# Patient Record
Sex: Female | Born: 1950 | Race: White | Hispanic: No | Smoking: Former smoker
Health system: Southern US, Community
[De-identification: ages and names within clinical notes are randomized; demographics above are authoritative.]

## PROBLEM LIST (undated history)

## (undated) DIAGNOSIS — I639 Cerebral infarction, unspecified: Secondary | ICD-10-CM

## (undated) DIAGNOSIS — E559 Vitamin D deficiency, unspecified: Secondary | ICD-10-CM

## (undated) DIAGNOSIS — I5189 Other ill-defined heart diseases: Secondary | ICD-10-CM

## (undated) DIAGNOSIS — K219 Gastro-esophageal reflux disease without esophagitis: Secondary | ICD-10-CM

## (undated) DIAGNOSIS — J449 Chronic obstructive pulmonary disease, unspecified: Secondary | ICD-10-CM

## (undated) DIAGNOSIS — J45909 Unspecified asthma, uncomplicated: Secondary | ICD-10-CM

## (undated) DIAGNOSIS — I10 Essential (primary) hypertension: Secondary | ICD-10-CM

## (undated) DIAGNOSIS — H269 Unspecified cataract: Secondary | ICD-10-CM

## (undated) DIAGNOSIS — F17200 Nicotine dependence, unspecified, uncomplicated: Secondary | ICD-10-CM

## (undated) DIAGNOSIS — F0390 Unspecified dementia without behavioral disturbance: Secondary | ICD-10-CM

## (undated) HISTORY — DX: Vitamin D deficiency, unspecified: E55.9

## (undated) HISTORY — PX: ABDOMINAL HYSTERECTOMY: SHX81

## (undated) HISTORY — DX: Gastro-esophageal reflux disease without esophagitis: K21.9

## (undated) HISTORY — PX: PARTIAL HYMENECTOMY: SHX327

## (undated) HISTORY — DX: Cerebral infarction, unspecified: I63.9

## (undated) HISTORY — DX: Unspecified cataract: H26.9

---

## 2002-03-25 ENCOUNTER — Encounter: Payer: Self-pay | Admitting: Emergency Medicine

## 2002-03-25 ENCOUNTER — Inpatient Hospital Stay (HOSPITAL_COMMUNITY): Admission: EM | Admit: 2002-03-25 | Discharge: 2002-03-27 | Payer: Self-pay | Admitting: Emergency Medicine

## 2004-01-12 ENCOUNTER — Emergency Department: Payer: Self-pay | Admitting: General Practice

## 2011-01-06 ENCOUNTER — Inpatient Hospital Stay: Payer: Self-pay | Admitting: Surgery

## 2011-01-16 ENCOUNTER — Inpatient Hospital Stay: Payer: Self-pay | Admitting: Surgery

## 2011-01-19 LAB — PATHOLOGY REPORT

## 2011-02-02 HISTORY — PX: HERNIA REPAIR: SHX51

## 2011-08-21 ENCOUNTER — Emergency Department: Payer: Self-pay | Admitting: Emergency Medicine

## 2012-09-06 ENCOUNTER — Emergency Department: Payer: Self-pay | Admitting: Emergency Medicine

## 2014-05-26 NOTE — Consult Note (Signed)
PATIENT NAME:  Lauren Koch, Lauren Koch MR#:  [id number] DATE OF BIRTH:  Sep 17, 1950  DATE OF CONSULTATION:  01/06/2011  REFERRING PHYSICIAN:  Dr. Cecelia ByarsHashmi  CONSULTING PHYSICIAN:  Fredia SorrowAbhinav Teigen Parslow, MD  PRIMARY CARE PHYSICIAN: None.   REASON FOR CONSULTATION: Preop assessment and management of hypertension and wheezing.   CHIEF COMPLAINT: "I had a funny feeling in my stomach."   HISTORY OF PRESENT ILLNESS: 64 year old female who is a smoker, smoking a pack per day for more than 30 years, history of asthma, chronic obstructive pulmonary disease, not using any inhalers at home, also history of hypertension, not using any medication, has not seen a physician for many years now; previously she was seeing Dr. Alphonsus SiasLetvak. Today she presented to the Emergency Room because of abdominal pain. She says it is mainly in the lower abdomen on the left side. It feels like a knot. She had a hernia for a long time but she had a funny feeling in the abdomen. She was also nauseous, dry heaving, but she did not vomit. She had a bowel movement today. She was having significant pain in her abdomen. She said she coughs all the time because of smoking. She also has wheezing and she says she is wheezing pretty much all the time. She has shortness of breath on exertion but she denies any chest pain, any headache, any dizziness. When she came into the Emergency Room she had a blood pressure of 245/107 and she was saturating 90% on room air. She had audible wheezing. She received a DuoNeb in the Emergency Room. She got one dose of IV labetalol also and she got 1 liter of normal saline bolus. She feels extremely dry. Dr. Cecelia ByarsHashmi saw her in the Emergency Room and she is being admitted under surgery for incarcerated hernia. He tried to reduce the hernia in the Emergency Room. Patient is to be kept n.p.o. because patient might need to go to the Operating Room tonight.  REVIEW OF SYSTEMS: She denies any fever, chills, or weakness. No acute change in  vision. No headache. No dizziness. Complaining of cough, dyspnea, chronic obstructive pulmonary disease. No pleuritic chest pain. No chest pain. She has dyspnea on exertion. No palpitations or syncope. She is complaining of nausea, abdominal pain. No GI bleed. No dysuria. No frequency. No anemia. No easy bruisability. No bleeding from any site. No thyroid problems. No rash. No joint pains. No swelling. No focal numbness or weakness. No anxiety.   PAST MEDICAL HISTORY:  1. Hypertension. 2. Asthma. 3. Chronic obstructive pulmonary disease. 4. No cardiac history in the past. She used to see Dr. Alphonsus SiasLetvak in the past but she has not seen him for many years.  HOME MEDICATIONS: None. Previously she was using albuterol and Advair in the past.   PAST SURGICAL HISTORY:  1. Cesarean section.  2. She had one of the ovaries removed, she thinks it is a right oophorectomy.   ALLERGIES TO MEDICATIONS: Prednisone.   SOCIAL HISTORY: She [place] in [place]. She smokes about a pack per day for more than 30 years. No alcohol or drug use.   FAMILY HISTORY: Her dad had myocardial infarction. Mother had heart disease also.   PHYSICAL EXAMINATION:  VITAL SIGNS: Her vitals when she presented to the Emergency Room: Temperature 97.8, heart rate 79, respiratory rate 18, blood pressure 245/107, saturating 90% on room air. Currently, heart rate is 70, blood pressure 177/88, saturating 97% on 2 liters nasal cannula.   GENERAL: This is a  middle-aged morbidly obese Caucasian female. She is lying in bed having audible wheezing at this time.   HEENT: Bilateral pupils are equal. Extraocular muscles are intact. No scleral icterus. No conjunctivitis. Oral mucosa completely dry. She has halitosis.   NECK: No thyroid tenderness, enlargement or nodules. Neck is supple. No masses, nontender. No adenopathy. No JVD. No carotid bruit.   CHEST: She has bilateral wheezing, audible wheezing, increased respiratory  effort. Not using accessory muscles of respiration.   HEART: Heart sounds are regular. No murmur. Peripheral pulses 1+. She has nonpitting edema.   ABDOMEN: Slightly distended. She has a ventral hernia in the left side. I could not reduce it. This is on the left of the scar. She has a scar from previous cesarean section in lower abdomen infraumbilical. She has tinkling bowel sounds, hypoactive. No guarding. No rigidity.   NEUROLOGIC: She is awake, alert, oriented to time, place, and person. Cranial nerves are intact. Moving all extremities against gravity.   EXTREMITIES: No cyanosis. No clubbing.   SKIN: No rash. No lesions.   LABORATORY, DIAGNOSTIC AND RADIOLOGICAL DATA: White count 8.9, hemoglobin 15.4, platelet count 256,000. BMP: Sodium 142, potassium 3.9, BUN 11, creatinine 0.83. LFTs are normal. Troponin negative. Lipase 62. Urinalysis shows negative nitrite, negative leukocyte esterase. Chest x-ray is negative; it shows hyperinflated lung fields. Her EKG shows that she has sinus rhythm, she has normal axis, nonspecific T wave changes.   IMPRESSION:  1. Preoperative assessment. 2. Chronic obstructive pulmonary disease exacerbation. 3. Malignant hypertension. 4. Tobacco abuse.  5. Noncompliance. 6. Incarcerated ventral hernia.   PLAN: A 64 year old female, morbidly obese, with history of hypertension, asthma, chronic obstructive pulmonary disease, she smokes about a pack per day. She presented with abdominal pain, found to have an incarcerated hernia. She is admitted by surgery. She may need surgical intervention tonight. She smokes about a pack per day. She is extensively wheezing. I am going to start her on DuoNeb every four hours. I am going to start her on IV Solu-Medrol though she said she is allergic to prednisone but considering her chronic obstructive pulmonary disease exacerbation she needs some Solu-Medrol at this time. She got one dose of 10 mg of IV labetalol in the Emergency  Room. I am going to put her on hydralazine IV p.r.n. Will avoid beta blockers at this time because of her severe wheezing. Her EKG does not show any acute ischemia, nonspecific T wave changes. No prior EKG to compare with. I advised her smoking cessation. She is deconditioned. She is not taking any medications at home. She has not seen a physician for many years. She may be needing urgent surgery but she is high cardiac risk for surgery. Will continue to follow the patient.  TIME SPENT WITH CONSULTATION: 50 minutes.   ____________________________ Fredia Sorrow, MD ag:cms Koch: 01/06/2011 20:26:31 ET T: 01/07/2011 10:35:03 ET JOB#: [id number]  cc: Fredia Sorrow, MD, <Dictator> Mark A. Egbert Garibaldi, MD  Fredia Sorrow MD ELECTRONICALLY SIGNED 02/06/2011 17:15

## 2014-05-26 NOTE — Discharge Summary (Signed)
PATIENT NAME:  Lauren Koch, Lauren Koch MR#:  [id number] DATE OF BIRTH:  [birth year]  DATE OF ADMISSION:  01/16/2011 DATE OF DISCHARGE:  01/19/2011  FINAL DIAGNOSES:  1. Incarcerated ventral hernia.  2. Obesity.  3. Severe asthma and chronic obstructive pulmonary disease.  4. Tobacco abuse and dependence.   PRINCIPLE PROCEDURES:  1. CT scan of the abdomen and pelvis.  2. Medicine consultation featuring [facility].  3. Repair of incarcerated ventral hernia with subfascial mesh and partial omentectomy on 01/17/2011.   HOSPITAL COURSE SUMMARY: The patient was admitted with recurrent incarceration of the ventral hernia. Dr. Anda KraftMarterre was able to successfully reduce the bowel component of this. Repeat CT scan demonstrated a large amount of omentum trapped within a large hernia sac with a small fascial defect. After appropriate medical consultation and optimization of her asthma and chronic obstructive pulmonary disease and preoperative evaluation informed consent, the patient was brought to the Operating Room on the 16th of December and the hernia was repaired with mesh. Postoperatively she had adequate pain control with intermittent wheezing, tolerating a regular diet. Her abdomen remained soft. Pain was well controlled. She was discharged home on postoperative day number two in stable condition with office follow-up in one week's time for staple removal. She is to continue her abdominal binder.   DISCHARGE MEDICATIONS:  1. Advair Diskus.  2. Norvasc 10 mg by mouth once a day.  3. Spiriva. 4. Dexamethasone 4 mg by mouth taper. 5. Norco 5/325 1 to 2 tabs p.o. every 4 to 6 hours p.r.n. pain.     DISCHARGE INSTRUCTIONS: Call the office with any questions or concerns.    ____________________________ Redge GainerMark A. Egbert GaribaldiBird, MD mab:rbg Koch: 02/01/2011 22:09:25 ET T: 02/03/2011 15:40:11 ET JOB#: [id number]  cc: Loraine LericheMark A. Egbert GaribaldiBird, MD, <Dictator> Jamell Opfer Kela MillinA Donnajean Chesnut MD ELECTRONICALLY SIGNED 02/04/2011 8:35

## 2014-06-08 ENCOUNTER — Emergency Department (HOSPITAL_COMMUNITY): Payer: Self-pay

## 2014-06-08 ENCOUNTER — Encounter (HOSPITAL_COMMUNITY): Payer: Self-pay | Admitting: Emergency Medicine

## 2014-06-08 ENCOUNTER — Emergency Department (HOSPITAL_COMMUNITY)
Admission: EM | Admit: 2014-06-08 | Discharge: 2014-06-08 | Disposition: A | Discharge status: Home / Self Care | Payer: Self-pay | Attending: Emergency Medicine | Admitting: Emergency Medicine

## 2014-06-08 DIAGNOSIS — J441 Chronic obstructive pulmonary disease with (acute) exacerbation: Secondary | ICD-10-CM | POA: Insufficient documentation

## 2014-06-08 DIAGNOSIS — J209 Acute bronchitis, unspecified: Secondary | ICD-10-CM | POA: Insufficient documentation

## 2014-06-08 DIAGNOSIS — I10 Essential (primary) hypertension: Secondary | ICD-10-CM | POA: Insufficient documentation

## 2014-06-08 DIAGNOSIS — Z720 Tobacco use: Secondary | ICD-10-CM | POA: Insufficient documentation

## 2014-06-08 DIAGNOSIS — Z79899 Other long term (current) drug therapy: Secondary | ICD-10-CM | POA: Insufficient documentation

## 2014-06-08 DIAGNOSIS — J40 Bronchitis, not specified as acute or chronic: Secondary | ICD-10-CM

## 2014-06-08 HISTORY — DX: Chronic obstructive pulmonary disease, unspecified: J44.9

## 2014-06-08 HISTORY — DX: Unspecified asthma, uncomplicated: J45.909

## 2014-06-08 HISTORY — DX: Essential (primary) hypertension: I10

## 2014-06-08 LAB — COMPREHENSIVE METABOLIC PANEL
ALBUMIN: 3.7 g/dL (ref 3.5–5.0)
ALK PHOS: 106 U/L (ref 38–126)
ALT: 13 U/L — ABNORMAL LOW (ref 14–54)
AST: 20 U/L (ref 15–41)
Anion gap: 11 (ref 5–15)
BILIRUBIN TOTAL: 0.5 mg/dL (ref 0.3–1.2)
BUN: 8 mg/dL (ref 6–20)
CO2: 25 mmol/L (ref 22–32)
Calcium: 9 mg/dL (ref 8.9–10.3)
Chloride: 103 mmol/L (ref 101–111)
Creatinine, Ser: 0.79 mg/dL (ref 0.44–1.00)
GFR calc Af Amer: >60 mL/min (ref >60)
Glucose, Bld: 111 mg/dL — ABNORMAL HIGH (ref 70–99)
POTASSIUM: 3.7 mmol/L (ref 3.5–5.1)
Sodium: 139 mmol/L (ref 135–145)
Total Protein: 7.7 g/dL (ref 6.5–8.1)

## 2014-06-08 LAB — CBC WITH DIFFERENTIAL/PLATELET
Basophils Absolute: 0 10*3/uL (ref 0.0–0.1)
Basophils Relative: 0 % (ref 0–1)
EOS PCT: 2 % (ref 0–5)
Eosinophils Absolute: 0.2 10*3/uL (ref 0.0–0.7)
HCT: 38.1 % (ref 36.0–46.0)
Hemoglobin: 12 g/dL (ref 12.0–15.0)
Lymphocytes Relative: 13 % (ref 12–46)
Lymphs Abs: 1.5 10*3/uL (ref 0.7–4.0)
MCH: 24 pg — ABNORMAL LOW (ref 26.0–34.0)
MCHC: 31.5 g/dL (ref 30.0–36.0)
MCV: 76.4 fL — ABNORMAL LOW (ref 78.0–100.0)
MONO ABS: 1.1 10*3/uL — AB (ref 0.1–1.0)
Monocytes Relative: 10 % (ref 3–12)
NEUTROS ABS: 8.1 10*3/uL — AB (ref 1.7–7.7)
Neutrophils Relative %: 75 % (ref 43–77)
Platelets: 377 10*3/uL (ref 150–400)
RBC: 4.99 MIL/uL (ref 3.87–5.11)
RDW: 16.6 % — ABNORMAL HIGH (ref 11.5–15.5)
WBC: 10.9 10*3/uL — ABNORMAL HIGH (ref 4.0–10.5)

## 2014-06-08 LAB — BRAIN NATRIURETIC PEPTIDE: B Natriuretic Peptide: 109 pg/mL — ABNORMAL HIGH (ref 0.0–100.0)

## 2014-06-08 LAB — TROPONIN I

## 2014-06-08 MED ORDER — ALBUTEROL SULFATE (2.5 MG/3ML) 0.083% IN NEBU
2.5000 mg | INHALATION_SOLUTION | Freq: Once | RESPIRATORY_TRACT | Status: AC
  Administered 2014-06-08: 2.5 mg via RESPIRATORY_TRACT
  Filled 2014-06-08: qty 3

## 2014-06-08 MED ORDER — IPRATROPIUM-ALBUTEROL 0.5-2.5 (3) MG/3ML IN SOLN
3.0000 mL | Freq: Once | RESPIRATORY_TRACT | Status: AC
  Administered 2014-06-08: 3 mL via RESPIRATORY_TRACT
  Filled 2014-06-08: qty 3

## 2014-06-08 MED ORDER — ALBUTEROL SULFATE HFA 108 (90 BASE) MCG/ACT IN AERS
2.0000 | INHALATION_SPRAY | RESPIRATORY_TRACT | Status: DC | PRN
  Administered 2014-06-08: 2 via RESPIRATORY_TRACT
  Filled 2014-06-08: qty 6.7

## 2014-06-08 MED ORDER — AZITHROMYCIN 250 MG PO TABS
500.0000 mg | ORAL_TABLET | Freq: Once | ORAL | Status: AC
  Administered 2014-06-08: 500 mg via ORAL
  Filled 2014-06-08: qty 2

## 2014-06-08 MED ORDER — AZITHROMYCIN 250 MG PO TABS
ORAL_TABLET | ORAL | Status: DC
Start: 2014-06-08 — End: 2015-03-03

## 2014-06-08 NOTE — ED Notes (Signed)
Resp in room giving breathing treatment.

## 2014-06-08 NOTE — Discharge Instructions (Signed)
Use your inhaler every 4-6 hours.  Follow up with your md this week.

## 2014-06-08 NOTE — ED Provider Notes (Signed)
CSN: [id number]     Arrival date & time 06/08/14  1728 History   First MD Initiated Contact with Patient 06/08/14 1738     Chief Complaint  Patient presents with  . Cough     (Consider location/radiation/quality/duration/timing/severity/associated sxs/prior Treatment) Patient is a 64 y.o. female presenting with cough. The history is provided by the patient (pt complains of cough and sob).  Cough Cough characteristics:  Non-productive Severity:  Moderate Onset quality:  Sudden Timing:  Constant Progression:  Waxing and waning Chronicity:  New Associated symptoms: wheezing   Associated symptoms: no chest pain, no eye discharge, no headaches and no rash     Past Medical History  Diagnosis Date  . Hypertension   . COPD (chronic obstructive pulmonary disease)   . Asthma    Past Surgical History  Procedure Laterality Date  . Hernia repair    . Cesarean section    . Partial hymenectomy     Family History  Problem Relation Age of Onset  . Diabetes Other    History  Substance Use Topics  . Smoking status: Current Every Day Smoker -- 0.50 packs/day for 30 years    Types: Cigarettes  . Smokeless tobacco: Never Used  . Alcohol Use: No   OB History    Gravida Para Term Preterm AB TAB SAB Ectopic Multiple Living   2 2 2       2      Review of Systems  Constitutional: Negative for appetite change and fatigue.  HENT: Negative for congestion, ear discharge and sinus pressure.   Eyes: Negative for discharge.  Respiratory: Positive for cough and wheezing.   Cardiovascular: Negative for chest pain.  Gastrointestinal: Negative for abdominal pain and diarrhea.  Genitourinary: Negative for frequency and hematuria.  Musculoskeletal: Negative for back pain.  Skin: Negative for rash.  Neurological: Negative for seizures and headaches.  Psychiatric/Behavioral: Negative for hallucinations.      Allergies  Prednisone  Home Medications   Prior to Admission medications    Medication Sig Start Date End Date Taking? Authorizing Provider  diphenhydrAMINE (BENADRYL) 25 MG tablet Take 25 mg by mouth every 6 (six) hours as needed for sleep.   Yes Historical Provider, MD  lisinopril (PRINIVIL,[name] Take 20 mg by mouth at bedtime.   Yes Historical Provider, MD  azithromycin (ZITHROMAX) 250 MG tablet Take one pill a day 06/08/14   Bethann BerkshireJoseph Symantha Steeber, MD   BP 142/97 mmHg  Pulse 103  Temp(Src) 97.8 F (36.6 C) (Oral)  Resp 18  Ht 5' (1.524 m)  Wt 200 lb (90.719 kg)  BMI 39.06 kg/m2  SpO2 98% Physical Exam  Constitutional: She is oriented to person, place, and time. She appears well-developed.  HENT:  Head: Normocephalic.  Eyes: Conjunctivae and EOM are normal. No scleral icterus.  Neck: Neck supple. No thyromegaly present.  Cardiovascular: Normal rate and regular rhythm.  Exam reveals no gallop and no friction rub.   No murmur heard. Pulmonary/Chest: No stridor. She has wheezes. She has no rales. She exhibits no tenderness.  Abdominal: She exhibits no distension. There is no tenderness. There is no rebound.  Musculoskeletal: Normal range of motion. She exhibits no edema.  Lymphadenopathy:    She has no cervical adenopathy.  Neurological: She is oriented to person, place, and time. She exhibits normal muscle tone. Coordination normal.  Skin: No rash noted. No erythema.  Psychiatric: She has a normal mood and affect. Her behavior is normal.    ED Course  Procedures (including critical care time) Labs Review Labs Reviewed  CBC WITH DIFFERENTIAL/PLATELET - Abnormal; Notable for the following:    WBC 10.9 (*)    MCV 76.4 (*)    MCH 24.0 (*)    RDW 16.6 (*)    Neutro Abs 8.1 (*)    Monocytes Absolute 1.1 (*)    All other components within normal limits  COMPREHENSIVE METABOLIC PANEL - Abnormal; Notable for the following:    Glucose, Bld 111 (*)    ALT 13 (*)    All other components within normal limits  BRAIN NATRIURETIC PEPTIDE - Abnormal;  Notable for the following:    B Natriuretic Peptide 109.0 (*)    All other components within normal limits  TROPONIN I    Imaging Review Dg Chest Portable 1 View  06/08/2014   CLINICAL DATA:  Nonproductive cough beginning yesterday, shortness of breath, vomiting with coughing spells, audible wheezing, history COPD, home oxygen  EXAM: PORTABLE CHEST - 1 VIEW  COMPARISON:  Portable exam 1753 hr compared to 01/06/2011  FINDINGS: Upper normal heart size.  Normal mediastinal contours and pulmonary vascularity.  Lungs grossly clear.  Pleural effusion or pneumothorax.  Bones unremarkable.  IMPRESSION: No acute abnormalities.   Electronically Signed   By: Ulyses SouthwardMark  Boles M.D.   On: 06/08/2014 18:17     EKG Interpretation None      MDM   Final diagnoses:  Bronchitis    Bronchitis and bronchospasm.  tx with zpak and albuterol with follow up     Bethann BerkshireJoseph Jay Kempe, MD 06/08/14 2006

## 2014-06-08 NOTE — ED Notes (Signed)
Patient c/o nonproductive cough that started yesterday. Per patient shortness of breath and vomiting with "coughing spells." Audible wheezing noted.HX of Copd. Per patient wears oxygen at home when needed. Per patient chest sore only with cough. Patient also reports sore throat and sinus pressure with headache. Denies any fevers.

## 2015-02-28 ENCOUNTER — Encounter: Payer: Self-pay | Admitting: Family Medicine

## 2015-02-28 DIAGNOSIS — H269 Unspecified cataract: Secondary | ICD-10-CM | POA: Insufficient documentation

## 2015-02-28 DIAGNOSIS — J449 Chronic obstructive pulmonary disease, unspecified: Secondary | ICD-10-CM | POA: Insufficient documentation

## 2015-02-28 DIAGNOSIS — I10 Essential (primary) hypertension: Secondary | ICD-10-CM | POA: Insufficient documentation

## 2015-02-28 DIAGNOSIS — K219 Gastro-esophageal reflux disease without esophagitis: Secondary | ICD-10-CM | POA: Insufficient documentation

## 2015-02-28 DIAGNOSIS — J45909 Unspecified asthma, uncomplicated: Secondary | ICD-10-CM | POA: Insufficient documentation

## 2015-03-03 ENCOUNTER — Ambulatory Visit (INDEPENDENT_AMBULATORY_CARE_PROVIDER_SITE_OTHER): Admitting: Physician Assistant

## 2015-03-03 ENCOUNTER — Encounter: Payer: Self-pay | Admitting: Physician Assistant

## 2015-03-03 VITALS — BP 184/116 | HR 68 | Temp 97.4°F | Resp 20 | Ht 59.5 in | Wt 208.0 lb

## 2015-03-03 DIAGNOSIS — J4520 Mild intermittent asthma, uncomplicated: Secondary | ICD-10-CM

## 2015-03-03 DIAGNOSIS — Z720 Tobacco use: Secondary | ICD-10-CM

## 2015-03-03 DIAGNOSIS — I10 Essential (primary) hypertension: Secondary | ICD-10-CM

## 2015-03-03 DIAGNOSIS — J439 Emphysema, unspecified: Secondary | ICD-10-CM

## 2015-03-03 DIAGNOSIS — K219 Gastro-esophageal reflux disease without esophagitis: Secondary | ICD-10-CM

## 2015-03-03 DIAGNOSIS — F17200 Nicotine dependence, unspecified, uncomplicated: Secondary | ICD-10-CM

## 2015-03-03 MED ORDER — VARENICLINE TARTRATE 0.5 MG PO TABS
0.5000 mg | ORAL_TABLET | Freq: Two times a day (BID) | ORAL | Status: DC
Start: 2015-03-03 — End: 2015-03-17

## 2015-03-03 MED ORDER — VARENICLINE TARTRATE 1 MG PO TABS
1.0000 mg | ORAL_TABLET | Freq: Two times a day (BID) | ORAL | Status: DC
Start: 2015-03-03 — End: 2015-03-17

## 2015-03-03 MED ORDER — HYDROCHLOROTHIAZIDE 25 MG PO TABS: 25.0000 mg | ORAL_TABLET | Freq: Every day | ORAL | Status: DC

## 2015-03-03 MED ORDER — LISINOPRIL 20 MG PO TABS: 20.0000 mg | ORAL_TABLET | Freq: Every day | ORAL | Status: DC

## 2015-03-03 MED ORDER — ALBUTEROL SULFATE HFA 108 (90 BASE) MCG/ACT IN AERS: 2.0000 | INHALATION_SPRAY | Freq: Four times a day (QID) | RESPIRATORY_TRACT | Status: DC | PRN

## 2015-03-03 NOTE — Progress Notes (Signed)
Patient ID: [name] MRN: [id number], DOB: May 17, 1950, 65 y.o. Date of Encounter: @  Chief Complaint:  Chief Complaint  Patient presents with  . new pt est care    not CPE,  is fasting, not taking meds due to ran out    HPI: 65 y.o. year old white female  presents as a new patient to establish care.  She states that her last office visit with her provider was about a year ago. Says that she was unable to have follow-up because she was without insurance but she just got insurance so is here to establish care. Says that she was able to continue refilling her medicines until about 6 months ago. Has been off prescription medicines for about 6 months.  Regarding her smoking: Says that she currently smokes 1/2 - 1 pack per day. Says that it had been up to 2 packs per day in the past--- about 2 years ago was smoking that amount ( 2 ppd). Has been able to quit smoking off and on. Says that she started smoking around age 35. One time in the past 5 years she stopped smoking for about 2 years but then started back.  No complaints or concerns.   Past Medical History  Diagnosis Date  . Asthma   . Cataract   . COPD (chronic obstructive pulmonary disease) (HCC)   . Hypertension   . GERD (gastroesophageal reflux disease)      Home Meds: Outpatient Prescriptions Prior to Visit  Medication Sig Dispense Refill  . diphenhydrAMINE (BENADRYL) 25 MG tablet Take 25 mg by mouth every 6 (six) hours as needed for sleep.    Marland Kitchen azithromycin (ZITHROMAX) 250 MG tablet Take one pill a day 4 tablet 0  . hydrochlorothiazide (HYDRODIURIL) 25 MG tablet Take 25 mg by mouth daily. Reported on 03/03/2015    . lisinopril (PRINIVIL,[name] Take 20 mg by mouth at bedtime. Reported on 03/03/2015     No facility-administered medications prior to visit.    Allergies:  Allergies  Allergen Reactions  . Prednisone Other (See Comments)    arrhythmia     Social History   Social History   . Marital Status: Married    Spouse Name: N/A  . Number of Children: N/A  . Years of Education: N/A   Occupational History  . Not on file.   Social History Main Topics  . Smoking status: Current Every Day Smoker -- 0.50 packs/day for 30 years    Types: Cigarettes  . Smokeless tobacco: Never Used  . Alcohol Use: No  . Drug Use: No  . Sexual Activity: Not Currently    Birth Control/ Protection: None   Other Topics Concern  . Not on file   Social History Narrative    Family History  Problem Relation Age of Onset  . Diabetes Other   . Diabetes Mother   . Hypertension Mother   . Miscarriages / [place] Mother   . Heart disease Father   . Diabetes Father   . Hypertension Father   . Cancer Sister     skin     Review of Systems:  See HPI for pertinent ROS. All other ROS negative.    Physical Exam: Blood pressure 184/116, pulse 68, temperature 97.4 F (36.3 C), temperature source Oral, resp. rate 20, height 4' 11.5" (1.511 m), weight 208 lb (94.348 kg)., Body mass index is 41.32 kg/(m^2). General: Obese WF. Appears in no acute distress. Neck: Supple. No thyromegaly.  No lymphadenopathy. No carotid bruits. Lungs: She has wheezes diffusely bilaterally. Oxygen saturation 95% on room air. Heart: RRR with S1 S2. No murmurs, rubs, or gallops. Abdomen: Soft, non-tender, non-distended with normoactive bowel sounds. No hepatomegaly. No rebound/guarding. No obvious abdominal masses. Musculoskeletal:  Strength and tone normal for age. Extremities/Skin: Warm and dry.  No edema.  Neuro: Alert and oriented X 3. Moves all extremities spontaneously. Gait is normal. CNII-XII grossly in tact. Psych:  Responds to questions appropriately with a normal affect.     ASSESSMENT AND PLAN:  65 y.o.  year old female with  1. Smoker Discussed smoking cessation and medication for smoking cessation with patient and she is agreeable. Explained to her that she will start with the starting pack of  0.5 mg and when she completes this will go up to the continuing pack of 1 mg dose. - varenicline (CHANTIX) 0.5 MG tablet; Take 1 tablet (0.5 mg total) by mouth 2 (two) times daily.  Dispense: 30 tablet; Refill: 0 - varenicline (CHANTIX CONTINUING MONTH PAK) 1 MG tablet; Take 1 tablet (1 mg total) by mouth 2 (two) times daily.  Dispense: 60 tablet; Refill: 3  2. Asthma, mild intermittent, uncomplicated Discussed prednisone but she states that she cannot take prednisone. Asked her what kind of problem she had with it in the past she said that it caused her to be in the hospital for 2 weeks. Will avoid prednisone. Have her use albuterol. Hopefully she can quit smoking with the Chantix and her wheezing will resolve. Will Follow this up in her follow-up visit in 2 weeks and decide whether we need to add further inhaled medications. - albuterol (PROVENTIL HFA;[name]; [name]  Dispense: 1 Inhaler; Refill: 2  3. Pulmonary emphysema, unspecified emphysema type (HCC) Discussed prednisone but she states that she cannot take prednisone. Asked her what kind of problem she had with it in the past she said that it caused her to be in the hospital for 2 weeks. Will avoid prednisone. Have her use albuterol. Hopefully she can quit smoking with the Chantix and her wheezing will resolve. Will Follow this up in her follow-up visit in 2 weeks and decide whether we need to add further inhaled medications. - albuterol (PROVENTIL HFA;[name]; [name]  Dispense: 1 Inhaler; Refill: 2   4. Essential hypertension CMET was normal 06/08/14. Will restart prior blood pressure medications. She will return for follow-up visit here in 2 weeks. At that time will recheck blood pressure and  BMET on medications. - hydrochlorothiazide (HYDRODIURIL) 25 MG tablet; [name] Reported on 03/03/2015  Dispense: 30 tablet; Refill: 0 - lisinopril (PRINIVIL,[name]; Take 1 tablet (20 mg total) by mouth at bedtime. Reported on 03/03/2015  Dispense: 30 tablet; Refill: 0  5. Gastroesophageal reflux disease, esophagitis presence not specified  Follow-up office visit 2 weeks or sooner if needed.  Signed, [address] [place], [place], [facility] 03/03/2015 9:55 AM

## 2015-03-17 ENCOUNTER — Ambulatory Visit (INDEPENDENT_AMBULATORY_CARE_PROVIDER_SITE_OTHER): Admitting: Physician Assistant

## 2015-03-17 ENCOUNTER — Encounter: Payer: Self-pay | Admitting: Physician Assistant

## 2015-03-17 VITALS — BP 142/82 | HR 64 | Temp 97.4°F | Resp 20 | Wt 207.0 lb

## 2015-03-17 DIAGNOSIS — Z720 Tobacco use: Secondary | ICD-10-CM | POA: Diagnosis not present

## 2015-03-17 DIAGNOSIS — I10 Essential (primary) hypertension: Secondary | ICD-10-CM | POA: Diagnosis not present

## 2015-03-17 DIAGNOSIS — J439 Emphysema, unspecified: Secondary | ICD-10-CM | POA: Diagnosis not present

## 2015-03-17 DIAGNOSIS — F17200 Nicotine dependence, unspecified, uncomplicated: Secondary | ICD-10-CM

## 2015-03-17 LAB — BASIC METABOLIC PANEL WITH GFR
BUN: 18 mg/dL (ref 7–25)
CO2: 24 mmol/L (ref 20–31)
CREATININE: 0.89 mg/dL (ref 0.50–0.99)
Calcium: 9.3 mg/dL (ref 8.6–10.4)
Chloride: 102 mmol/L (ref 98–110)
GFR, Est African American: 80 mL/min (ref >=60)
GFR, Est Non African American: 69 mL/min (ref >=60)
Glucose, Bld: 94 mg/dL (ref 70–99)
POTASSIUM: 4.6 mmol/L (ref 3.5–5.3)
SODIUM: 140 mmol/L (ref 135–146)

## 2015-03-17 MED ORDER — BUPROPION HCL ER (SR) 150 MG PO TB12: ORAL_TABLET | ORAL | Status: DC

## 2015-03-17 MED ORDER — AMLODIPINE BESYLATE 5 MG PO TABS
5.0000 mg | ORAL_TABLET | Freq: Every day | ORAL | Status: DC
Start: 2015-03-17 — End: 2015-04-02

## 2015-03-17 MED ORDER — FLUTICASONE-SALMETEROL 250-50 MCG/DOSE IN AEPB
1.0000 | INHALATION_SPRAY | Freq: Two times a day (BID) | RESPIRATORY_TRACT | Status: DC
Start: 2015-03-17 — End: 2017-04-12

## 2015-03-17 NOTE — Progress Notes (Signed)
Patient ID: JAZARIA JARECKI MRN: [id number], DOB: [birth year], 65 y.o. Date of Encounter: @  Chief Complaint:  Chief Complaint  Patient presents with  . 2 wk follow up    didn't get chantix too expensive    HPI: 65 y.o. year old white female since for 2 week follow-up visit regarding hypertension and smoking cessation.  OV Note 03/03/2015:  65 y/o WF presents as a new patient to establish care.  She states that her last office visit with her provider was about a year ago. Says that she was unable to have follow-up because she was without insurance but she just got insurance so is here to establish care. Says that she was able to continue refilling her medicines until about 6 months ago. Has been off prescription medicines for about 6 months.  Regarding her smoking: Says that she currently smokes 1/2 - 1 pack per day. Says that it had been up to 2 packs per day in the past--- about 2 years ago was smoking that amount ( 2 ppd). Has been able to quit smoking off and on. Says that she started smoking around age 5. One time in the past 5 years she stopped smoking for about 2 years but then started back.  At that OV: --Prescribed Chantix starting pack and continuing Dosepak. --Prescribed albuterol inhaler. --Prescribed HCTZ 25 mg daily and lisinopril 20 mg daily--- which were her prior blood pressure medications. --Planned for follow-up office visit 2 weeks to recheck blood pressure and BMET on medications and follow-up smoking and COPD.    03/17/2015: She is taking the hydrochlorothiazide and lisinopril daily as directed. No adverse effects. She states that she was unable to get the Chantix because it was too expensive and was not covered by her insurance. Says that the cost was > $600. Says that she has used the albuterol inhaler some and it does help her breathing.  Past Medical History  Diagnosis Date  . Asthma   . Cataract   . COPD (chronic obstructive pulmonary  disease) (HCC)   . Hypertension   . GERD (gastroesophageal reflux disease)      Home Meds: Outpatient Prescriptions Prior to Visit  Medication Sig Dispense Refill  . albuterol (PROVENTIL HFA;[name] [name] 1 Inhaler 2  . diphenhydrAMINE (BENADRYL) 25 MG tablet Take 25 mg by mouth every 6 (six) hours as needed for sleep.    . hydrochlorothiazide (HYDRODIURIL) 25 MG tablet [name] Reported on 03/03/2015 30 tablet 0  . lisinopril (PRINIVIL,[name] Take 1 tablet (20 mg total) by mouth at bedtime. Reported on 03/03/2015 30 tablet 0  . varenicline (CHANTIX CONTINUING MONTH PAK) 1 MG tablet Take 1 tablet (1 mg total) by mouth 2 (two) times daily. (Patient not taking: Reported on 03/17/2015) 60 tablet 3  . varenicline (CHANTIX) 0.5 MG tablet Take 1 tablet (0.5 mg total) by mouth 2 (two) times daily. (Patient not taking: Reported on 03/17/2015) 30 tablet 0   No facility-administered medications prior to visit.    Allergies:  Allergies  Allergen Reactions  . Prednisone Other (See Comments)    arrhythmia     Social History   Social History  . Marital Status: Married    Spouse Name: N/A  . Number of Children: N/A  . Years of Education: N/A  Occupational History  . Not on file.   Social History Main Topics  . Smoking status: Current Every Day Smoker -- 0.50 packs/day for 30 years    Types: Cigarettes  . Smokeless tobacco: Never Used  . Alcohol Use: No  . Drug Use: No  . Sexual Activity: Not Currently    Birth Control/ Protection: None   Other Topics Concern  . Not on file   Social History Narrative    Family History  Problem Relation Age of Onset  . Diabetes Other   . Diabetes Mother   . Hypertension Mother   . Miscarriages / [place] Mother   . Heart disease Father   . Diabetes Father   .  Hypertension Father   . Cancer Sister     skin     Review of Systems:  See HPI for pertinent ROS. All other ROS negative.    Physical Exam: Blood pressure 142/82, pulse 64, temperature 97.4 F (36.3 C), temperature source Oral, resp. rate 20, weight 207 lb (93.895 kg)., Body mass index is 41.13 kg/(m^2).  At OV 03/17/2015---Repeat BP by me --on the Right---160/96 General: Obese WF. Appears in no acute distress. Neck: Supple. No thyromegaly. No lymphadenopathy. No carotid bruits. Lungs: She has wheezes diffusely bilaterally. Heart: RRR with S1 S2. No murmurs, rubs, or gallops. Abdomen: Soft, non-tender, non-distended with normoactive bowel sounds. No hepatomegaly. No rebound/guarding. No obvious abdominal masses. Musculoskeletal:  Strength and tone normal for age. Extremities/Skin: Warm and dry.  No edema.  Neuro: Alert and oriented X 3. Moves all extremities spontaneously. Gait is normal. CNII-XII grossly in tact. Psych:  Responds to questions appropriately with a normal affect.     ASSESSMENT AND PLAN:  65 y.o. year old female with   1. Pulmonary emphysema, unspecified emphysema type (HCC) Says that her insurance covers her blood pressure medications very well and she paid very little for those with that the Chantix was very expensive. Therefore seems that the insurance probably will cover generics and possibly older medications but not newer brand-name meds. Therefore,  prescribed Advair rather than Breo. Discussed with her that regardless of her breathing on certain days-- whether it is good or bad ---she uses this 1 puff twice daily. She voices understanding. She has significant wheezing on exam again today but as stated that last office visit she reports that she has contraindication to prednisone and cannot tolerate this. - Fluticasone-Salmeterol (ADVAIR DISKUS) 250-50 MCG/DOSE AEPB; Inhale 1 puff into the lungs 2 (two) times daily.  Dispense: 1 each; Refill: 3  2. Essential  hypertension Will check BMET to monitor since she started HCTZ and lisinopril last visit. Continue HCTZ 25 mg daily and continue the lisinopril 20 mg daily. Recheck blood pressure myself today and get 160/96 on the right. Will add Norvasc 5 mg daily. Will have her return for follow-up visit 2 weeks to recheck blood pressure and further adjust medicines as needed. - amLODipine (NORVASC) 5 MG tablet; Take 1 tablet (5 mg total) by mouth daily.  Dispense: 30 tablet; Refill: 0 - BASIC METABOLIC PANEL WITH GFR  3. Smoker States that insurance would not cover Chantix and it was going to cost > $600.  I checked the Chantix savings cards but itches says save up to $75 so this is not going to be helpful. Therefore will try Wellbutrin. She is to take the Wellbutrin as directed. If this medication causes any adverse effects and she is to stop the medication and call us. - buPROPion (  WELLBUTRIN SR) 150 MG 12 hr tablet; Take one daily for 5 days then take 1 twice a day  Dispense: 60 tablet; Refill: 4   Follow-up office visit 2 weeks or sooner if needed.  Signed, [address] [place], [place], [facility] 03/17/2015 9:01 AM

## 2015-03-19 ENCOUNTER — Encounter: Payer: Self-pay | Admitting: Family Medicine

## 2015-03-31 ENCOUNTER — Ambulatory Visit: Admitting: Physician Assistant

## 2015-04-02 ENCOUNTER — Encounter: Payer: Self-pay | Admitting: Physician Assistant

## 2015-04-02 ENCOUNTER — Ambulatory Visit (INDEPENDENT_AMBULATORY_CARE_PROVIDER_SITE_OTHER): Admitting: Physician Assistant

## 2015-04-02 VITALS — BP 132/88 | HR 82 | Temp 97.4°F | Resp 19 | Wt 206.0 lb

## 2015-04-02 DIAGNOSIS — K219 Gastro-esophageal reflux disease without esophagitis: Secondary | ICD-10-CM

## 2015-04-02 DIAGNOSIS — J4520 Mild intermittent asthma, uncomplicated: Secondary | ICD-10-CM

## 2015-04-02 DIAGNOSIS — F17200 Nicotine dependence, unspecified, uncomplicated: Secondary | ICD-10-CM

## 2015-04-02 DIAGNOSIS — I10 Essential (primary) hypertension: Secondary | ICD-10-CM

## 2015-04-02 DIAGNOSIS — H269 Unspecified cataract: Secondary | ICD-10-CM | POA: Diagnosis not present

## 2015-04-02 DIAGNOSIS — M545 Low back pain, unspecified: Secondary | ICD-10-CM

## 2015-04-02 DIAGNOSIS — J439 Emphysema, unspecified: Secondary | ICD-10-CM

## 2015-04-02 DIAGNOSIS — Z720 Tobacco use: Secondary | ICD-10-CM | POA: Diagnosis not present

## 2015-04-02 MED ORDER — BUPROPION HCL ER (SR) 150 MG PO TB12: ORAL_TABLET | ORAL | Status: DC

## 2015-04-02 MED ORDER — LISINOPRIL 20 MG PO TABS
20.0000 mg | ORAL_TABLET | Freq: Every day | ORAL | Status: DC
Start: 2015-04-02 — End: 2015-10-27

## 2015-04-02 MED ORDER — AMLODIPINE BESYLATE 5 MG PO TABS: 5.0000 mg | ORAL_TABLET | Freq: Every day | ORAL | Status: DC

## 2015-04-02 MED ORDER — HYDROCHLOROTHIAZIDE 25 MG PO TABS: 25.0000 mg | ORAL_TABLET | Freq: Every day | ORAL | Status: DC

## 2015-04-02 NOTE — Progress Notes (Signed)
Patient ID: DIAMON REDDINGER MRN: [id number], DOB: [birth year], 65 y.o. Date of Encounter: @  Chief Complaint:  Chief Complaint  Patient presents with  . Follow-up    bp medication  . Back Pain    x 1 week comes and goes, more if standing up too long or stand down to long    HPI: 65 y.o. year old white female since for 2 week follow-up visit regarding hypertension and smoking cessation.  OV Note 03/03/2015:  65 y/o WF presents as a new patient to establish care.  She states that her last office visit with her provider was about a year ago. Says that she was unable to have follow-up because she was without insurance but she just got insurance so is here to establish care. Says that she was able to continue refilling her medicines until about 6 months ago. Has been off prescription medicines for about 6 months.  Regarding her smoking: Says that she currently smokes 1/2 - 1 pack per day. Says that it had been up to 2 packs per day in the past--- about 2 years ago was smoking that amount ( 2 ppd). Has been able to quit smoking off and on. Says that she started smoking around age 10. One time in the past 5 years she stopped smoking for about 2 years but then started back.  At that OV: --Prescribed Chantix starting pack and continuing Dosepak. --Prescribed albuterol inhaler. --Prescribed HCTZ 25 mg daily and lisinopril 20 mg daily--- which were her prior blood pressure medications. --Planned for follow-up office visit 2 weeks to recheck blood pressure and BMET on medications and follow-up smoking and COPD.    03/17/2015: She is taking the hydrochlorothiazide and lisinopril daily as directed. No adverse effects. She states that she was unable to get the Chantix because it was too expensive and was not covered by her insurance. Says that the cost was > $600. Says that she has used the albuterol inhaler some and it does help her breathing.  At that OV: BP was still elevated.  Norvasc  QD was added.  For smoking cessation, Wellbutrin was added.  For COPD--Advair was added.  Today she reports that she has added all 3 of these medications and is taking as directed and having no adverse effects. Says that she has already noticed that now she just takes 2 or 3 puffs off of a cigarette and then doesn't want anymore. Today she reports that she has been having some bilateral low back pain. Says that she has had back pain in the past but then have resolved but then just reoccurred over the past 2 weeks. Says that it bothers her if she sits for a long amount of time or if she has to stand for a long amount of time. If she is walking or changing positions, then does not feel much pain. Radiation down both thighs but no sciatica all the way down either leg. No other complaints or concerns today.   Past Medical History  Diagnosis Date  . Asthma   . Cataract   . COPD (chronic obstructive pulmonary disease) (HCC)   . Hypertension   . GERD (gastroesophageal reflux disease)      Home Meds: Outpatient Prescriptions Prior to Visit  Medication Sig Dispense Refill  . albuterol (PROVENTIL HFA;[name] [name] 1 Inhaler 2  .  amLODipine (NORVASC) 5 MG tablet Take 1 tablet (5 mg total) by mouth daily. 30 tablet 0  . buPROPion (WELLBUTRIN SR) 150 MG 12 hr tablet Take one daily for 5 days then take 1 twice a day 60 tablet 4  . diphenhydrAMINE (BENADRYL) 25 MG tablet Take 25 mg by mouth every 6 (six) hours as needed for sleep.    Marland Kitchen Fluticasone-Salmeterol (ADVAIR DISKUS) 250-50 MCG/DOSE AEPB Inhale 1 puff into the lungs 2 (two) times daily. 1 each 3  . hydrochlorothiazide (HYDRODIURIL) 25 MG tablet [name] Reported on 03/03/2015 30 tablet 0  . lisinopril (PRINIVIL,[name] Take 1 tablet (20 mg total) by mouth at  bedtime. Reported on 03/03/2015 30 tablet 0   No facility-administered medications prior to visit.    Allergies:  Allergies  Allergen Reactions  . Prednisone Other (See Comments)    arrhythmia     Social History   Social History  . Marital Status: Married    Spouse Name: N/A  . Number of Children: N/A  . Years of Education: N/A   Occupational History  . Not on file.   Social History Main Topics  . Smoking status: Current Every Day Smoker -- 0.50 packs/day for 30 years    Types: Cigarettes  . Smokeless tobacco: Never Used  . Alcohol Use: No  . Drug Use: No  . Sexual Activity: Not Currently    Birth Control/ Protection: None   Other Topics Concern  . Not on file   Social History Narrative    Family History  Problem Relation Age of Onset  . Diabetes Other   . Diabetes Mother   . Hypertension Mother   . Miscarriages / [place] Mother   . Heart disease Father   . Diabetes Father   . Hypertension Father   . Cancer Sister     skin     Review of Systems:  See HPI for pertinent ROS. All other ROS negative.    Physical Exam: Blood pressure 130/88, pulse 82, temperature 97.4 F (36.3 C), temperature source Oral, resp. rate 19, weight 206 lb (93.441 kg)., Body mass index is 40.93 kg/(m^2).  At OV 03/17/2015---Repeat BP by me --on the Right---160/96 General: Obese WF. Appears in no acute distress. Neck: Supple. No thyromegaly. No lymphadenopathy. No carotid bruits. Lungs: decreased, distant breath sounds, but clear.  Heart: RRR with S1 S2. No murmurs, rubs, or gallops. Abdomen: Soft, non-tender, non-distended with normoactive bowel sounds. No hepatomegaly. No rebound/guarding. No obvious abdominal masses. Musculoskeletal:  Strength and tone normal for age. Extremities/Skin: Warm and dry.  No edema.  Neuro: Alert and oriented X 3. Moves all extremities spontaneously. Gait is normal. CNII-XII grossly in tact. Psych:  Responds to questions appropriately with a  normal affect.     ASSESSMENT AND PLAN:  65 y.o. year old female with   1. Pulmonary emphysema, unspecified emphysema type (HCC) Cont Advair.  Her Insurance seems to cover generic medications but not new, brand name medicines. Therefore used Advair rather than one of the newer medications.  2. Essential hypertension Blood Pressure is now at goal. Continue current blood pressure medications without further change. She already had follow-up BMET 03/17/15. No further lab work necessary  today.  3. Smoker Now on wellbutrin for cessation.  Patient data she develops adverse effects and call us. Discussed with her that she has to put out some effort in trying to stop smoking but this will help.  4. Asthma, mild intermittent, uncomplicated Stable   Gastroesophageal reflux disease, esophagitis presence not specified --stable/controlled  Bilateral low back pain without sciatica Will obtain XRay. Follow up with patient once again these results. - DG Lumbar Spine Complete; Future  Discussed returning for complete physical exam and she is agreeable to do so. Scheduled for early morning so she can come fasting for labs.    Signed, [address] [place], [place], [facility] 04/02/2015 11:45 AM

## 2015-04-04 ENCOUNTER — Other Ambulatory Visit: Payer: Self-pay | Admitting: Family Medicine

## 2015-04-04 ENCOUNTER — Ambulatory Visit
Admission: RE | Admit: 2015-04-04 | Discharge: 2015-04-04 | Disposition: A | Discharge status: Home / Self Care | Source: Ambulatory Visit | Attending: Physician Assistant | Admitting: Physician Assistant

## 2015-04-04 DIAGNOSIS — M545 Low back pain, unspecified: Secondary | ICD-10-CM

## 2015-04-04 MED ORDER — CYCLOBENZAPRINE HCL 10 MG PO TABS: 10.0000 mg | ORAL_TABLET | Freq: Three times a day (TID) | ORAL | Status: DC | PRN

## 2015-04-16 ENCOUNTER — Ambulatory Visit (INDEPENDENT_AMBULATORY_CARE_PROVIDER_SITE_OTHER): Admitting: Physician Assistant

## 2015-04-16 ENCOUNTER — Encounter: Payer: Self-pay | Admitting: Physician Assistant

## 2015-04-16 ENCOUNTER — Telehealth: Payer: Self-pay | Admitting: Family Medicine

## 2015-04-16 VITALS — BP 102/66 | HR 60 | Temp 97.6°F | Resp 18 | Ht 60.0 in | Wt 206.0 lb

## 2015-04-16 DIAGNOSIS — K219 Gastro-esophageal reflux disease without esophagitis: Secondary | ICD-10-CM

## 2015-04-16 DIAGNOSIS — Z720 Tobacco use: Secondary | ICD-10-CM | POA: Diagnosis not present

## 2015-04-16 DIAGNOSIS — Z0000 Encounter for general adult medical examination without abnormal findings: Secondary | ICD-10-CM | POA: Diagnosis not present

## 2015-04-16 DIAGNOSIS — Z780 Asymptomatic menopausal state: Secondary | ICD-10-CM | POA: Diagnosis not present

## 2015-04-16 DIAGNOSIS — F17200 Nicotine dependence, unspecified, uncomplicated: Secondary | ICD-10-CM

## 2015-04-16 DIAGNOSIS — Z23 Encounter for immunization: Secondary | ICD-10-CM

## 2015-04-16 DIAGNOSIS — I10 Essential (primary) hypertension: Secondary | ICD-10-CM

## 2015-04-16 DIAGNOSIS — E2839 Other primary ovarian failure: Secondary | ICD-10-CM | POA: Diagnosis not present

## 2015-04-16 DIAGNOSIS — J439 Emphysema, unspecified: Secondary | ICD-10-CM

## 2015-04-16 LAB — COMPLETE METABOLIC PANEL WITH GFR
ALT: 10 U/L (ref 6–29)
AST: 16 U/L (ref 10–35)
Albumin: 3.7 g/dL (ref 3.6–5.1)
Alkaline Phosphatase: 88 U/L (ref 33–130)
BUN: 14 mg/dL (ref 7–25)
CO2: 25 mmol/L (ref 20–31)
Calcium: 9.2 mg/dL (ref 8.6–10.4)
Chloride: 100 mmol/L (ref 98–110)
Creat: 1.03 mg/dL — ABNORMAL HIGH (ref 0.50–0.99)
GFR, Est African American: 66 mL/min (ref >=60)
GFR, Est Non African American: 58 mL/min — ABNORMAL LOW (ref >=60)
GLUCOSE: 85 mg/dL (ref 70–99)
POTASSIUM: 4.6 mmol/L (ref 3.5–5.3)
SODIUM: 137 mmol/L (ref 135–146)
Total Bilirubin: 0.3 mg/dL (ref 0.2–1.2)
Total Protein: 6.9 g/dL (ref 6.1–8.1)

## 2015-04-16 LAB — TSH: TSH: 2.31 m[IU]/L

## 2015-04-16 LAB — CBC WITH DIFFERENTIAL/PLATELET
BASOS PCT: 0 % (ref 0–1)
Basophils Absolute: 0 10*3/uL (ref 0.0–0.1)
EOS PCT: 3 % (ref 0–5)
Eosinophils Absolute: 0.2 10*3/uL (ref 0.0–0.7)
HCT: 40.7 % (ref 36.0–46.0)
Hemoglobin: 12.9 g/dL (ref 12.0–15.0)
Lymphocytes Relative: 24 % (ref 12–46)
Lymphs Abs: 1.5 10*3/uL (ref 0.7–4.0)
MCH: 24.4 pg — ABNORMAL LOW (ref 26.0–34.0)
MCHC: 31.7 g/dL (ref 30.0–36.0)
MCV: 77.1 fL — ABNORMAL LOW (ref 78.0–100.0)
MONO ABS: 0.7 10*3/uL (ref 0.1–1.0)
MPV: 9 fL (ref 8.6–12.4)
Monocytes Relative: 11 % (ref 3–12)
Neutro Abs: 3.8 10*3/uL (ref 1.7–7.7)
Neutrophils Relative %: 62 % (ref 43–77)
Platelets: 374 10*3/uL (ref 150–400)
RBC: 5.28 MIL/uL — AB (ref 3.87–5.11)
RDW: 18.7 % — AB (ref 11.5–15.5)
WBC: 6.2 10*3/uL (ref 4.0–10.5)

## 2015-04-16 LAB — LIPID PANEL
CHOL/HDL RATIO: 3 ratio (ref <=5.0)
CHOLESTEROL: 206 mg/dL — AB (ref 125–200)
HDL: 69 mg/dL (ref >=46)
LDL Cholesterol: 121 mg/dL (ref <130)
Triglycerides: 80 mg/dL (ref <150)
VLDL: 16 mg/dL (ref <30)

## 2015-04-16 MED ORDER — OMEPRAZOLE 20 MG PO CPDR: 20.0000 mg | DELAYED_RELEASE_CAPSULE | Freq: Every day | ORAL | Status: DC

## 2015-04-16 MED ORDER — TIOTROPIUM BROMIDE MONOHYDRATE 18 MCG IN CAPS: 18.0000 ug | ORAL_CAPSULE | Freq: Every day | RESPIRATORY_TRACT | Status: DC

## 2015-04-16 NOTE — Telephone Encounter (Signed)
-----   Message from Lauren ReevesKatie M Robertson sent at 04/16/2015 10:51 AM EDT ----- Pt called back to let you know that her insurance will cover the shingles vaccine

## 2015-04-16 NOTE — Telephone Encounter (Signed)
Pt said her insurance told her they will cover the Zostavax here or at pharmacy.  Told patient it was her choice.  She can just drop back by office or go to pharmacy at her convenience and have vaccine given.

## 2015-04-16 NOTE — Addendum Note (Signed)
Addended by: Donne AnonPLUMMER, Kasidy Gianino M on: 04/16/2015 09:31 AM   Modules accepted: Orders

## 2015-04-16 NOTE — Progress Notes (Signed)
Patient ID: Lauren Koch MRN: [id number], DOB: 1950-06-13, 65 y.o. Date of Encounter: 04/16/2015,   Chief Complaint: Physical (CPE)  HPI: 65 y.o. y/o white female  here for CPE.   She reports that she has been having significant heartburn and would like medication for this. No other complaints or concerns today.   Review of Systems: Consitutional: No fever, chills, fatigue, night sweats, lymphadenopathy. No significant/unexplained weight changes. Eyes: No visual changes, eye redness, or discharge. ENT/Mouth: No ear pain, sore throat, nasal drainage, or sinus pain. Cardiovascular: No chest pressure,heaviness, tightness or squeezing, even with exertion.No palpitations, edema, orthopnea, PND. Respiratory: No hemoptysis. Gastrointestinal: No anorexia, dysphagia,  pain, nausea, vomiting, hematemesis, diarrhea, constipation, BRBPR, or melena. Breast: No mass, nodules, bulging, or retraction. No skin changes or inflammation. No nipple discharge. No lymphadenopathy. Genitourinary: No dysuria, hematuria, incontinence, vaginal discharge, pruritis, burning, abnormal bleeding, or pain. Musculoskeletal: No decreased ROM, No joint pain or swelling. No significant pain in neck, back, or extremities. Skin: No rash, pruritis, or concerning lesions. Neurological: No headache, dizziness, syncope, seizures, tremors, memory loss, coordination problems, or paresthesias. Psychological: No anxiety, depression, hallucinations, SI/HI. Endocrine: No polydipsia, polyphagia, polyuria, or known diabetes.No increased fatigue. No palpitations/rapid heart rate. No significant/unexplained weight change. All other systems were reviewed and are otherwise negative.  Past Medical History  Diagnosis Date  . Asthma   . Cataract   . COPD (chronic obstructive pulmonary disease) (HCC)   . Hypertension   . GERD (gastroesophageal reflux disease)      Past Surgical History  Procedure Laterality Date  . Partial  hymenectomy    . Cesarean section  1971/1976  . Hernia repair  2013    Home Meds:  Outpatient Prescriptions Prior to Visit  Medication Sig Dispense Refill  . albuterol (PROVENTIL HFA;[name] [name] 1 Inhaler 2  . amLODipine (NORVASC) 5 MG tablet Take 1 tablet (5 mg total) by mouth daily. 30 tablet 5  . buPROPion (WELLBUTRIN SR) 150 MG 12 hr tablet Take one daily for 5 days then take 1 twice a day 60 tablet 4  . cyclobenzaprine (FLEXERIL) 10 MG tablet Take 1 tablet (10 mg total) by mouth 3 (three) times daily as needed for muscle spasms. 30 tablet 1  . diphenhydrAMINE (BENADRYL) 25 MG tablet Take 25 mg by mouth every 6 (six) hours as needed for sleep.    Marland Kitchen Fluticasone-Salmeterol (ADVAIR DISKUS) 250-50 MCG/DOSE AEPB Inhale 1 puff into the lungs 2 (two) times daily. 1 each 3  . hydrochlorothiazide (HYDRODIURIL) 25 MG tablet [name] Reported on 03/03/2015 30 tablet 5  . lisinopril (PRINIVIL,[name] Take 1 tablet (20 mg total) by mouth at bedtime. Reported on 03/03/2015 30 tablet 5   No facility-administered medications prior to visit.    Allergies:  Allergies  Allergen Reactions  . Prednisone Other (See Comments)    arrhythmia     Social History   Social History  . Marital Status: Married    Spouse Name: N/A  . Number of Children: N/A  . Years of Education: N/A   Occupational History  . Not on file.   Social History Main Topics  . Smoking status: Current Every Day Smoker -- 0.50 packs/day for 30 years    Types: Cigarettes  . Smokeless tobacco: Never Used  . Alcohol Use:  No  . Drug Use: No  . Sexual Activity: Not Currently    Birth Control/ Protection: None   Other Topics Concern  . Not on file   Social History Narrative    Family History  Problem Relation Age of Onset  . Diabetes Other   .  Diabetes Mother   . Hypertension Mother   . Misc[place]riages [place] Mother   . Heart disease Father   . Diabetes Father   . Hypertension Father   . Cancer Sister     skin    Physical Exam: Blood pressure 102/66, pulse 60, temperature 97.6 F (36.4 C), temperature source Oral, resp. rate 18, height 5' (1.524 m), weight 206 lb (93.441 kg)., Body mass index is 40.23 kg/(m^2). General: Obese WF. Appears in no acute distress. HEENT: Normocephalic, atraumatic. Conjunctiva pink, sclera non-icteric. Pupils 2 mm constricting to 1 mm, round, regular, and equally reactive to light and accomodation. EOMI. Internal auditory canal clear. TMs with good cone of light and without pathology. Nasal mucosa pink. Nares are without discharge. No sinus tenderness. Oral mucosa pink.  Pharynx without exudate.   Neck: Supple. Trachea midline. No thyromegaly. Full ROM. No lymphadenopathy.No Carotid Bruits. Lungs: She has faint wheezes throughout bilaterally but good air movement.  Cardiovascular: RRR with S1 S2. No murmurs, rubs, or gallops. Trace distal pulses.  No carotid or abdominal bruits. Breast: Symmetrical. No masses. Nipples without discharge. Abdomen: Soft, non-tender, non-distended with normoactive bowel sounds. No hepatosplenomegaly or masses. No rebound/guarding. No CVA tenderness. No hernias.  Genitourinary:  She defers Pelvic Exam, Pap Smear. Discussed risk of cancer etc but she still defers.  Musculoskeletal: Full range of motion and 5/5 strength throughout.  Skin: Warm and moist without erythema, ecchymosis, wounds, or rash. Neuro: A+Ox3. CN II-XII grossly intact. Moves all extremities spontaneously. Full sensation throughout. Normal gait. DTR 2+ throughout upper and lower extremities.  Psych:  Responds to questions appropriately with a normal affect.   Assessment/Plan:  65 y.o. y/o female here for CPE  1. Visit for preventive health examination A. Screening Labs: She is fasting.  - CBC  with Differential/Platelet - COMPLETE METABOLIC PANEL WITH GFR - Lipid panel - TSH - VITAMIN D 25 Hydroxy (Vit-D Deficiency, Fractures)  B. Pap: She defers pelvic exam or Pap smear today. Discussed risk versus benefit but she still defers.  C. Screening Mammogram: She states that her last mammogram was about 3 years ago. She is agreeable to go for follow-up screening mammogram - MM Digital Screening; Future   D. DEXA/BMD:  Given her long smoking history, will go ahead and get DEXA.   E. Colorectal Cancer Screening: She has never had colonoscopy. I discussed colonoscopy with her but she defers. Discussed risk versus benefits but she still defers. I then discussed that we could do a stool test to help screen and she is agreeable so will do the cologuard.  F. Immunizations:  Influenza:   N/A Tetanus:  Will update today. She is agreeable. Pneumococcal:  He reports that about 3 years ago she was in the hospital first hernia surgery but also was having respiratory problems and at that time they gave her a flu vaccine and pneumonia vaccine. Therefore that should've been the Pneumovax 23.  When she turns age 65 then we'll give the Prevnar 1913. Zostavax:  I wrote this on her AVS. She is to call her insurance and find out with her specific insurance plan how much they cover him what this price would be and  then call us with this information.    2. Pulmonary emphysema, unspecified emphysema type (HCC) She is on Advair. Will add Spiriva. Continue the albuterol when necessary. - tiotropium (SPIRIVA HANDIHALER) 18 MCG inhalation capsule; Place 1 capsule (18 mcg total) into inhaler and inhale daily.  Dispense: 30 capsule; Refill: 12  3. Essential hypertension Blood Pressure at goal. Check lab to monitor.  4. Smoker She states that she has decreased smoking with the use of Wellbutrin. Says that now she will take 2 or 3 puffs off of the cigarettes and doesn't want any more of it. As well, she  isn't going through a full pack per day.  5. Gastroesophageal reflux disease, esophagitis presence not specified Will add omeprazole for her acid reflux symptoms. - omeprazole (PRILOSEC) 20 MG capsule; Take 1 capsule (20 mg total) by mouth daily.  Dispense: 30 capsule; Refill: 3   Signed, [address] [place], [place], [facility] 04/16/2015 9:01 AM

## 2015-04-17 ENCOUNTER — Telehealth: Payer: Self-pay | Admitting: Family Medicine

## 2015-04-17 DIAGNOSIS — E559 Vitamin D deficiency, unspecified: Secondary | ICD-10-CM | POA: Insufficient documentation

## 2015-04-17 DIAGNOSIS — K219 Gastro-esophageal reflux disease without esophagitis: Secondary | ICD-10-CM

## 2015-04-17 DIAGNOSIS — J439 Emphysema, unspecified: Secondary | ICD-10-CM

## 2015-04-17 LAB — VITAMIN D 25 HYDROXY (VIT D DEFICIENCY, FRACTURES): VIT D 25 HYDROXY: 6 ng/mL — AB (ref 30–100)

## 2015-04-17 MED ORDER — VITAMIN D (ERGOCALCIFEROL) 1.25 MG (50000 UNIT) PO CAPS: 50000.0000 [IU] | ORAL_CAPSULE | ORAL | Status: DC

## 2015-04-17 MED ORDER — OMEPRAZOLE 20 MG PO CPDR: 20.0000 mg | DELAYED_RELEASE_CAPSULE | Freq: Every day | ORAL | Status: DC

## 2015-04-17 MED ORDER — TIOTROPIUM BROMIDE MONOHYDRATE 18 MCG IN CAPS: 18.0000 ug | ORAL_CAPSULE | Freq: Every day | RESPIRATORY_TRACT | Status: DC

## 2015-04-17 NOTE — Telephone Encounter (Signed)
-----   Message from Dorena BodoMary B Dixon, PA-C sent at 04/17/2015  8:18 AM EDT ----- Vitamin D very low.  Take Ergocalciferol 50,000 units once weekly for 12 weeks . When complete this, take otc Vitamin D 2,000 units daily. Add Vitamin D deficiency to Problem List.  Tell her that all other labs look good.

## 2015-04-17 NOTE — Telephone Encounter (Signed)
Pt aware of lab results and provider recommendations.  New Rx for Vit D to pharmacy.  Pt claims pharmacy does not have meds refilled at OV.  Those resent as well.

## 2015-04-17 NOTE — Addendum Note (Signed)
Addended by: Allayne ButcherIXON, MARY on: 04/17/2015 12:34 PM   Modules accepted: Orders

## 2015-04-24 ENCOUNTER — Other Ambulatory Visit: Payer: Self-pay | Admitting: Physician Assistant

## 2015-04-24 DIAGNOSIS — Z1231 Encounter for screening mammogram for malignant neoplasm of breast: Secondary | ICD-10-CM

## 2015-05-19 ENCOUNTER — Other Ambulatory Visit

## 2015-05-19 ENCOUNTER — Ambulatory Visit

## 2015-05-29 ENCOUNTER — Emergency Department (HOSPITAL_COMMUNITY)

## 2015-05-29 ENCOUNTER — Encounter: Payer: Self-pay | Admitting: Physician Assistant

## 2015-05-29 ENCOUNTER — Inpatient Hospital Stay (HOSPITAL_COMMUNITY)

## 2015-05-29 ENCOUNTER — Ambulatory Visit (INDEPENDENT_AMBULATORY_CARE_PROVIDER_SITE_OTHER): Admitting: Physician Assistant

## 2015-05-29 ENCOUNTER — Inpatient Hospital Stay (HOSPITAL_COMMUNITY)
Admission: EM | Admit: 2015-05-29 | Discharge: 2015-06-03 | DRG: 190 | Disposition: A | Discharge status: Home / Self Care | Attending: Internal Medicine | Admitting: Internal Medicine

## 2015-05-29 ENCOUNTER — Encounter (HOSPITAL_COMMUNITY): Payer: Self-pay | Admitting: Emergency Medicine

## 2015-05-29 VITALS — BP 84/56 | HR 72 | Temp 97.9°F | Resp 22 | Wt 200.0 lb

## 2015-05-29 DIAGNOSIS — Z6839 Body mass index (BMI) 39.0-39.9, adult: Secondary | ICD-10-CM

## 2015-05-29 DIAGNOSIS — J441 Chronic obstructive pulmonary disease with (acute) exacerbation: Secondary | ICD-10-CM | POA: Diagnosis present

## 2015-05-29 DIAGNOSIS — Z809 Family history of malignant neoplasm, unspecified: Secondary | ICD-10-CM | POA: Diagnosis not present

## 2015-05-29 DIAGNOSIS — E876 Hypokalemia: Secondary | ICD-10-CM | POA: Diagnosis present

## 2015-05-29 DIAGNOSIS — I10 Essential (primary) hypertension: Secondary | ICD-10-CM | POA: Diagnosis present

## 2015-05-29 DIAGNOSIS — F17210 Nicotine dependence, cigarettes, uncomplicated: Secondary | ICD-10-CM | POA: Diagnosis present

## 2015-05-29 DIAGNOSIS — N179 Acute kidney failure, unspecified: Secondary | ICD-10-CM | POA: Diagnosis present

## 2015-05-29 DIAGNOSIS — Z833 Family history of diabetes mellitus: Secondary | ICD-10-CM | POA: Diagnosis not present

## 2015-05-29 DIAGNOSIS — K210 Gastro-esophageal reflux disease with esophagitis: Secondary | ICD-10-CM | POA: Diagnosis present

## 2015-05-29 DIAGNOSIS — N289 Disorder of kidney and ureter, unspecified: Secondary | ICD-10-CM

## 2015-05-29 DIAGNOSIS — T380X5A Adverse effect of glucocorticoids and synthetic analogues, initial encounter: Secondary | ICD-10-CM | POA: Diagnosis present

## 2015-05-29 DIAGNOSIS — J9601 Acute respiratory failure with hypoxia: Secondary | ICD-10-CM | POA: Diagnosis present

## 2015-05-29 DIAGNOSIS — I959 Hypotension, unspecified: Secondary | ICD-10-CM | POA: Diagnosis present

## 2015-05-29 DIAGNOSIS — E869 Volume depletion, unspecified: Secondary | ICD-10-CM | POA: Diagnosis present

## 2015-05-29 DIAGNOSIS — R7989 Other specified abnormal findings of blood chemistry: Secondary | ICD-10-CM | POA: Diagnosis present

## 2015-05-29 DIAGNOSIS — J189 Pneumonia, unspecified organism: Secondary | ICD-10-CM

## 2015-05-29 DIAGNOSIS — Z7951 Long term (current) use of inhaled steroids: Secondary | ICD-10-CM

## 2015-05-29 DIAGNOSIS — R739 Hyperglycemia, unspecified: Secondary | ICD-10-CM | POA: Diagnosis present

## 2015-05-29 DIAGNOSIS — R0600 Dyspnea, unspecified: Secondary | ICD-10-CM

## 2015-05-29 DIAGNOSIS — J440 Chronic obstructive pulmonary disease with acute lower respiratory infection: Principal | ICD-10-CM | POA: Diagnosis present

## 2015-05-29 DIAGNOSIS — M79604 Pain in right leg: Secondary | ICD-10-CM | POA: Diagnosis present

## 2015-05-29 DIAGNOSIS — J439 Emphysema, unspecified: Secondary | ICD-10-CM

## 2015-05-29 DIAGNOSIS — E669 Obesity, unspecified: Secondary | ICD-10-CM | POA: Diagnosis present

## 2015-05-29 DIAGNOSIS — R0603 Acute respiratory distress: Secondary | ICD-10-CM

## 2015-05-29 DIAGNOSIS — Z8249 Family history of ischemic heart disease and other diseases of the circulatory system: Secondary | ICD-10-CM

## 2015-05-29 DIAGNOSIS — R0602 Shortness of breath: Secondary | ICD-10-CM | POA: Diagnosis present

## 2015-05-29 DIAGNOSIS — Z720 Tobacco use: Secondary | ICD-10-CM | POA: Diagnosis present

## 2015-05-29 DIAGNOSIS — E871 Hypo-osmolality and hyponatremia: Secondary | ICD-10-CM | POA: Diagnosis present

## 2015-05-29 HISTORY — DX: Nicotine dependence, unspecified, uncomplicated: F17.200

## 2015-05-29 LAB — COMPREHENSIVE METABOLIC PANEL
ALBUMIN: 3.4 g/dL — AB (ref 3.5–5.0)
ALT: 11 U/L — ABNORMAL LOW (ref 14–54)
AST: 18 U/L (ref 15–41)
Alkaline Phosphatase: 83 U/L (ref 38–126)
Anion gap: 12 (ref 5–15)
BILIRUBIN TOTAL: 0.6 mg/dL (ref 0.3–1.2)
BUN: 44 mg/dL — AB (ref 6–20)
CHLORIDE: 98 mmol/L — AB (ref 101–111)
CO2: 22 mmol/L (ref 22–32)
Calcium: 8.7 mg/dL — ABNORMAL LOW (ref 8.9–10.3)
Creatinine, Ser: 2.03 mg/dL — ABNORMAL HIGH (ref 0.44–1.00)
GFR calc Af Amer: 29 mL/min — ABNORMAL LOW (ref >60)
GFR calc non Af Amer: 25 mL/min — ABNORMAL LOW (ref >60)
GLUCOSE: 121 mg/dL — AB (ref 65–99)
POTASSIUM: 4.2 mmol/L (ref 3.5–5.1)
Sodium: 132 mmol/L — ABNORMAL LOW (ref 135–145)
Total Protein: 7.9 g/dL (ref 6.5–8.1)

## 2015-05-29 LAB — CBC
HEMATOCRIT: 36.3 % (ref 36.0–46.0)
Hemoglobin: 12.3 g/dL (ref 12.0–15.0)
MCH: 26.1 pg (ref 26.0–34.0)
MCHC: 33.9 g/dL (ref 30.0–36.0)
MCV: 76.9 fL — AB (ref 78.0–100.0)
Platelets: 251 10*3/uL (ref 150–400)
RBC: 4.72 MIL/uL (ref 3.87–5.11)
RDW: 19.1 % — AB (ref 11.5–15.5)
WBC: 11.1 10*3/uL — AB (ref 4.0–10.5)

## 2015-05-29 LAB — BRAIN NATRIURETIC PEPTIDE: B Natriuretic Peptide: 34 pg/mL (ref 0.0–100.0)

## 2015-05-29 LAB — TROPONIN I: Troponin I: <0.03 ng/mL (ref <0.031)

## 2015-05-29 MED ORDER — IPRATROPIUM-ALBUTEROL 0.5-2.5 (3) MG/3ML IN SOLN
3.0000 mL | RESPIRATORY_TRACT | Status: DC
  Administered 2015-05-29 – 2015-06-03 (×25): 3 mL via RESPIRATORY_TRACT
  Filled 2015-05-29 (×26): qty 3

## 2015-05-29 MED ORDER — ENOXAPARIN SODIUM 30 MG/0.3ML ~~LOC~~ SOLN
30.0000 mg | SUBCUTANEOUS | Status: DC
  Administered 2015-05-29: 30 mg via SUBCUTANEOUS
  Filled 2015-05-29: qty 0.3

## 2015-05-29 MED ORDER — MOMETASONE FURO-FORMOTEROL FUM 200-5 MCG/ACT IN AERO
2.0000 | INHALATION_SPRAY | Freq: Two times a day (BID) | RESPIRATORY_TRACT | Status: DC
  Administered 2015-05-29 – 2015-06-03 (×10): 2 via RESPIRATORY_TRACT
  Filled 2015-05-29: qty 8.8

## 2015-05-29 MED ORDER — OXYCODONE HCL 5 MG PO TABS: 5.0000 mg | ORAL_TABLET | ORAL | Status: DC | PRN

## 2015-05-29 MED ORDER — ACETAMINOPHEN 650 MG RE SUPP: 650.0000 mg | Freq: Four times a day (QID) | RECTAL | Status: DC | PRN

## 2015-05-29 MED ORDER — IPRATROPIUM-ALBUTEROL 0.5-2.5 (3) MG/3ML IN SOLN
3.0000 mL | Freq: Once | RESPIRATORY_TRACT | Status: AC
  Administered 2015-05-29: 3 mL via RESPIRATORY_TRACT

## 2015-05-29 MED ORDER — DIPHENHYDRAMINE HCL 25 MG PO CAPS
25.0000 mg | ORAL_CAPSULE | Freq: Every evening | ORAL | Status: DC | PRN
  Administered 2015-06-02: 25 mg via ORAL
  Filled 2015-05-29: qty 1

## 2015-05-29 MED ORDER — DEXTROSE 5 % IV SOLN
1.0000 g | INTRAVENOUS | Status: DC
  Administered 2015-05-30 – 2015-06-03 (×5): 1 g via INTRAVENOUS
  Filled 2015-05-29 (×6): qty 10

## 2015-05-29 MED ORDER — MAGNESIUM SULFATE 50 % IJ SOLN: 2.0000 g | Freq: Once | INTRAVENOUS | Status: DC

## 2015-05-29 MED ORDER — IPRATROPIUM-ALBUTEROL 0.5-2.5 (3) MG/3ML IN SOLN
3.0000 mL | Freq: Once | RESPIRATORY_TRACT | Status: AC
  Administered 2015-05-29: 3 mL via RESPIRATORY_TRACT
  Filled 2015-05-29: qty 3

## 2015-05-29 MED ORDER — ONDANSETRON HCL 4 MG PO TABS: 4.0000 mg | ORAL_TABLET | Freq: Four times a day (QID) | ORAL | Status: DC | PRN

## 2015-05-29 MED ORDER — SODIUM CHLORIDE 0.9 % IV SOLN
INTRAVENOUS | Status: DC
  Administered 2015-05-29 – 2015-06-01 (×3): via INTRAVENOUS

## 2015-05-29 MED ORDER — DEXAMETHASONE SODIUM PHOSPHATE 4 MG/ML IJ SOLN
8.0000 mg | Freq: Four times a day (QID) | INTRAMUSCULAR | Status: DC
  Administered 2015-05-29 – 2015-05-30 (×4): 8 mg via INTRAVENOUS
  Filled 2015-05-29 (×4): qty 2

## 2015-05-29 MED ORDER — ONDANSETRON HCL 4 MG/2ML IJ SOLN: 4.0000 mg | Freq: Four times a day (QID) | INTRAMUSCULAR | Status: DC | PRN

## 2015-05-29 MED ORDER — DEXTROSE 5 % IV SOLN
500.0000 mg | INTRAVENOUS | Status: DC
  Administered 2015-05-29 – 2015-06-02 (×5): 500 mg via INTRAVENOUS
  Filled 2015-05-29 (×6): qty 500

## 2015-05-29 MED ORDER — DEXTROSE 5 % IV SOLN
1.0000 g | Freq: Once | INTRAVENOUS | Status: AC
  Administered 2015-05-29: 1 g via INTRAVENOUS
  Filled 2015-05-29: qty 10

## 2015-05-29 MED ORDER — SODIUM CHLORIDE 0.9 % IV BOLUS (SEPSIS)
1000.0000 mL | Freq: Once | INTRAVENOUS | Status: AC
  Administered 2015-05-29: 1000 mL via INTRAVENOUS

## 2015-05-29 MED ORDER — ALBUTEROL SULFATE (2.5 MG/3ML) 0.083% IN NEBU: 2.5000 mg | INHALATION_SOLUTION | RESPIRATORY_TRACT | Status: DC | PRN

## 2015-05-29 MED ORDER — CYCLOBENZAPRINE HCL 10 MG PO TABS: 5.0000 mg | ORAL_TABLET | Freq: Three times a day (TID) | ORAL | Status: DC | PRN

## 2015-05-29 MED ORDER — ACETAMINOPHEN 325 MG PO TABS
650.0000 mg | ORAL_TABLET | Freq: Four times a day (QID) | ORAL | Status: DC | PRN
  Administered 2015-05-30 – 2015-06-02 (×2): 650 mg via ORAL
  Filled 2015-05-29 (×2): qty 2

## 2015-05-29 MED ORDER — PANTOPRAZOLE SODIUM 40 MG PO TBEC
40.0000 mg | DELAYED_RELEASE_TABLET | Freq: Every day | ORAL | Status: DC
  Administered 2015-05-30 – 2015-06-03 (×5): 40 mg via ORAL
  Filled 2015-05-29 (×5): qty 1

## 2015-05-29 MED ORDER — MAGNESIUM SULFATE 2 GM/50ML IV SOLN
2.0000 g | Freq: Once | INTRAVENOUS | Status: AC
  Administered 2015-05-29: 2 g via INTRAVENOUS
  Filled 2015-05-29: qty 50

## 2015-05-29 NOTE — ED Notes (Signed)
Pt daughter Lauren Koch [phone]

## 2015-05-29 NOTE — Progress Notes (Signed)
Patient ID: Lauren Koch MRN: [id number], DOB: 07-Apr-1950, 65 y.o. Date of Encounter: 05/29/2015, 10:42 AM    Chief Complaint:  Chief Complaint  Patient presents with  . sick x 1 week    short of breath, back/leg pain, cough, weak when standing, not eating     HPI: 65 y.o. year old white female presents with above.   She has a female friend here with her today who drove her to appt.  Pt reports she has been having increased SOB.  Oxygen Saturation on RA 86%.  Immediately placed on oxygen nasal cannula Exam reveals bilateral diffuse -- harsh wheezing--very tight with very little air movement. Treated with Duo Neb. Planned to give DepoMedrol until noted she is intolerant to Prednisone.   After DuoNeb, oxygen saturation only up to 89%.   Discussed need to go to hospital.  Her friend agrees to drive her directly to [facility] ER. Pt left office in stable condition.     Home Meds:   Outpatient Prescriptions Prior to Visit  Medication Sig Dispense Refill  . albuterol (PROVENTIL HFA;[name] [name] 1 Inhaler 2  . amLODipine (NORVASC) 5 MG tablet Take 1 tablet (5 mg total) by mouth daily. 30 tablet 5  . buPROPion (WELLBUTRIN SR) 150 MG 12 hr tablet Take one daily for 5 days then take 1 twice a day 60 tablet 4  . cyclobenzaprine (FLEXERIL) 10 MG tablet Take 1 tablet (10 mg total) by mouth 3 (three) times daily as needed for muscle spasms. 30 tablet 1  . diphenhydrAMINE (BENADRYL) 25 MG tablet Take 25 mg by mouth every 6 (six) hours as needed for sleep.    Marland Kitchen Fluticasone-Salmeterol (ADVAIR DISKUS) 250-50 MCG/DOSE AEPB Inhale 1 puff into the lungs 2 (two) times daily. 1 each 3  . hydrochlorothiazide (HYDRODIURIL) 25 MG tablet [name] Reported on 03/03/2015 30 tablet 5  . lisinopril (PRINIVIL,[name] Take 1  tablet (20 mg total) by mouth at bedtime. Reported on 03/03/2015 30 tablet 5  . omeprazole (PRILOSEC) 20 MG capsule Take 1 capsule (20 mg total) by mouth daily. 30 capsule 5  . tiotropium (SPIRIVA HANDIHALER) 18 MCG inhalation capsule Place 1 capsule (18 mcg total) into inhaler and inhale daily. 30 capsule 5  . Vitamin D, Ergocalciferol, (DRISDOL) 50000 units CAPS capsule Take 1 capsule (50,000 Units total) by mouth every 7 (seven) days. 12 capsule 0   No facility-administered medications prior to visit.    Allergies:  Allergies  Allergen Reactions  . Prednisone Other (See Comments)    arrhythmia       Review of Systems: See HPI for pertinent ROS. All other ROS negative.    Physical Exam: Blood pressure 84/56, pulse 72, temperature 97.9 F (36.6 C), temperature source Oral, resp. rate 22, weight 200 lb (90.719 kg), SpO2 86 %., Body mass index is 39.06 kg/(m^2). General:  Obese WF. Appears in no acute distress. Neck: Supple. No thyromegaly. No lymphadenopathy. Lungs: Very tight with diffuse harsh wheezing throughout bilaterally. Heart: Regular rhythm. No murmurs, rubs, or gallops. Msk:  Strength and tone normal for age. Extremities/Skin: Warm and dry. Neuro: Alert and oriented X 3. Moves all extremities spontaneously. Gait is normal. CNII-XII grossly in tact. Psych:  Responds to questions appropriately with a normal affect.     ASSESSMENT  AND PLAN:  65 y.o. year old female with   y.o. year old female with  .o. year old female with  1. Respiratory distress - ipratropium-albuterol (DUONEB) 0.5-2.5 (3) MG/3ML nebulizer solution 3 mL; Take 3 mLs by nebulization once.  2. COPD exacerbation [facility](HCC) She has a female friend here with her today who drove her to appt.  Pt reports she has been having increased SOB.  Oxygen Saturation on RA 86%.  Immediately placed on oxygen nasal cannula Exam reveals bilateral diffuse -- harsh wheezing--very tight with very little air movement. Treated with Duo Neb. Planned to give DepoMedrol until noted  she is intolerant to Prednisone.   After DuoNeb, oxygen saturation only up to 89%.   Discussed need to go to hospital.  Her friend agrees to drive her directly [facility]. Pt left office in stable condition.    Murray HodgkinsSigned, Lalita Ebel Beth Rapids CityDixon, [place]PA, [facility] CenterBSFM 05/29/2015 10:42 AM

## 2015-05-29 NOTE — ED Notes (Signed)
Pt states she has been getting progressively more short of breath for the past week.  Has been having generalized weakness and a cough for this time as well.  Sent from her primary care doctor's office.

## 2015-05-29 NOTE — H&P (Signed)
History and Physical    Lauren Koch UEA:[id number] DOB: Jul 05, 1950 DOA: 05/29/2015  Referring MD/NP/PA: Dr. Adriana Simas PCP: Frazier Richards, PA-C  Outpatient Specialists:  Patient coming from: Home  Chief Complaint: Shortness of breath  HPI: Lauren Koch is a 65 y.o. female with medical history significant for COPD, tobacco abuse, hypertension, and GERD, who presents to the ED with a chief complaint of shortness of breath. Her symptoms started approximately 1 week ago and have become progressively worse. She now has progressive chest congestion wheezing and a nonproductive cough. She has shortness of breath at rest and with exertion. She has central chest tightness when wheezing and when coughing, but no radiation to her arm, jaw, and no associated diaphoresis. She has had nausea, but no subjective fever, chills, abdominal pain, pain with urination, diarrhea, bloody stools, or swelling in her legs. She usually smokes a pack of cigarettes per day, but has been unable to for approximately 4 days secondary to shortness of breath. Nothing she has done has improved her symptoms. She was seen by her PCP, PA, Ms. Dixon prior to admission. Per her note, the patient was given a DuoNeb in the office and Depo-Medrol was planned, but the patient reported an allergy to prednisone.  ED Course: In the ED, she was initially hypoxic with [place] oxygen saturation of 86% on room air. She was afebrile and hypotensive with a blood pressure in the 80s systolically. Her lab data were significant for WBC of 11.1, sodium of 132, BUN of 44, creatinine of 2.03, and glucose of 121. Troponin I was negative and her BNP was only 34. Her chest x-ray revealed nodular consolidation at the right lung base, patchy consolidation in the right middle lobe suspicious for pneumonia, and streaky densities at the left lung base. She is being admitted for further evaluation and management.  Review of Systems: As per HPI otherwise ; in addition,  positive for generalized fatigue, poor appetite, chronic low back pain with some radiation to the right leg; otherwise 10 point review of systems negative.    Past Medical History  Diagnosis Date  . Asthma   . Cataract   . COPD (chronic obstructive pulmonary disease) (HCC)   . Hypertension   . GERD (gastroesophageal reflux disease)   . Smoker     Past Surgical History  Procedure Laterality Date  . Partial hymenectomy    . Cesarean section  1971/1976  . Hernia repair  2013    Social history: She is married. She has 2 children. She still drives a little. She reports that she has been smoking Cigarettes and smokes approximately one pack per day..  She has a 15 pack-year smoking history. She has never used smokeless tobacco. She reports that she does not drink alcohol or use illicit drugs.  Allergies  Allergen Reactions  . Prednisone Other (See Comments)    arrhythmia     Family History  Problem Relation Age of Onset  . Diabetes Other   . Diabetes Mother   . Hypertension Mother   . Miscarriages / [place] Mother   . Heart disease Father   . Diabetes Father   . Hypertension Father   . Cancer Sister     skin     Prior to Admission medications   Medication Sig Start Date End Date Taking? Authorizing Provider  albuterol (PROVENTIL HFA;[name] [name]  03/03/15  Yes Mary B Dixon, PA-C  amLODipine (NORVASC) 5 MG tablet Take 1 tablet (5 mg total) by mouth daily. 04/02/15  Yes Dorena BodoMary B Dixon, PA-C  buPROPion (WELLBUTRIN SR) 150 MG 12 hr tablet Take one daily for 5 days then take 1 twice a day 04/02/15  Yes Dorena BodoMary B Dixon, PA-C  cyclobenzaprine (FLEXERIL) 10 MG tablet Take 1 tablet (10 mg total) by mouth 3 (three) times daily as needed for muscle spasms. 04/04/15  Yes Mary B Dixon, PA-C  diphenhydrAMINE (BENADRYL) 25 MG tablet Take 25 mg by mouth every 6 (six) hours  as needed for sleep.   Yes Historical Provider, MD  Fluticasone-Salmeterol (ADVAIR DISKUS) 250-50 MCG/DOSE AEPB Inhale 1 puff into the lungs 2 (two) times daily. 03/17/15  Yes Mary B Dixon, PA-C  hydrochlorothiazide (HYDRODIURIL) 25 MG tablet [name] Reported on 03/03/2015 04/02/15  Yes Mary B Dixon, PA-C  lisinopril (PRINIVIL,[name] Take 1 tablet (20 mg total) by mouth at bedtime. Reported on 03/03/2015 04/02/15  Yes Dorena BodoMary B Dixon, PA-C  omeprazole (PRILOSEC) 20 MG capsule Take 1 capsule (20 mg total) by mouth daily. 04/17/15  Yes Mary B Dixon, PA-C  tiotropium (SPIRIVA HANDIHALER) 18 MCG inhalation capsule Place 1 capsule (18 mcg total) into inhaler and inhale daily. 04/17/15  Yes Dorena BodoMary B Dixon, PA-C  Vitamin D, Ergocalciferol, (DRISDOL) 50000 units CAPS capsule Take 1 capsule (50,000 Units total) by mouth every 7 (seven) days. 04/17/15  Yes Dorena BodoMary B Dixon, PA-C    Physical Exam: Filed Vitals:   05/29/15 1206 05/29/15 1230 05/29/15 1331 05/29/15 1400  BP:  97/58 104/64 116/95  Pulse:  76 77 85  Temp:      TempSrc:      Resp:  25 21 15   Height:      Weight:      SpO2: 94% 92% 97% 93%      Constitutional: NAD, calm, comfortable Filed Vitals:   05/29/15 1206 05/29/15 1230 05/29/15 1331 05/29/15 1400  BP:  97/58 104/64 116/95  Pulse:  76 77 85  Temp:      TempSrc:      Resp:  25 21 15   Height:      Weight:      SpO2: 94% 92% 97% 93%   General: 65 year old Caucasian woman who appears ill, but in no acute distress. She appears ill. Eyes: PERRL, lids and conjunctivae normal ENMT: Mucous membranes are dry.. Posterior pharynx clear of any exudate or lesions. Multiple missing teeth..  Neck: normal, supple, no masses, no thyromegaly Respiratory: Diffuse rhonchorous wheezing.  Cardiovascular: Regular rate and rhythm, no murmurs / rubs / gallops. No extremity edema. 2+ pedal pulses. No carotid bruits.  Abdomen: no tenderness, no masses palpated. No  hepatosplenomegaly. Bowel sounds positive.  Musculoskeletal: no clubbing / cyanosis. No joint deformity upper and lower extremities. Good ROM, no contractures. Normal muscle tone.  Skin: no rashes, lesions, ulcers. No induration Neurologic: CN 2-12 grossly intact. Sensation intact, DTR normal. Strength 5/5 in all 4.  Psychiatric: Normal judgment and insight. Alert and oriented x 3. Flat affect   Labs on Admission: I have personally reviewed following labs and imaging studies  CBC:  Recent Labs Lab 05/29/15 1115  WBC 11.1*  HGB 12.3  HCT 36.3  MCV 76.9*  PLT 251   Basic Metabolic Panel:  Recent Labs Lab 05/29/15 1115  NA 132*  K 4.2  CL 98*  CO2 22  GLUCOSE  121*  BUN 44*  CREATININE 2.03*  CALCIUM 8.7*   GFR: Estimated Creatinine Clearance: 28.1 mL/min (by C-G formula based on Cr of 2.03). Liver Function Tests:  Recent Labs Lab 05/29/15 1115  AST 18  ALT 11*  ALKPHOS 83  BILITOT 0.6  PROT 7.9  ALBUMIN 3.4*   No results for input(s): LIPASE, AMYLASE in the last 168 hours. No results for input(s): AMMONIA in the last 168 hours. Coagulation Profile: No results for input(s): INR, PROTIME in the last 168 hours. Cardiac Enzymes:  Recent Labs Lab 05/29/15 1115  TROPONINI <0.03   BNP (last 3 results) No results for input(s): PROBNP in the last 8760 hours. HbA1C: No results for input(s): HGBA1C in the last 72 hours. CBG: No results for input(s): GLUCAP in the last 168 hours. Lipid Profile: No results for input(s): CHOL, HDL, LDLCALC, TRIG, CHOLHDL, LDLDIRECT in the last 72 hours. Thyroid Function Tests: No results for input(s): TSH, T4TOTAL, FREET4, T3FREE, THYROIDAB in the last 72 hours. Anemia Panel: No results for input(s): VITAMINB12, FOLATE, FERRITIN, TIBC, IRON, RETICCTPCT in the last 72 hours. Urine analysis: No results found for: COLORURINE, APPEARANCEUR, LABSPEC, PHURINE, GLUCOSEU, HGBUR, BILIRUBINUR, KETONESUR, PROTEINUR, UROBILINOGEN, NITRITE,  LEUKOCYTESUR Sepsis Labs: @LABRCNTIP (procalcitonin:4,lacticidven:4) )No results found for this or any previous visit (from the past 240 hour(s)).   Radiological Exams on Admission: Dg Chest 2 View  05/29/2015  CLINICAL DATA:  Shortness of breath and cough. EXAM: CHEST  2 VIEW COMPARISON:  06/08/2014. FINDINGS: Trachea is midline. Heart size stable. Right-sided aortic arch. Probable minimal bibasilar scarring. There is nodular consolidation at the right lung base. Patchy consolidation in the right middle lobe. Streaky densities at the left lung base. Lungs are otherwise clear. No pleural fluid. IMPRESSION: 1. Nodular consolidation at the right lung base. While this may be infectious or inflammatory in etiology, a true pulmonary nodule cannot be excluded. Consider CT chest without contrast in 3-4 weeks to ensure resolution and exclude malignancy. 2. Patchy consolidation in the right middle lobe, suspicious for pneumonia. This can also be reassessed on follow-up CT imaging recommended above. 3. Streaky densities at the left lung base may be due to atelectasis/scarring. Electronically Signed   By: Leanna Battles M.D.   On: 05/29/2015 12:16    EKG: Independently reviewed. Normal sinus rhythm with nonspecific T-wave changes.  Assessment/Plan Principal Problem:   CAP (community acquired pneumonia) Active Problems:   Acute respiratory failure with hypoxia (HCC)   COPD exacerbation (HCC)   Acute kidney injury (HCC)   Essential hypertension   Obesity   Tobacco abuse   Right leg pain    1. Acute respiratory with hypoxia secondary to community acquired pneumonia and COPD with exacerbation. In the ED, patient's chest x-ray noted for nodular pneumonia at the right lung base, patchy consolidation in the right middle lobe, and streaky densities at the left lung base. She was hypoxic with an oxygen saturation of 86% on room air. She was afebrile, but her white blood cell count was marginally elevated at  11.1. -She was started on Rocephin and azithromycin. This will be continued. She was given duo nebulizer; it will be continued every 4 hours with breakthrough when necessary albuterol nebulizer as needed. -The patient needs the anti-inflammatory effects of a steroid, but she reports intolerance or allergy to prednisone which had previously caused rash, palpitations, and possibly swelling of her throat. She says that she was treated with Decadron before without side effects. -We will start Decadron although it may not  have as much of the anti-inflammatory effect. I have asked pharmacy to assist with evaluation and recommendation for a more comparable steroid hopefully without causing significant side effects. -We will continue oxygen started in the ED. Titrate accordingly. -For further evaluation, will order noncontrasted CT given the "nodulular "consolidation at the lung base; given her smoking history.  Tobacco abuse. The patient was strongly advised to stop smoking indefinitely. To her credit, she had not smoked in for 5 days. Will order tobacco cessation counseling.  Hypertension with relative hypotension on admission. Patient is treated chronically with amlodipine, lisinopril, and HCTZ. She appears to be volume depleted. These medications will be withheld. She will be started on vigorous IV fluids.  Acute kidney injury. Patient's creatinine on admission is 2.03 and her BUN is 44. Her creatinine from 06/2014 was within normal limits. She appears to be volume depleted and the AKI is likely from dehydration with possible contribution from Lisinopril and HCTZ. They will be withheld. She will be continued on IV fluid hydration. Her renal function will be monitored.  Right leg pain. No evidence on exam today. She may have mild chronic radiculopathy. Will treat symptomatically and supportively.   DVT prophylaxis: LOVENOX Code Status: Full code Family Communication: Discussed with niece,  Selena Batten Disposition Plan: Discharged home in clinically appropriate, likely in a few days. Consults called: None Admission status: Inpatient admission   [facility] MD [facility] Pager [phone]  If 7PM-7AM, please contact night-coverage [email or web address] Password [facility]  05/29/2015, 3:01 PM

## 2015-05-29 NOTE — ED Provider Notes (Addendum)
CSN: [id number]     Arrival date & time 05/29/15  1101 History   First MD Initiated Contact with Patient 05/29/15 1201     Chief Complaint  Patient presents with  . Shortness of Breath     (Consider location/radiation/quality/duration/timing/severity/associated sxs/prior Treatment) HPI..... Worsening cough, dyspnea over the past several days. She is dyspneic with exertion. She was seen by her primary care doctor today and sent to the emergency department. She has known COPD and is a smoker. Severity of symptoms is moderate to severe.  Past Medical History  Diagnosis Date  . Asthma   . Cataract   . COPD (chronic obstructive pulmonary disease) (HCC)   . Hypertension   . GERD (gastroesophageal reflux disease)    Past Surgical History  Procedure Laterality Date  . Partial hymenectomy    . Cesarean section  1971/1976  . Hernia repair  2013   Family History  Problem Relation Age of Onset  . Diabetes Other   . Diabetes Mother   . Hypertension Mother   . Miscarriages / [place] Mother   . Heart disease Father   . Diabetes Father   . Hypertension Father   . Cancer Sister     skin   Social History  Substance Use Topics  . Smoking status: Current Every Day Smoker -- 0.50 packs/day for 30 years    Types: Cigarettes  . Smokeless tobacco: Never Used  . Alcohol Use: No   OB History    Gravida Para Term Preterm AB TAB SAB Ectopic Multiple Living   2 2 2       2      Review of Systems  All other systems reviewed and are negative.     Allergies  Prednisone  Home Medications   Prior to Admission medications   Medication Sig Start Date End Date Taking? Authorizing Provider  albuterol (PROVENTIL HFA;[name] [name] 03/03/15  Yes Mary B Dixon, PA-C  amLODipine (NORVASC) 5 MG tablet Take 1 tablet (5 mg total) by mouth daily. 04/02/15  Yes Dorena BodoMary B Dixon,  PA-C  buPROPion (WELLBUTRIN SR) 150 MG 12 hr tablet Take one daily for 5 days then take 1 twice a day 04/02/15  Yes Dorena BodoMary B Dixon, PA-C  cyclobenzaprine (FLEXERIL) 10 MG tablet Take 1 tablet (10 mg total) by mouth 3 (three) times daily as needed for muscle spasms. 04/04/15  Yes Mary B Dixon, PA-C  diphenhydrAMINE (BENADRYL) 25 MG tablet Take 25 mg by mouth every 6 (six) hours as needed for sleep.   Yes Historical Provider, MD  Fluticasone-Salmeterol (ADVAIR DISKUS) 250-50 MCG/DOSE AEPB Inhale 1 puff into the lungs 2 (two) times daily. 03/17/15  Yes Mary B Dixon, PA-C  hydrochlorothiazide (HYDRODIURIL) 25 MG tablet [name] Reported on 03/03/2015 04/02/15  Yes Mary B Dixon, PA-C  lisinopril (PRINIVIL,[name] Take 1 tablet (20 mg total) by mouth at bedtime. Reported on 03/03/2015 04/02/15  Yes Dorena BodoMary B Dixon, PA-C  omeprazole (PRILOSEC) 20 MG capsule Take 1 capsule (20 mg total) by mouth daily. 04/17/15  Yes Mary B Dixon, PA-C  tiotropium (SPIRIVA HANDIHALER) 18 MCG inhalation capsule Place 1 capsule (18 mcg total) into inhaler and inhale daily. 04/17/15  Yes Dorena BodoMary B Dixon, PA-C  Vitamin D, Ergocalciferol, (DRISDOL) 50000 units CAPS capsule Take 1 capsule (50,000 Units total) by mouth  every 7 (seven) days. 04/17/15  Yes Mary B Dixon, PA-C   BP 97/56 mmHg  Pulse 72  Temp(Src) 97.8 F (36.6 C) (Oral)  Resp 22  Ht 5' (1.524 m)  Wt 200 lb (90.719 kg)  BMI 39.06 kg/m2  SpO2 94% Physical Exam  Constitutional: She is oriented to person, place, and time.  Obese, slightly dyspneic and tachypneic  HENT:  Head: Normocephalic and atraumatic.  Eyes: Conjunctivae and EOM are normal. Pupils are equal, round, and reactive to light.  Neck: Normal range of motion. Neck supple.  Cardiovascular: Normal rate and regular rhythm.   Pulmonary/Chest:  Bilateral expiratory wheezes and rhonchi  Abdominal: Soft. Bowel sounds are normal.  Musculoskeletal: Normal range of motion.   Neurological: She is alert and oriented to person, place, and time.  Skin: Skin is warm and dry.  Psychiatric: She has a normal mood and affect. Her behavior is normal.  Nursing note and vitals reviewed.   ED Course  Procedures (including critical care time) Labs Review Labs Reviewed  CBC - Abnormal; Notable for the following:    WBC 11.1 (*)    MCV 76.9 (*)    RDW 19.1 (*)    All other components within normal limits  COMPREHENSIVE METABOLIC PANEL - Abnormal; Notable for the following:    Sodium 132 (*)    Chloride 98 (*)    Glucose, Bld 121 (*)    BUN 44 (*)    Creatinine, Ser 2.03 (*)    Calcium 8.7 (*)    Albumin 3.4 (*)    ALT 11 (*)    GFR calc non Af Amer 25 (*)    GFR calc Af Amer 29 (*)    All other components within normal limits  TROPONIN I  BRAIN NATRIURETIC PEPTIDE    Imaging Review Dg Chest 2 View  05/29/2015  CLINICAL DATA:  Shortness of breath and cough. EXAM: CHEST  2 VIEW COMPARISON:  06/08/2014. FINDINGS: Trachea is midline. Heart size stable. Right-sided aortic arch. Probable minimal bibasilar scarring. There is nodular consolidation at the right lung base. Patchy consolidation in the right middle lobe. Streaky densities at the left lung base. Lungs are otherwise clear. No pleural fluid. IMPRESSION: 1. Nodular consolidation at the right lung base. While this may be infectious or inflammatory in etiology, a true pulmonary nodule cannot be excluded. Consider CT chest without contrast in 3-4 weeks to ensure resolution and exclude malignancy. 2. Patchy consolidation in the right middle lobe, suspicious for pneumonia. This can also be reassessed on follow-up CT imaging recommended above. 3. Streaky densities at the left lung base may be due to atelectasis/scarring. Electronically Signed   By: Leanna Battles M.D.   On: 05/29/2015 12:16   I have personally reviewed and evaluated these images and lab results as part of my medical decision-making.   EKG  Interpretation   Date/Time:  Thursday May 29 2015 11:15:19 EDT Ventricular Rate:  75 PR Interval:  149 QRS Duration: 99 QT Interval:  405 QTC Calculation: 452 R Axis:   47 Text Interpretation:  Sinus rhythm Abnormal R-wave progression, early  transition Nonspecific T abnrm, anterolateral leads Confirmed by Damarion Mendizabal  MD,  Rosamae Rocque ([id number]) on 05/29/2015 11:41:00 AM      MDM   Final diagnoses:  CAP (community acquired pneumonia)  Renal insufficiency  Pulmonary emphysema, unspecified emphysema type (HCC)    Chest x-ray reveals a right middle lobe pneumonia. There is also concern for a nodular consolidation at the right lung  base. Albuterol/Atrovent nebulizer treatment. IV Rocephin, IV Zithromax. Patient is " allergic" to steroids. Admit to general medicine.    Donnetta Hutching, MD 05/29/15 1303  Donnetta Hutching, MD 05/29/15 [phone]

## 2015-05-30 DIAGNOSIS — R7989 Other specified abnormal findings of blood chemistry: Secondary | ICD-10-CM | POA: Diagnosis present

## 2015-05-30 DIAGNOSIS — R739 Hyperglycemia, unspecified: Secondary | ICD-10-CM | POA: Diagnosis present

## 2015-05-30 LAB — BASIC METABOLIC PANEL
ANION GAP: 10 (ref 5–15)
BUN: 30 mg/dL — ABNORMAL HIGH (ref 6–20)
CALCIUM: 8.2 mg/dL — AB (ref 8.9–10.3)
CO2: 20 mmol/L — AB (ref 22–32)
CREATININE: 1.22 mg/dL — AB (ref 0.44–1.00)
Chloride: 108 mmol/L (ref 101–111)
GFR calc Af Amer: 53 mL/min — ABNORMAL LOW (ref >60)
GFR calc non Af Amer: 46 mL/min — ABNORMAL LOW (ref >60)
GLUCOSE: 233 mg/dL — AB (ref 65–99)
Potassium: 4 mmol/L (ref 3.5–5.1)
Sodium: 138 mmol/L (ref 135–145)

## 2015-05-30 LAB — CBC
HCT: 32.9 % — ABNORMAL LOW (ref 36.0–46.0)
HEMOGLOBIN: 11 g/dL — AB (ref 12.0–15.0)
MCH: 26.1 pg (ref 26.0–34.0)
MCHC: 33.4 g/dL (ref 30.0–36.0)
MCV: 78 fL (ref 78.0–100.0)
Platelets: 281 10*3/uL (ref 150–400)
RBC: 4.22 MIL/uL (ref 3.87–5.11)
RDW: 19.6 % — ABNORMAL HIGH (ref 11.5–15.5)
WBC: 7.7 10*3/uL (ref 4.0–10.5)

## 2015-05-30 LAB — GLUCOSE, CAPILLARY
GLUCOSE-CAPILLARY: 205 mg/dL — AB (ref 65–99)
Glucose-Capillary: 120 mg/dL — ABNORMAL HIGH (ref 65–99)
Glucose-Capillary: 143 mg/dL — ABNORMAL HIGH (ref 65–99)
Glucose-Capillary: 152 mg/dL — ABNORMAL HIGH (ref 65–99)

## 2015-05-30 LAB — TSH: TSH: 0.299 u[IU]/mL — ABNORMAL LOW (ref 0.350–4.500)

## 2015-05-30 MED ORDER — ENOXAPARIN SODIUM 40 MG/0.4ML ~~LOC~~ SOLN
40.0000 mg | SUBCUTANEOUS | Status: DC
  Administered 2015-05-30 – 2015-06-02 (×4): 40 mg via SUBCUTANEOUS
  Filled 2015-05-30 (×4): qty 0.4

## 2015-05-30 MED ORDER — HYDROCORTISONE NA SUCCINATE PF 100 MG IJ SOLR
100.0000 mg | Freq: Four times a day (QID) | INTRAMUSCULAR | Status: DC
  Administered 2015-05-30 – 2015-06-03 (×16): 100 mg via INTRAVENOUS
  Filled 2015-05-30 (×17): qty 2

## 2015-05-30 MED ORDER — INSULIN ASPART 100 UNIT/ML ~~LOC~~ SOLN
0.0000 [IU] | Freq: Every day | SUBCUTANEOUS | Status: DC
  Administered 2015-05-31: 0 [IU] via SUBCUTANEOUS

## 2015-05-30 MED ORDER — INSULIN GLARGINE 100 UNIT/ML ~~LOC~~ SOLN
10.0000 [IU] | Freq: Two times a day (BID) | SUBCUTANEOUS | Status: DC
  Administered 2015-05-30 – 2015-05-31 (×3): 10 [IU] via SUBCUTANEOUS
  Filled 2015-05-30 (×7): qty 0.1

## 2015-05-30 MED ORDER — INSULIN ASPART 100 UNIT/ML ~~LOC~~ SOLN
0.0000 [IU] | Freq: Three times a day (TID) | SUBCUTANEOUS | Status: DC
  Administered 2015-05-30: 3 [IU] via SUBCUTANEOUS
  Administered 2015-05-30: 7 [IU] via SUBCUTANEOUS
  Administered 2015-05-31: 3 [IU] via SUBCUTANEOUS
  Administered 2015-05-31: 4 [IU] via SUBCUTANEOUS
  Administered 2015-06-01 – 2015-06-03 (×3): 3 [IU] via SUBCUTANEOUS

## 2015-05-30 NOTE — Progress Notes (Addendum)
PROGRESS NOTE    Lauren Koch  ZOX:[id number] DOB: 01/09/1951 DOA: 05/29/2015 PCP: Frazier Richards, PA-C  Outpatient Specialists:    Brief Narrative:  Patient is a 65 year old woman with a history of COPD, tobacco abuse, hypertension, and GERD, who presented to the ED on 05/29/2015 with a chief complaint of shortness of breath. In the ED, she was initially hypoxic with [place] oxygen saturation of 86% on room air. She was afebrile and hypotensive with a blood pressure in the 80s systolically. Her lab data were significant for WBC of 11.1, sodium of 132, BUN of 44, creatinine of 2.03, and glucose of 121. Troponin I was negative and her BNP was only 34. Her chest x-ray revealed nodular consolidation at the right lung base, patchy consolidation in the right middle lobe suspicious for pneumonia, and streaky densities at the left lung base. She was admitted for COPD exacerbation and community acquired pneumonia.   Assessment & Plan:   Principal Problem:   CAP (community acquired pneumonia) Active Problems:   Acute respiratory failure with hypoxia (HCC)   COPD exacerbation (HCC)   Acute kidney injury (HCC)   Essential hypertension   Obesity   Tobacco abuse   Right leg pain   Hyperglycemia   Abnormal TSH   1. Acute respiratory with hypoxia secondary to community acquired pneumonia and COPD with exacerbation. In the ED, patient's chest x-ray noted for nodular pneumonia at the right lung base, patchy consolidation in the right middle lobe, and streaky densities at the left lung base. She was hypoxic with an oxygen saturation of 86% on room air. She was afebrile, but her white blood cell count was marginally elevated at 11.1. -She was started on oxygen, Rocephin and azithromycin.  She was started on DuoNeb every 4 hours and breakthrough when necessary albuterol nebulizer as needed. She was also given 2 g of magnesium sulfate empirically. -She needed the anti-inflammatory effects of a steroid, but  she reported intolerance or allergy to prednisone which had previously caused rash, palpitations, and possibly swelling of her throat. She was treated with Decadron before without side effects, so Decadron was started IV. -Discussed other steroid alternatives with pharmacy who eventually reviewed this with the patient. Patient stated that she had been on "cortisone " before without any side effects. Therefore, Decadron will be discontinued in favor of IV hydrocortisone. -For further evaluation, noncontrasted CT given the "nodulular "consolidation at the lung base. It revealed patchy bilateral peribronchial vascular nodularity, peribronchial thickening and consolidation indicative of infectious bronchiolitis or bronchopneumonia.  Tobacco abuse. The patient was strongly advised to stop smoking indefinitely. To her credit, she had not smoked for 5 days prior to admission. Tobacco cessation counseling was ordered. She declined the nicotine patch.  Hypertension with relative hypotension on admission. Patient is treated chronically with amlodipine, lisinopril, and HCTZ. She appeared to be volume depleted and her blood pressure was relatively low on admission. We'll continue to hold these medications as her blood pressure has improved, but is still on the low side of normal.  Acute kidney injury. Patient's creatinine on admission was 2.03 and her BUN was 44. Her creatinine from 06/2014 was within normal limits. She appeared to be volume depleted and the AKI was likely from dehydration with possible contribution from Lisinopril and HCTZ.  She was started on vigorous IV fluids and lisinopril and HCTZ were held. -Her BUN and creatinine have improved progressively. -We'll continue IV fluids and monitoring of her renal function.  Hyponatremia. Patient serum sodium was  132 on admission. She was started on vigorous IV fluids with normal saline. -Her serum sodium has improved. Etiology likely hypovolemic  hyponatremia. We'll continue to monitor.  Steroid-induced hyperglycemia. Patient's blood glucose has increased, likely from IV steroid s. Will start sliding scale NovoLog and Lantus; will titrate accordingly.  Right leg pain. We'll continue analgesics as needed. Ambulation as tolerated.   DVT prophylaxis: Lovenox Code Status: Full code Family Communication: Discussed with niece on 05/29/15. Disposition Plan: Discharge to home when clinically appropriate, likely in a few days.   Consultants:   None  Procedures:   None  Antimicrobials:  Rocephin 05/29/15>>  Azithromycin 05/29/15>>   Subjective: Patient acknowledges continued chest congestion and nonproductive cough. She is still short of breath, but she thinks she feels a little better than yesterday.  Objective: Filed Vitals:   05/29/15 2313 05/30/15 0648 05/30/15 0803 05/30/15 0808  BP:  111/64    Pulse:  73    Temp:  97.5 F (36.4 C)    TempSrc:  Oral    Resp:  20    Height:      Weight:      SpO2: 93% 90% 90% 90%    Intake/Output Summary (Last 24 hours) at 05/30/15 1051 Last data filed at 05/30/15 0650  Gross per 24 hour  Intake    240 ml  Output    900 ml  Net   -660 ml   Filed Weights   05/29/15 1105 05/29/15 1619  Weight: 90.719 kg (200 lb) 92.262 kg (203 lb 6.4 oz)    Examination:  General exam: 65 year old woman in no acute distress; looks better. Respiratory system: Diffuse expiratory rhonchus wheezing. Cardiovascular system: S1 & S2 no murmurs, rubs, or gallops. No pedal edema. Gastrointestinal system: Abdomen is nondistended, soft and nontender. No organomegaly or masses felt. Normal bowel sounds heard. Central nervous system: Alert and oriented. No focal neurological deficits. Extremities: Symmetric-no acute hot red joints. Skin: No rashes, lesions or ulcers Psychiatry: Judgement and insight appear normal. Mood & affect appropriate.     Data Reviewed: I have personally reviewed following  labs and imaging studies  CBC:  Recent Labs Lab 05/29/15 1115 05/30/15 0428  WBC 11.1* 7.7  HGB 12.3 11.0*  HCT 36.3 32.9*  MCV 76.9* 78.0  PLT 251 281   Basic Metabolic Panel:  Recent Labs Lab 05/29/15 1115 05/30/15 0428  NA 132* 138  K 4.2 4.0  CL 98* 108  CO2 22 20*  GLUCOSE 121* 233*  BUN 44* 30*  CREATININE 2.03* 1.22*  CALCIUM 8.7* 8.2*   GFR: Estimated Creatinine Clearance: 47.2 mL/min (by C-G formula based on Cr of 1.22). Liver Function Tests:  Recent Labs Lab 05/29/15 1115  AST 18  ALT 11*  ALKPHOS 83  BILITOT 0.6  PROT 7.9  ALBUMIN 3.4*   No results for input(s): LIPASE, AMYLASE in the last 168 hours. No results for input(s): AMMONIA in the last 168 hours. Coagulation Profile: No results for input(s): INR, PROTIME in the last 168 hours. Cardiac Enzymes:  Recent Labs Lab 05/29/15 1115  TROPONINI <0.03   BNP (last 3 results) No results for input(s): PROBNP in the last 8760 hours. HbA1C: No results for input(s): HGBA1C in the last 72 hours. CBG:  Recent Labs Lab 05/30/15 0816  GLUCAP 205*   Lipid Profile: No results for input(s): CHOL, HDL, LDLCALC, TRIG, CHOLHDL, LDLDIRECT in the last 72 hours. Thyroid Function Tests:  Recent Labs  05/30/15 0428  TSH 0.299*  Anemia Panel: No results for input(s): VITAMINB12, FOLATE, FERRITIN, TIBC, IRON, RETICCTPCT in the last 72 hours. Urine analysis: No results found for: COLORURINE, APPEARANCEUR, LABSPEC, PHURINE, GLUCOSEU, HGBUR, BILIRUBINUR, KETONESUR, PROTEINUR, UROBILINOGEN, NITRITE, LEUKOCYTESUR Sepsis Labs: @LABRCNTIP (procalcitonin:4,lacticidven:4)  )No results found for this or any previous visit (from the past 240 hour(s)).       Radiology Studies: Dg Chest 2 View  05/29/2015  CLINICAL DATA:  Shortness of breath and cough. EXAM: CHEST  2 VIEW COMPARISON:  06/08/2014. FINDINGS: Trachea is midline. Heart size stable. Right-sided aortic arch. Probable minimal bibasilar scarring.  There is nodular consolidation at the right lung base. Patchy consolidation in the right middle lobe. Streaky densities at the left lung base. Lungs are otherwise clear. No pleural fluid. IMPRESSION: 1. Nodular consolidation at the right lung base. While this may be infectious or inflammatory in etiology, a true pulmonary nodule cannot be excluded. Consider CT chest without contrast in 3-4 weeks to ensure resolution and exclude malignancy. 2. Patchy consolidation in the right middle lobe, suspicious for pneumonia. This can also be reassessed on follow-up CT imaging recommended above. 3. Streaky densities at the left lung base may be due to atelectasis/scarring. Electronically Signed   By: Leanna BattlesMelinda  Blietz M.D.   On: 05/29/2015 12:16   Ct Chest Wo Contrast  05/29/2015  CLINICAL DATA:  Shortness of breath and cough. EXAM: CT CHEST WITHOUT CONTRAST TECHNIQUE: Multidetector CT imaging of the chest was performed following the standard protocol without IV contrast. COMPARISON:  Chest radiograph 05/29/2015. FINDINGS: Mediastinum/Nodes: Right-sided aortic arch. Mediastinal lymph nodes measure up to 11 mm in the prevascular space. Hilar regions are difficult to definitively evaluate without IV contrast. Axillary lymph nodes are not enlarged. Coronary artery calcification. Heart is enlarged. No pericardial effusion. Lungs/Pleura: There is patchy peribronchovascular nodularity, peribronchial thickening and consolidation, worst in the right middle lobe, lingula and lower lobes. Image quality is degraded by respiratory motion. Focal consolidation in the lateral right lower lobe corresponds to the nodular area consolidation seen on today's chest radiograph. No pleural fluid. Airway is unremarkable. Upper abdomen: Visualized portions of the liver, gallbladder, adrenal glands, kidneys, spleen, pancreas, stomach and bowel are grossly unremarkable. No upper abdominal adenopathy. Musculoskeletal: No worrisome lytic or sclerotic  lesions. IMPRESSION: 1. Patchy bilateral peribronchovascular nodularity, peribronchial thickening and consolidation, indicative of an infectious bronchiolitis/bronchopneumonia. Consolidation in the lateral right lower lobe corresponds to the focal area of nodularity questioned on today's chest radiograph. 2. Mediastinal lymph nodes are likely reactive. 3. Coronary artery calcification. Electronically Signed   By: Leanna BattlesMelinda  Blietz M.D.   On: 05/29/2015 15:29        Scheduled Meds: . azithromycin  500 mg Intravenous Q24H  . cefTRIAXone (ROCEPHIN)  IV  1 g Intravenous Q24H  . dexamethasone  8 mg Intravenous Q6H  . enoxaparin (LOVENOX) injection  30 mg Subcutaneous Q24H  . insulin aspart  0-20 Units Subcutaneous TID WC  . insulin aspart  0-5 Units Subcutaneous QHS  . ipratropium-albuterol  3 mL Nebulization Q4H  . mometasone-formoterol  2 puff Inhalation BID  . pantoprazole  40 mg Oral Daily   Continuous Infusions: . sodium chloride 70 mL/hr at 05/30/15 0807     LOS: 1 day    Time spent: 35 minutes    Elliot CousinFISHER,Irja Wheless, MD [facility] Pager [phone]  If 7PM-7AM, please contact night-coverage [email or web address] Password [email or web address] 05/30/2015, 10:51 AM

## 2015-05-30 NOTE — Care Management Note (Signed)
Case Management Note  Patient Details  Name: Lauren MayShelia D Ouk MRN: [id number] Date of Birth: 10-Dec-1950  Subjective/Objective:   Spoke with patient who is alert from home and answers  Appropriately.  States ambulates without any assistance at home and has no DME. [facility]    Lives with husband who has health problems but is able to drive her to appointments as well as her adult daughter. She drives some but not often. Stated that she has been ambulating in the room and to the toilet while here with out assist. Does not use home O2 but is on it here. Koch NEED HOME O2. No other needs identified.            Action/Plan:Home with self care.   Expected Discharge Date:                  Expected Discharge Plan:  Home/Self Care  In-House Referral:     Discharge planning Services  CM Consult  Post Acute Care Choice:  NA Choice offered to:  NA  DME Arranged:    DME Agency:     HH Arranged:    HH Agency:     Status of Service:  In process, will continue to follow  Medicare Important Message Given:    Date Medicare IM Given:    Medicare IM give by:    Date Additional Medicare IM Given:    Additional Medicare Important Message give by:     If discussed at Long Length of Stay Meetings, dates discussed:    Additional Comments:  Adonis HugueninBerkhead, Mychaela Lennartz L, RN 05/30/2015, 3:47 PM

## 2015-05-31 LAB — BASIC METABOLIC PANEL
ANION GAP: 7 (ref 5–15)
BUN: 26 mg/dL — ABNORMAL HIGH (ref 6–20)
CALCIUM: 8.3 mg/dL — AB (ref 8.9–10.3)
CHLORIDE: 112 mmol/L — AB (ref 101–111)
CO2: 22 mmol/L (ref 22–32)
Creatinine, Ser: 0.93 mg/dL (ref 0.44–1.00)
GFR calc non Af Amer: >60 mL/min (ref >60)
Glucose, Bld: 162 mg/dL — ABNORMAL HIGH (ref 65–99)
Potassium: 3.4 mmol/L — ABNORMAL LOW (ref 3.5–5.1)
SODIUM: 141 mmol/L (ref 135–145)

## 2015-05-31 LAB — GLUCOSE, CAPILLARY
GLUCOSE-CAPILLARY: 170 mg/dL — AB (ref 65–99)
Glucose-Capillary: 113 mg/dL — ABNORMAL HIGH (ref 65–99)
Glucose-Capillary: 135 mg/dL — ABNORMAL HIGH (ref 65–99)
Glucose-Capillary: 143 mg/dL — ABNORMAL HIGH (ref 65–99)

## 2015-05-31 LAB — CBC
HEMATOCRIT: 33 % — AB (ref 36.0–46.0)
Hemoglobin: 10.7 g/dL — ABNORMAL LOW (ref 12.0–15.0)
MCH: 25.7 pg — ABNORMAL LOW (ref 26.0–34.0)
MCHC: 32.4 g/dL (ref 30.0–36.0)
MCV: 79.1 fL (ref 78.0–100.0)
PLATELETS: 311 10*3/uL (ref 150–400)
RBC: 4.17 MIL/uL (ref 3.87–5.11)
RDW: 19.9 % — ABNORMAL HIGH (ref 11.5–15.5)
WBC: 13.5 10*3/uL — AB (ref 4.0–10.5)

## 2015-05-31 LAB — T4, FREE: Free T4: 1.12 ng/dL (ref 0.61–1.12)

## 2015-05-31 LAB — HEMOGLOBIN A1C
Hgb A1c MFr Bld: 6.3 % — ABNORMAL HIGH (ref 4.8–5.6)
Mean Plasma Glucose: 134 mg/dL

## 2015-05-31 MED ORDER — INSULIN GLARGINE 100 UNIT/ML ~~LOC~~ SOLN
5.0000 [IU] | Freq: Two times a day (BID) | SUBCUTANEOUS | Status: DC
  Administered 2015-05-31 – 2015-06-03 (×6): 5 [IU] via SUBCUTANEOUS
  Filled 2015-05-31 (×8): qty 0.05

## 2015-05-31 MED ORDER — POTASSIUM CHLORIDE CRYS ER 20 MEQ PO TBCR
20.0000 meq | EXTENDED_RELEASE_TABLET | Freq: Two times a day (BID) | ORAL | Status: AC
  Administered 2015-05-31 (×2): 20 meq via ORAL
  Filled 2015-05-31 (×2): qty 1

## 2015-05-31 NOTE — Progress Notes (Addendum)
PROGRESS NOTE    Lauren MayShelia D Izard  BJY:[id number] DOB: 12-11-1950 DOA: 05/29/2015 PCP: Frazier RichardsIXON,MARY BETH, PA-C  Outpatient Specialists:    Brief Narrative:  Patient is a 65 year old woman with a history of COPD, tobacco abuse, hypertension, and GERD, who presented to the ED on 05/29/2015 with a chief complaint of shortness of breath. In the ED, she was initially hypoxic with [place] oxygen saturation of 86% on room air. She was afebrile and hypotensive with a blood pressure in the 80s systolically. Her lab data were significant for WBC of 11.1, sodium of 132, BUN of 44, creatinine of 2.03, and glucose of 121. Troponin I was negative and her BNP was only 34. Her chest x-ray revealed nodular consolidation at the right lung base, patchy consolidation in the right middle lobe suspicious for pneumonia, and streaky densities at the left lung base. She was admitted for COPD exacerbation and community acquired pneumonia.   Assessment & Plan:   Principal Problem:   CAP (community acquired pneumonia) Active Problems:   Acute respiratory failure with hypoxia (HCC)   COPD exacerbation (HCC)   Acute kidney injury (HCC)   Essential hypertension   Obesity   Tobacco abuse   Right leg pain   Hyperglycemia   Abnormal TSH   1. Acute respiratory with hypoxia secondary to community acquired pneumonia and COPD with exacerbation. In the ED, patient's chest x-ray noted for nodular pneumonia at the right lung base, patchy consolidation in the right middle lobe, and streaky densities at the left lung base. She was hypoxic with an oxygen saturation of 86% on room air. She was afebrile, but her white blood cell count was marginally elevated at 11.1. -She was started on oxygen, Rocephin and azithromycin.  She was started on DuoNeb every 4 hours and breakthrough when necessary albuterol nebulizer as needed. She was also given 2 g of magnesium sulfate empirically. She was continued on Advair Elwin Sleight(Dulera). -She needed the  anti-inflammatory effects of a steroid, but she reported intolerance or allergy to prednisone which had previously caused rash, palpitations, and possibly throat swelling. -She was started on Decadron since she had been treated with it before without side effects. Per conversation with the pharmacy, the patient had also tolerated "cortisone" in the past. Therefore Decadron was discontinued for IV Solu-Cortef. -Noncontrasted CT of the chest was ordered for evaluation of "nodulular "consolidation at the lung base on CXR. It revealed patchy bilateral peribronchial vascular nodularity, peribronchial thickening and consolidation indicative of infectious bronchiolitis or bronchopneumonia. -Patient continues to have extensive coarse wheezing.  Tobacco abuse. The patient was strongly advised to stop smoking indefinitely. To her credit, she had not smoked for 5 days prior to admission. Tobacco cessation counseling was ordered. She declined the nicotine patch.  Hypertension with relative hypotension on admission. Patient is treated chronically with amlodipine, lisinopril, and HCTZ. She appeared to be volume depleted and her blood pressure was relatively low on admission. We'll continue to hold these medications as her blood pressure has improved, but not hypertensive.  Acute kidney injury. Patient's creatinine on admission was 2.03 and her BUN was 44. Her creatinine from 06/2014 was within normal limits. She appeared to be volume depleted and the AKI was likely from dehydration with possible contribution from Lisinopril and HCTZ.  She was started on vigorous IV fluids and lisinopril and HCTZ were held. -Her BUN and creatinine have improved progressively. IV fluids were decreased.  Hyponatremia. Patient serum sodium was 132 on admission. She was started on vigorous IV fluids  with normal saline. -Her serum sodium has normalized. Etiology likely hypovolemic hyponatremia.  Steroid-induced  hyperglycemia. Patient's blood glucose has increased, likely from IV steroids. Sliding scale NovoLog and Lantus started. Will titrate accordingly. -Her hemoglobin A1c was 6.3.  Abnormal thyroid function test. Patient's TSH was 0.29. Free T4 pending.  Right leg pain. We'll continue analgesics as needed. Ambulation as tolerated.   DVT prophylaxis: Lovenox Code Status: Full code Family Communication: Discussed with niece on 05/29/15. Disposition Plan: Discharge to home when clinically appropriate, likely in a few days.   Consultants:   None  Procedures:   None  Antimicrobials:  Rocephin 05/29/15>>  Azithromycin 05/29/15>>   Subjective: Patient feels no short of breath than she did yesterday. She still has chest congestion.  Objective: Filed Vitals:   05/31/15 0642 05/31/15 0715 05/31/15 1109 05/31/15 1504  BP: 118/67     Pulse: 76     Temp: 97.6 F (36.4 C)     TempSrc: Oral     Resp: 20     Height:      Weight: 96.9 kg (213 lb 10 oz)     SpO2: 97% 98% 98% 96%    Intake/Output Summary (Last 24 hours) at 05/31/15 1646 Last data filed at 05/31/15 1328  Gross per 24 hour  Intake    240 ml  Output   1100 ml  Net   -860 ml   Filed Weights   05/29/15 1105 05/29/15 1619 05/31/15 0642  Weight: 90.719 kg (200 lb) 92.262 kg (203 lb 6.4 oz) 96.9 kg (213 lb 10 oz)    Examination:  General exam: 65 year old woman in no acute distress; looks better. Respiratory system: Diffuse expiratory rhonchus wheezing. Cardiovascular system: S1 & S2 no murmurs, rubs, or gallops. No pedal edema. Gastrointestinal system: Abdomen is nondistended, soft and nontender. No organomegaly or masses felt. Normal bowel sounds heard. Central nervous system: Alert and oriented. No focal neurological deficits. Extremities: Symmetric-no acute hot red joints. Skin: No rashes, lesions or ulcers Psychiatry: Judgement and insight appear normal. Mood & affect appropriate.     Data Reviewed: I  have personally reviewed following labs and imaging studies  CBC:  Recent Labs Lab 05/29/15 1115 05/30/15 0428 05/31/15 0601  WBC 11.1* 7.7 13.5*  HGB 12.3 11.0* 10.7*  HCT 36.3 32.9* 33.0*  MCV 76.9* 78.0 79.1  PLT 251 281 311   Basic Metabolic Panel:  Recent Labs Lab 05/29/15 1115 05/30/15 0428 05/31/15 0601  NA 132* 138 141  K 4.2 4.0 3.4*  CL 98* 108 112*  CO2 22 20* 22  GLUCOSE 121* 233* 162*  BUN 44* 30* 26*  CREATININE 2.03* 1.22* 0.93  CALCIUM 8.7* 8.2* 8.3*   GFR: Estimated Creatinine Clearance: 63.8 mL/min (by C-G formula based on Cr of 0.93). Liver Function Tests:  Recent Labs Lab 05/29/15 1115  AST 18  ALT 11*  ALKPHOS 83  BILITOT 0.6  PROT 7.9  ALBUMIN 3.4*   No results for input(s): LIPASE, AMYLASE in the last 168 hours. No results for input(s): AMMONIA in the last 168 hours. Coagulation Profile: No results for input(s): INR, PROTIME in the last 168 hours. Cardiac Enzymes:  Recent Labs Lab 05/29/15 1115  TROPONINI <0.03   BNP (last 3 results) No results for input(s): PROBNP in the last 8760 hours. HbA1C:  Recent Labs  05/30/15 0428  HGBA1C 6.3*   CBG:  Recent Labs Lab 05/30/15 1134 05/30/15 1648 05/30/15 2116 05/31/15 0731 05/31/15 1147  GLUCAP 120* 143* 152* 170*  113*   Lipid Profile: No results for input(s): CHOL, HDL, LDLCALC, TRIG, CHOLHDL, LDLDIRECT in the last 72 hours. Thyroid Function Tests:  Recent Labs  05/30/15 0428  TSH 0.299*   Anemia Panel: No results for input(s): VITAMINB12, FOLATE, FERRITIN, TIBC, IRON, RETICCTPCT in the last 72 hours. Urine analysis: No results found for: COLORURINE, APPEARANCEUR, LABSPEC, PHURINE, GLUCOSEU, HGBUR, BILIRUBINUR, KETONESUR, PROTEINUR, UROBILINOGEN, NITRITE, LEUKOCYTESUR Sepsis Labs: (procalcitonin:4,lacticidven:4)  )No results found for this or any previous visit (from the past 240 hour(s)).       Radiology Studies: No results  found.      Scheduled Meds: . azithromycin  500 mg Intravenous Q24H  . cefTRIAXone (ROCEPHIN)  IV  1 g Intravenous Q24H  . enoxaparin (LOVENOX) injection  40 mg Subcutaneous Q24H  . hydrocortisone sod succinate (SOLU-CORTEF) inj  100 mg Intravenous Q6H  . insulin aspart  0-20 Units Subcutaneous TID WC  . insulin aspart  0-5 Units Subcutaneous QHS  . insulin glargine  10 Units Subcutaneous BID  . ipratropium-albuterol  3 mL Nebulization Q4H  . mometasone-formoterol  2 puff Inhalation BID  . pantoprazole  40 mg Oral Daily  . potassium chloride  20 mEq Oral BID   Continuous Infusions: . sodium chloride 50 mL/hr at 05/31/15 1355     LOS: 2 days    Time spent: 35 minutes    Elliot Cousin, MD [facility] Pager [phone]  If 7PM-7AM, please contact night-coverage [email or web address] Password [facility] 05/31/2015, 4:46 PM

## 2015-05-31 NOTE — Progress Notes (Signed)
Patient ambulated in hallway from her to to room 316 and back to her room.  Patient had moderate exertion during walk, but was excited by opportunity to walk. Patient walked with minimal assistance and returned to room.  Will continue to monitor patient.

## 2015-06-01 LAB — BASIC METABOLIC PANEL
Anion gap: 8 (ref 5–15)
BUN: 18 mg/dL (ref 6–20)
CALCIUM: 8.4 mg/dL — AB (ref 8.9–10.3)
CO2: 23 mmol/L (ref 22–32)
CREATININE: 0.8 mg/dL (ref 0.44–1.00)
Chloride: 113 mmol/L — ABNORMAL HIGH (ref 101–111)
GFR calc non Af Amer: >60 mL/min (ref >60)
Glucose, Bld: 117 mg/dL — ABNORMAL HIGH (ref 65–99)
Potassium: 3.6 mmol/L (ref 3.5–5.1)
SODIUM: 144 mmol/L (ref 135–145)

## 2015-06-01 LAB — GLUCOSE, CAPILLARY
GLUCOSE-CAPILLARY: 129 mg/dL — AB (ref 65–99)
GLUCOSE-CAPILLARY: 117 mg/dL — AB (ref 65–99)
Glucose-Capillary: 136 mg/dL — ABNORMAL HIGH (ref 65–99)
Glucose-Capillary: 155 mg/dL — ABNORMAL HIGH (ref 65–99)

## 2015-06-01 MED ORDER — LISINOPRIL 10 MG PO TABS
10.0000 mg | ORAL_TABLET | Freq: Every day | ORAL | Status: DC
  Filled 2015-06-01: qty 1

## 2015-06-01 MED ORDER — AMLODIPINE BESYLATE 5 MG PO TABS
5.0000 mg | ORAL_TABLET | Freq: Every day | ORAL | Status: DC
  Administered 2015-06-01 – 2015-06-03 (×3): 5 mg via ORAL
  Filled 2015-06-01 (×3): qty 1

## 2015-06-01 NOTE — Progress Notes (Signed)
Pt ambulated in halls to room 315 and back to 327 with O2 at 2L  Pt tolerated the activity well.  O2 sat 96% and HR 84.  Returned to room and is sitting in a chair.

## 2015-06-01 NOTE — Progress Notes (Addendum)
PROGRESS NOTE    Lauren Koch  ZOX:[id number] DOB: 11-05-1950 DOA: 05/29/2015 PCP: Frazier RichardsIXON,MARY BETH, PA-C  Outpatient Specialists:    Brief Narrative:  Patient is a 65 year old woman with a history of COPD, tobacco abuse, hypertension, and GERD, who presented to the ED on 05/29/2015 with a chief complaint of shortness of breath. In the ED, she was initially hypoxic with us oxygen saturation of 86% on room air. She was afebrile and hypotensive with a blood pressure in the 80s systolically. Her lab data were significant for WBC of 11.1, sodium of 132, BUN of 44, creatinine of 2.03, and glucose of 121. Troponin I was negative and her BNP was only 34. Her chest x-ray revealed nodular consolidation at the right lung base, patchy consolidation in the right middle lobe suspicious for pneumonia, and streaky densities at the left lung base. She was admitted for COPD exacerbation and knee acquired pneumonia.   Assessment & Plan:   Principal Problem:   CAP (community acquired pneumonia) Active Problems:   Acute respiratory failure with hypoxia (HCC)   COPD exacerbation (HCC)   Acute kidney injury (HCC)   Essential hypertension   Obesity   Tobacco abuse   Right leg pain   Hyperglycemia   Abnormal TSH   1. Acute respiratory with hypoxia secondary to community acquired pneumonia and COPD with exacerbation. -Patient was started on oxygen, Rocephin and azithromycin.  She was started on DuoNeb every 4 hours and prn albuterol nebulizer as needed. She was also given 2 g of magnesium sulfate empirically. She was continued on Advair Elwin Sleight(Dulera). Flutter valve ordered on 06/01/15. -She needed the anti-inflammatory effects of a steroid, but she reported intolerance or allergy to prednisone which had previously caused rash, palpitations, and possibly throat swelling. -She was started on Decadron without side effects. Per conversation with the pharmacy, the patient had also tolerated "cortisone" in the past.  Therefore Decadron was discontinued in favor of IV Solu-Cortef. -Noncontrasted CT of the chest was ordered for evaluation of "nodulular "consolidation at the lung base on CXR. It revealed patchy bilateral peribronchial vascular nodularity, peribronchial thickening and consolidation indicative of infectious bronchiolitis or bronchopneumonia. -Patient continues to have extensive coarse wheezing; appears to be very slow improvement.  Tobacco abuse. The patient was strongly advised to stop smoking indefinitely. To her credit, she had not smoked for 5 days prior to admission. Tobacco cessation counseling was ordered. She declined the nicotine patch.  Hypertension with relative hypotension on admission. Patient is treated chronically with amlodipine, lisinopril, and HCTZ. She was volume depleted and her blood pressure was relatively low on admission. -Blood pressures trending up. Will restart amlodipine and lisinopril at a lower dose. IV fluids were decreased.  Acute kidney injury. Patient's creatinine on admission was 2.03 and her BUN was 44. Her creatinine from 06/2014 was within normal limits. She appeared to be volume depleted and the AKI was likely from dehydration with possible contribution from Lisinopril and HCTZ.  She was started on vigorous IV fluids and lisinopril and HCTZ were held. -Her BUN and creatinine have improved progressively. IV fluids were decreased.  Hyponatremia. Patient serum sodium was 132 on admission. She was started on vigorous IV fluids with normal saline. -Her serum sodium has normalized. Etiology was hypovolemic hyponatremia.  Steroid-induced hyperglycemia. Patient's blood glucose has increased, likely from IV steroids. Sliding scale NovoLog and Lantus started. Will titrate accordingly. -Her hemoglobin A1c was 6.3.  Abnormal thyroid function test. Patient's TSH was 0.29. Free T4 was within normal limits  at 1.1.  Right leg pain. We'll continue analgesics as  needed. Ambulation as tolerated.   DVT prophylaxis: Lovenox Code Status: Full code Family Communication: Discussed with niece on 05/29/15. Disposition Plan: Discharge to home when clinically appropriate, likely in a few days.   Consultants:   None  Procedures:   None  Antimicrobials:  Rocephin 05/29/15>>  Azithromycin 05/29/15>>   Subjective: Patient continues to acknowledge chest congestion and wheezing, but she was able to ambulate with the nursing staff last night which she appreciated although she was a little short of breath.   Objective: Filed Vitals:   06/01/15 0553 06/01/15 0737 06/01/15 1109 06/01/15 1501  BP: 150/79   155/80  Pulse: 77   78  Temp: 97.8 F (36.6 C)   98.2 F (36.8 C)  TempSrc: Oral   Oral  Resp: 20   20  Height:      Weight:      SpO2: 97% 97% 93% 98%    Intake/Output Summary (Last 24 hours) at 06/01/15 1605 Last data filed at 06/01/15 1500  Gross per 24 hour  Intake 1664.5 ml  Output    400 ml  Net 1264.5 ml   Filed Weights   05/29/15 1105 05/29/15 1619 05/31/15 0642  Weight: 90.719 kg (200 lb) 92.262 kg (203 lb 6.4 oz) 96.9 kg (213 lb 10 oz)    Examination:  General exam: 65 year old woman in no acute distress. Respiratory system: Diffuse expiratory rhonchus wheezing; possibly slightly less than yesterday. Cardiovascular system: S1 & S2 no murmurs, rubs, or gallops. No pedal edema. Gastrointestinal system: Abdomen is nondistended, soft and nontender. No organomegaly or masses felt. Normal bowel sounds heard. Central nervous system: Alert and oriented. No focal neurological deficits. Extremities: Symmetric-no acute hot red joints. Skin: No rashes, lesions or ulcers Psychiatry: Judgement and insight appear normal. Mood & affect appropriate.     Data Reviewed: I have personally reviewed following labs and imaging studies  CBC:  Recent Labs Lab 05/29/15 1115 05/30/15 0428 05/31/15 0601  WBC 11.1* 7.7 13.5*  HGB 12.3  11.0* 10.7*  HCT 36.3 32.9* 33.0*  MCV 76.9* 78.0 79.1  PLT 251 281 311   Basic Metabolic Panel:  Recent Labs Lab 05/29/15 1115 05/30/15 0428 05/31/15 0601 06/01/15 0619  NA 132* 138 141 144  K 4.2 4.0 3.4* 3.6  CL 98* 108 112* 113*  CO2 22 20* 22 23  GLUCOSE 121* 233* 162* 117*  BUN 44* 30* 26* 18  CREATININE 2.03* 1.22* 0.93 0.80  CALCIUM 8.7* 8.2* 8.3* 8.4*   GFR: Estimated Creatinine Clearance: 74.1 mL/min (by C-G formula based on Cr of 0.8). Liver Function Tests:  Recent Labs Lab 05/29/15 1115  AST 18  ALT 11*  ALKPHOS 83  BILITOT 0.6  PROT 7.9  ALBUMIN 3.4*   No results for input(s): LIPASE, AMYLASE in the last 168 hours. No results for input(s): AMMONIA in the last 168 hours. Coagulation Profile: No results for input(s): INR, PROTIME in the last 168 hours. Cardiac Enzymes:  Recent Labs Lab 05/29/15 1115  TROPONINI <0.03   BNP (last 3 results) No results for input(s): PROBNP in the last 8760 hours. HbA1C:  Recent Labs  05/30/15 0428  HGBA1C 6.3*   CBG:  Recent Labs Lab 05/31/15 1147 05/31/15 1647 05/31/15 2212 06/01/15 0759 06/01/15 1126  GLUCAP 113* 135* 143* 129* 117*   Lipid Profile: No results for input(s): CHOL, HDL, LDLCALC, TRIG, CHOLHDL, LDLDIRECT in the last 72 hours. Thyroid Function Tests:  Recent Labs  05/30/15 0428 05/31/15 0601  TSH 0.299*  --   FREET4  --  1.12   Anemia Panel: No results for input(s): VITAMINB12, FOLATE, FERRITIN, TIBC, IRON, RETICCTPCT in the last 72 hours. Urine analysis: No results found for: COLORURINE, APPEARANCEUR, LABSPEC, PHURINE, GLUCOSEU, HGBUR, BILIRUBINUR, KETONESUR, PROTEINUR, UROBILINOGEN, NITRITE, LEUKOCYTESUR Sepsis Labs: (procalcitonin:4,lacticidven:4)  )No results found for this or any previous visit (from the past 240 hour(s)).       Radiology Studies: No results found.      Scheduled Meds: . azithromycin  500 mg Intravenous Q24H  . cefTRIAXone  (ROCEPHIN)  IV  1 g Intravenous Q24H  . enoxaparin (LOVENOX) injection  40 mg Subcutaneous Q24H  . hydrocortisone sod succinate (SOLU-CORTEF) inj  100 mg Intravenous Q6H  . insulin aspart  0-20 Units Subcutaneous TID WC  . insulin aspart  0-5 Units Subcutaneous QHS  . insulin glargine  5 Units Subcutaneous BID  . ipratropium-albuterol  3 mL Nebulization Q4H  . mometasone-formoterol  2 puff Inhalation BID  . pantoprazole  40 mg Oral Daily   Continuous Infusions: . sodium chloride 50 mL/hr at 06/01/15 1322     LOS: 3 days    Time spent: 30 minutes    Elliot Cousin, MD [facility] Pager [phone]  If 7PM-7AM, please contact night-coverage [email or web address] Password [email or web address] 06/01/2015, 4:05 PM

## 2015-06-02 LAB — GLUCOSE, CAPILLARY
GLUCOSE-CAPILLARY: 143 mg/dL — AB (ref 65–99)
Glucose-Capillary: 101 mg/dL — ABNORMAL HIGH (ref 65–99)
Glucose-Capillary: 106 mg/dL — ABNORMAL HIGH (ref 65–99)
Glucose-Capillary: 100 mg/dL — ABNORMAL HIGH (ref 65–99)

## 2015-06-02 LAB — BASIC METABOLIC PANEL
Anion gap: 8 (ref 5–15)
BUN: 13 mg/dL (ref 6–20)
CHLORIDE: 108 mmol/L (ref 101–111)
CO2: 26 mmol/L (ref 22–32)
CREATININE: 0.67 mg/dL (ref 0.44–1.00)
Calcium: 8.2 mg/dL — ABNORMAL LOW (ref 8.9–10.3)
GFR calc non Af Amer: >60 mL/min (ref >60)
Glucose, Bld: 140 mg/dL — ABNORMAL HIGH (ref 65–99)
POTASSIUM: 3.3 mmol/L — AB (ref 3.5–5.1)
SODIUM: 142 mmol/L (ref 135–145)

## 2015-06-02 MED ORDER — HYDROCHLOROTHIAZIDE 12.5 MG PO CAPS
12.5000 mg | ORAL_CAPSULE | Freq: Every day | ORAL | Status: DC
  Administered 2015-06-02 – 2015-06-03 (×2): 12.5 mg via ORAL
  Filled 2015-06-02 (×2): qty 1

## 2015-06-02 MED ORDER — POTASSIUM CHLORIDE CRYS ER 20 MEQ PO TBCR
20.0000 meq | EXTENDED_RELEASE_TABLET | Freq: Two times a day (BID) | ORAL | Status: DC
  Administered 2015-06-02 – 2015-06-03 (×2): 20 meq via ORAL
  Filled 2015-06-02 (×2): qty 1

## 2015-06-02 MED ORDER — LISINOPRIL 10 MG PO TABS
20.0000 mg | ORAL_TABLET | Freq: Every day | ORAL | Status: DC
  Administered 2015-06-02 – 2015-06-03 (×2): 20 mg via ORAL
  Filled 2015-06-02 (×2): qty 2

## 2015-06-02 MED ORDER — POTASSIUM CHLORIDE CRYS ER 20 MEQ PO TBCR
20.0000 meq | EXTENDED_RELEASE_TABLET | Freq: Two times a day (BID) | ORAL | Status: DC
  Administered 2015-06-02: 20 meq via ORAL
  Filled 2015-06-02: qty 1

## 2015-06-02 MED ORDER — FAMOTIDINE 20 MG PO TABS: 20.0000 mg | ORAL_TABLET | Freq: Every day | ORAL | Status: DC

## 2015-06-02 MED ORDER — AZITHROMYCIN 250 MG PO TABS
500.0000 mg | ORAL_TABLET | Freq: Every day | ORAL | Status: DC
  Administered 2015-06-03: 500 mg via ORAL
  Filled 2015-06-02: qty 2

## 2015-06-02 MED ORDER — HYDRALAZINE HCL 20 MG/ML IJ SOLN: 5.0000 mg | INTRAMUSCULAR | Status: DC | PRN

## 2015-06-02 NOTE — Progress Notes (Signed)
PROGRESS NOTE    Lauren Koch  ZOX:[id number] DOB: 07-Dec-1950 DOA: 05/29/2015 PCP: Frazier Richards, PA-C  Outpatient Specialists:    Brief Narrative:  Patient is a 65 year old woman with a history of COPD, tobacco abuse, hypertension, and GERD, who presented to the ED on 05/29/2015 with a chief complaint of shortness of breath. In the ED, she was initially hypoxic with [place] oxygen saturation of 86% on room air. She was afebrile and hypotensive with a blood pressure in the 80s systolically. Her lab data were significant for WBC of 11.1, sodium of 132, BUN of 44, creatinine of 2.03, and glucose of 121. Troponin I was negative and her BNP was only 34. Her chest x-ray revealed nodular consolidation at the right lung base, patchy consolidation in the right middle lobe suspicious for pneumonia, and streaky densities at the left lung base. She was admitted for COPD exacerbation and community- acquired pneumonia.   Assessment & Plan:   Principal Problem:   CAP (community acquired pneumonia) Active Problems:   Acute respiratory failure with hypoxia (HCC)   COPD exacerbation (HCC)   Acute kidney injury (HCC)   Essential hypertension   Obesity   Tobacco abuse   Right leg pain   Hyperglycemia   Abnormal TSH   1. Acute respiratory with hypoxia secondary to community acquired pneumonia and COPD with exacerbation. -Patient was started on oxygen, Rocephin and azithromycin.  She was started on DuoNeb every 4 hours and prn albuterol nebulizer as needed. She was also given 2 g of magnesium sulfate empirically. She was continued on Advair Elwin Sleight). Flutter valve ordered on 06/01/15. -She needed the anti-inflammatory effects of a steroid, but she reported intolerance or allergy to prednisone which had previously caused rash, palpitations, and possibly throat swelling. -She was started on Decadron without side effects. Following the conversation with pharmacy and the patient, Solu-Cortef was started. She has  tolerated it. -Noncontrasted CT of the chest was ordered for evaluation of "nodulular "consolidation at the lung base on CXR. It revealed patchy bilateral peribronchial vascular nodularity, peribronchial thickening and consolidation indicative of infectious bronchiolitis or bronchopneumonia. -Patient continues to have extensive coarse wheezing, but finally, there appears to be some decrease. -Continue current management with recommended slow taper of steroids.  Tobacco abuse. The patient was strongly advised to stop smoking indefinitely. To her credit, she had not smoked for 5 days prior to admission. Tobacco cessation counseling was ordered. She declined the nicotine patch.  Hypertension with relative hypotension on admission. Patient is treated chronically with amlodipine, lisinopril, and HCTZ. She was volume depleted and her blood pressure was relatively low on admission. -Blood pressures trending up. Amlodipine and lisinopril were restarted and titrated up to the prehospital doses. HCTZ was restarted at a lower dose of 12.5 g daily. IV fluids were decreased.  Acute kidney injury. Patient's creatinine on admission was 2.03 and her BUN was 44. Her creatinine from 06/2014 was within normal limits. She appeared to be volume depleted and the AKI was likely from dehydration with possible contribution from Lisinopril and HCTZ.  She was started on vigorous IV fluids and lisinopril and HCTZ were held. -Her BUN and creatinine have improved progressively. IV fluids were decreased.  Hyponatremia. Patient serum sodium was 132 on admission. She was started on vigorous IV fluids with normal saline. -Her serum sodium has normalized. Etiology was hypovolemic hyponatremia. Hypokalemia. Patient serum potassium fell slightly to 3.3. Will supplement with potassium chloride.  Steroid-induced hyperglycemia. Patient's blood glucose has increased, likely from IV  steroids. Sliding scale NovoLog and Lantus were  started and titrated accordingly. -Her hemoglobin A1c was 6.3.  Abnormal thyroid function test. Patient's TSH was 0.29. Free T4 was within normal limits at 1.1.  Right leg pain. We'll continue analgesics as needed. Ambulation as tolerated.  Will add Pepcid empirically while she is on high-dose Solu-Cortef.   DVT prophylaxis: Lovenox Code Status: Full code Family Communication: Discussed with niece on 05/29/15. Disposition Plan: Discharge to home when clinically appropriate, likely in a few days.   Consultants:   None  Procedures:   None  Antimicrobials:  Rocephin 05/29/15>>  Azithromycin 05/29/15>>   Subjective: Patient stated that she feels better for the first time and feels that she is breathing a little better.  Objective: Filed Vitals:   06/02/15 0801 06/02/15 1013 06/02/15 1137 06/02/15 1326  BP:  156/81  162/74  Pulse:    84  Temp:    98.4 F (36.9 C)  TempSrc:    Oral  Resp:    18  Height:      Weight:      SpO2: 94%  98% 97%    Intake/Output Summary (Last 24 hours) at 06/02/15 1434 Last data filed at 06/02/15 0700  Gross per 24 hour  Intake   1200 ml  Output      0 ml  Net   1200 ml   Filed Weights   05/29/15 1619 05/31/15 0642 06/02/15 0500  Weight: 92.262 kg (203 lb 6.4 oz) 96.9 kg (213 lb 10 oz) 96.3 kg (212 lb 4.9 oz)    Examination:  General exam: 65 year old woman in no acute distress. Respiratory system: Diffuse expiratory rhonchus wheezing with some decrease in the extent of wheezing; better air movement. Cardiovascular system: S1 & S2 no murmurs, rubs, or gallops. No pedal edema. Gastrointestinal system: Abdomen is nondistended, soft and nontender. No organomegaly or masses felt. Normal bowel sounds heard. Central nervous system: Alert and oriented. No focal neurological deficits. Extremities: Symmetric-no acute hot red joints. Skin: No rashes, lesions or ulcers Psychiatry: Judgement and insight appear normal. Mood & affect  appropriate.     Data Reviewed: I have personally reviewed following labs and imaging studies  CBC:  Recent Labs Lab 05/29/15 1115 05/30/15 0428 05/31/15 0601  WBC 11.1* 7.7 13.5*  HGB 12.3 11.0* 10.7*  HCT 36.3 32.9* 33.0*  MCV 76.9* 78.0 79.1  PLT 251 281 311   Basic Metabolic Panel:  Recent Labs Lab 05/29/15 1115 05/30/15 0428 05/31/15 0601 06/01/15 0619 06/02/15 0412  NA 132* 138 141 144 142  K 4.2 4.0 3.4* 3.6 3.3*  CL 98* 108 112* 113* 108  CO2 22 20* 22 23 26   GLUCOSE 121* 233* 162* 117* 140*  BUN 44* 30* 26* 18 13  CREATININE 2.03* 1.22* 0.93 0.80 0.67  CALCIUM 8.7* 8.2* 8.3* 8.4* 8.2*   GFR: Estimated Creatinine Clearance: 73.8 mL/min (by C-G formula based on Cr of 0.67). Liver Function Tests:  Recent Labs Lab 05/29/15 1115  AST 18  ALT 11*  ALKPHOS 83  BILITOT 0.6  PROT 7.9  ALBUMIN 3.4*   No results for input(s): LIPASE, AMYLASE in the last 168 hours. No results for input(s): AMMONIA in the last 168 hours. Coagulation Profile: No results for input(s): INR, PROTIME in the last 168 hours. Cardiac Enzymes:  Recent Labs Lab 05/29/15 1115  TROPONINI <0.03   BNP (last 3 results) No results for input(s): PROBNP in the last 8760 hours. HbA1C: No results for input(s): HGBA1C in  the last 72 hours. CBG:  Recent Labs Lab 06/01/15 1126 06/01/15 1635 06/01/15 2052 06/02/15 0737 06/02/15 1126  GLUCAP 117* 136* 155* 101* 106*   Lipid Profile: No results for input(s): CHOL, HDL, LDLCALC, TRIG, CHOLHDL, LDLDIRECT in the last 72 hours. Thyroid Function Tests:  Recent Labs  05/31/15 0601  FREET4 1.12   Anemia Panel: No results for input(s): VITAMINB12, FOLATE, FERRITIN, TIBC, IRON, RETICCTPCT in the last 72 hours. Urine analysis: No results found for: COLORURINE, APPEARANCEUR, LABSPEC, PHURINE, GLUCOSEU, HGBUR, BILIRUBINUR, KETONESUR, PROTEINUR, UROBILINOGEN, NITRITE, LEUKOCYTESUR Sepsis  Labs: (procalcitonin:4,lacticidven:4)  )No results found for this or any previous visit (from the past 240 hour(s)).       Radiology Studies: No results found.      Scheduled Meds: . amLODipine  5 mg Oral Daily  . [START ON 06/03/2015] azithromycin  500 mg Oral Daily  . cefTRIAXone (ROCEPHIN)  IV  1 g Intravenous Q24H  . enoxaparin (LOVENOX) injection  40 mg Subcutaneous Q24H  . hydrochlorothiazide  12.5 mg Oral Daily  . hydrocortisone sod succinate (SOLU-CORTEF) inj  100 mg Intravenous Q6H  . insulin aspart  0-20 Units Subcutaneous TID WC  . insulin aspart  0-5 Units Subcutaneous QHS  . insulin glargine  5 Units Subcutaneous BID  . ipratropium-albuterol  3 mL Nebulization Q4H  . lisinopril  20 mg Oral Daily  . mometasone-formoterol  2 puff Inhalation BID  . pantoprazole  40 mg Oral Daily  . potassium chloride  20 mEq Oral BID   Continuous Infusions: . sodium chloride 10 mL/hr at 06/02/15 0927     LOS: 4 days    Time spent: 30 minutes    Elliot Cousin, MD [facility] Pager [phone]  If 7PM-7AM, please contact night-coverage [email or web address] Password [facility] 06/02/2015, 2:34 PM

## 2015-06-03 ENCOUNTER — Other Ambulatory Visit

## 2015-06-03 ENCOUNTER — Ambulatory Visit

## 2015-06-03 DIAGNOSIS — J439 Emphysema, unspecified: Secondary | ICD-10-CM | POA: Insufficient documentation

## 2015-06-03 LAB — GLUCOSE, CAPILLARY
Glucose-Capillary: 132 mg/dL — ABNORMAL HIGH (ref 65–99)
Glucose-Capillary: 81 mg/dL (ref 65–99)

## 2015-06-03 MED ORDER — CORTISONE ACETATE 25 MG PO TABS: ORAL_TABLET | ORAL | Status: DC

## 2015-06-03 MED ORDER — CEFUROXIME AXETIL 500 MG PO TABS: 500.0000 mg | ORAL_TABLET | Freq: Two times a day (BID) | ORAL | Status: DC

## 2015-06-03 MED ORDER — POTASSIUM CHLORIDE CRYS ER 20 MEQ PO TBCR: 20.0000 meq | EXTENDED_RELEASE_TABLET | Freq: Every day | ORAL | Status: DC

## 2015-06-03 NOTE — Progress Notes (Signed)
Lauren Koch discharged Home per MD order.  Discharge instructions reviewed and discussed with the patient, all questions and concerns answered. Copy of instructions and scripts given to patient.    Medication List    TAKE these medications        albuterol 108 (90 Base) MCG/ACT inhaler  Commonly known as:  PROVENTIL HFA;VENTOLIN HFA  [name]     amLODipine 5 MG tablet  Commonly known as:  NORVASC  Take 1 tablet (5 mg total) by mouth daily.     buPROPion 150 MG 12 hr tablet  Commonly known as:  WELLBUTRIN SR  Take one daily for 5 days then take 1 twice a day     cefUROXime 500 MG tablet  Commonly known as:  CEFTIN  Take 1 tablet (500 mg total) by mouth 2 (two) times daily with a meal. Starting 06/04/15 take antibiotic as prescribed until completed.     cortisone 25 MG tablet  Commonly known as:  CORTONE  Starting 06/04/15, take 4 tablets daily for 1 day; then 3 tablets the next day for 1 day; then 2 tablets the next day for 1 day; then 1 tablet the next day for 1 day; then stop. Take with food.     cyclobenzaprine 10 MG tablet  Commonly known as:  FLEXERIL  Take 1 tablet (10 mg total) by mouth 3 (three) times daily as needed for muscle spasms.     diphenhydrAMINE 25 MG tablet  Commonly known as:  BENADRYL  Take 25 mg by mouth every 6 (six) hours as needed for sleep.     Fluticasone-Salmeterol 250-50 MCG/DOSE Aepb  Commonly known as:  ADVAIR DISKUS  Inhale 1 puff into the lungs 2 (two) times daily.     hydrochlorothiazide 25 MG tablet  Commonly known as:  HYDRODIURIL  [name] Reported on 03/03/2015     lisinopril 20 MG tablet  Commonly known as:  PRINIVIL,ZESTRIL  Take 1 tablet (20 mg total) by mouth at bedtime. Reported on 03/03/2015     omeprazole 20 MG capsule  Commonly known as:  PRILOSEC  Take 1 capsule (20 mg total) by mouth daily.     potassium chloride SA 20 MEQ tablet  Commonly known as:  K-DUR,KLOR-CON  Take 1 tablet (20 mEq total) by mouth daily.     tiotropium 18 MCG inhalation capsule  Commonly known as:  SPIRIVA HANDIHALER  Place 1 capsule (18 mcg total) into inhaler and inhale daily.     Vitamin D (Ergocalciferol) 50000 units Caps capsule  Commonly known as:  DRISDOL  Take 1 capsule (50,000 Units total) by mouth every 7 (seven) days.        Patients skin is clean, dry and intact, no evidence of skin break down. IV site discontinued and catheter remains intact. Site without signs and symptoms of complications. Dressing and pressure applied.  Patient escorted to car by NT in a wheelchair,  no distress noted upon discharge.  [name] 06/03/2015 4:08 PM

## 2015-06-03 NOTE — Discharge Summary (Signed)
Physician Discharge Summary  Lauren Koch GNF:[id number] DOB: Nov 12, 1950 DOA: 05/29/2015  PCP: Frazier Richards, PA-C  Admit date: 05/29/2015 Discharge date: 06/03/2015  Time spent: Greater than 30 minutes  Recommendations for Outpatient Follow-up:  1. Patient was instructed to stop smoking indefinitely.   Discharge Diagnoses:  Principal Problem:   CAP (community acquired pneumonia) Active Problems:   Acute respiratory failure with hypoxia (HCC)   COPD exacerbation (HCC)   Acute kidney injury (HCC)   Essential hypertension   Obesity   Tobacco abuse   Right leg pain   Hyperglycemia   Abnormal TSH   Pulmonary emphysema (HCC)   Discharge Condition: Improved.  Diet recommendation: Heart healthy.  Filed Weights   05/31/15 0642 06/02/15 0500 06/03/15 0603  Weight: 96.9 kg (213 lb 10 oz) 96.3 kg (212 lb 4.9 oz) 96.662 kg (213 lb 1.6 oz)    History of present illness:  Patient is a 65 year old woman with a history of COPD, tobacco abuse, hypertension, and GERD, who presented to the ED on 05/29/2015 with a chief complaint of shortness of breath. In the ED, she was initially hypoxic with a oxygen saturation of 86% on room air. She was afebrile and hypotensive with a blood pressure in the 80s systolically. Her lab data were significant for WBC of 11.1, sodium of 132, BUN of 44, creatinine of 2.03, and glucose of 121. Troponin I was negative and her BNP was only 34. Her chest x-ray revealed nodular consolidation at the right lung base, patchy consolidation in the right middle lobe suspicious for pneumonia, and streaky densities at the left lung base. She was admitted for COPD exacerbation and community- acquired pneumonia.  Hospital Course:  1. Acute respiratory with hypoxia secondary to community acquired pneumonia and COPD with exacerbation. -Patient was started on oxygen, Rocephin and azithromycin. She was started on DuoNeb every 4 hours and prn albuterol nebulizer as needed. She was  also given 2 g of magnesium sulfate empirically. She was continued on Advair Elwin Sleight). Flutter valve ordered was ordered as well. -She needed the anti-inflammatory effects of a steroid, but she reported an intolerance or allergy to prednisone which had previously caused a rash, palpitations, and possibly throat swelling. -She was started on Decadron without side effects. Following the conversation with pharmacy and the patient, Solu-Cortef was started. She  tolerated it. -Noncontrasted CT of the chest was ordered for evaluation of "nodulular "consolidation at the lung base on the CXR. It revealed patchy bilateral peribronchial vascular nodularity, peribronchial thickening and consolidation indicative of infectious bronchiolitis or bronchopneumonia. -Patient continued to have extensive coarse wheezing, slowly, but eventually, the extent of her coarse wheezing subsided. -She improved clinically and symptomatically. Prior to hospital discharge, she ambulated with ease and on room air, her oxygen saturations remained in the low to mid 90s. She was discharged on an oral cortisone taper and 2 more days of Ceftin. She was instructed to continue Advair, Spiriva, and the albuterol inhaler when necessary.  Tobacco abuse. The patient was strongly advised to stop smoking indefinitely. To her credit, she had not smoked for 5 days prior to admission. Tobacco cessation counseling was ordered. She declined the nicotine patch.  Hypertension with relative hypotension on admission. Patient is treated chronically with amlodipine, lisinopril, and HCTZ. She was volume depleted and her blood pressure was relatively low on admission. -Blood pressure started trending up, which was likely secondary to systemic steroids. Amlodipine and lisinopril were restarted and titrated up to the prehospital doses. HCTZ was restarted at  a lower dose of 12.5 g daily and then increased back to the prehospital dose of 25 mg daily.  Acute kidney  injury. Patient's creatinine on admission was 2.03 and her BUN was 44. Her creatinine from 06/2014 was within normal limits. She appeared to be volume depleted and the AKI was likely from dehydration with possible contribution from Lisinopril and HCTZ.  She was started on vigorous IV fluids and lisinopril and HCTZ were held. -Her BUN and creatinine have improved progressively and normalized. Creatinine was 0.67 at the time of discharge.  Hyponatremia. Patient serum sodium was 132 on admission. She was started on vigorous IV fluids with normal saline. -Her serum sodium normalized. Etiology was hypovolemic hyponatremia. Hypokalemia. Patient serum potassium fell slightly to 3.3. She was started on potassium chloride supplementation and was discharged on 7 more days of treatment. With the restart of lisinopril, her serum potassium should improve over the next few days.  Steroid-induced hyperglycemia. Patient's blood glucose has increased, likely from IV steroids. Sliding scale NovoLog and Lantus were started and titrated accordingly. -Her hemoglobin A1c was 6.3.  Abnormal thyroid function test. Patient's TSH was 0.29. Her free T4 was within normal limits at 1.1.  Right leg pain. This was treated with analgesics as needed.    Procedures:  None  Consultations:  None  Discharge Exam: Filed Vitals:   06/02/15 2214 06/03/15 0603  BP: 134/68 160/81  Pulse: 65 75  Temp: 97.7 F (36.5 C) 98.1 F (36.7 C)  Resp: 18 18  Oxygen saturation 94% at rest and 94-98% with ambulation on room air.  General: Pleasant obese 65 year old woman in no acute distress. Cardiovascular: S1, S2, no murmurs rubs or gallops. Respiratory: Significant decrease in rhonchus wheezing; rare/occasional wheezes throughout lung fields, significantly improved. Breathing nonlabored.  Discharge Instructions   Discharge Instructions    Diet - low sodium heart healthy    Complete by:  As directed      Discharge  instructions    Complete by:  As directed   Did not smoke. Use inhalers as prescribed. Take medications as prescribed.     Increase activity slowly    Complete by:  As directed           Current Discharge Medication List    START taking these medications   Details  cefUROXime (CEFTIN) 500 MG tablet Take 1 tablet (500 mg total) by mouth 2 (two) times daily with a meal. Starting 06/04/15 take antibiotic as prescribed until completed. Qty: 4 tablet, Refills: 0    cortisone (CORTONE) 25 MG tablet Starting 06/04/15, take 4 tablets daily for 1 day; then 3 tablets the next day for 1 day; then 2 tablets the next day for 1 day; then 1 tablet the next day for 1 day; then stop. Take with food. Qty: 10 tablet, Refills: 0    potassium chloride SA (K-DUR,KLOR-CON) 20 MEQ tablet Take 1 tablet (20 mEq total) by mouth daily. Qty: 7 tablet, Refills: 0      CONTINUE these medications which have NOT CHANGED   Details  albuterol (PROVENTIL HFA;[name] [name] Qty: 1 Inhaler, Refills: 2   Associated Diagnoses: Asthma, mild intermittent, uncomplicated    amLODipine (NORVASC) 5 MG tablet Take 1 tablet (5 mg total) by mouth daily. Qty: 30 tablet, Refills: 5   Associated Diagnoses: Essential hypertension    buPROPion (WELLBUTRIN SR) 150  MG 12 hr tablet Take one daily for 5 days then take 1 twice a day Qty: 60 tablet, Refills: 4   Associated Diagnoses: Smoker    cyclobenzaprine (FLEXERIL) 10 MG tablet Take 1 tablet (10 mg total) by mouth 3 (three) times daily as needed for muscle spasms. Qty: 30 tablet, Refills: 1    diphenhydrAMINE (BENADRYL) 25 MG tablet Take 25 mg by mouth every 6 (six) hours as needed for sleep.    Fluticasone-Salmeterol (ADVAIR DISKUS) 250-50 MCG/DOSE AEPB Inhale 1 puff into the lungs 2 (two) times daily. Qty: 1 each, Refills: 3   Associated Diagnoses:  Pulmonary emphysema, unspecified emphysema type (HCC)    hydrochlorothiazide (HYDRODIURIL) 25 MG tablet [name] Reported on 03/03/2015 Qty: 30 tablet, Refills: 5   Associated Diagnoses: Essential hypertension    lisinopril (PRINIVIL,[name] Take 1 tablet (20 mg total) by mouth at bedtime. Reported on 03/03/2015 Qty: 30 tablet, Refills: 5   Associated Diagnoses: Essential hypertension    omeprazole (PRILOSEC) 20 MG capsule Take 1 capsule (20 mg total) by mouth daily. Qty: 30 capsule, Refills: 5   Associated Diagnoses: Gastroesophageal reflux disease, esophagitis presence not specified    tiotropium (SPIRIVA HANDIHALER) 18 MCG inhalation capsule Place 1 capsule (18 mcg total) into inhaler and inhale daily. Qty: 30 capsule, Refills: 5   Associated Diagnoses: Pulmonary emphysema, unspecified emphysema type (HCC)    Vitamin D, Ergocalciferol, (DRISDOL) 50000 units CAPS capsule Take 1 capsule (50,000 Units total) by mouth every 7 (seven) days. Qty: 12 capsule, Refills: 0   Associated Diagnoses: Vitamin D deficiency       Allergies  Allergen Reactions  . Prednisone Other (See Comments)    Arrhythmia, rash. Has tolerated hydrocortisone and decadron    Follow-up Information    Follow up with Allayne Butcher, PAC On 06/11/2015.   Why:  at 10:30 am   Contact information:   [address] [place] [address] [phone]       The results of significant diagnostics from this hospitalization (including imaging, microbiology, ancillary and laboratory) are listed below for reference.    Significant Diagnostic Studies: Dg Chest 2 View  05/29/2015  CLINICAL DATA:  Shortness of breath and cough. EXAM: CHEST  2 VIEW COMPARISON:  06/08/2014. FINDINGS: Trachea is midline. Heart size stable. Right-sided aortic arch. Probable minimal bibasilar scarring. There is nodular consolidation at the right lung base. Patchy consolidation in the right  middle lobe. Streaky densities at the left lung base. Lungs are otherwise clear. No pleural fluid. IMPRESSION: 1. Nodular consolidation at the right lung base. While this may be infectious or inflammatory in etiology, a true pulmonary nodule cannot be excluded. Consider CT chest without contrast in 3-4 weeks to ensure resolution and exclude malignancy. 2. Patchy consolidation in the right middle lobe, suspicious for pneumonia. This can also be reassessed on follow-up CT imaging recommended above. 3. Streaky densities at the left lung base may be due to atelectasis/scarring. Electronically Signed   By: Leanna Battles M.D.   On: 05/29/2015 12:16   Ct Chest Wo Contrast  05/29/2015  CLINICAL DATA:  Shortness of breath and cough. EXAM: CT CHEST WITHOUT CONTRAST TECHNIQUE: Multidetector CT imaging of the chest was performed following the standard protocol without IV contrast. COMPARISON:  Chest radiograph 05/29/2015. FINDINGS: Mediastinum/Nodes: Right-sided aortic arch. Mediastinal lymph nodes measure up to 11 mm in the prevascular space. Hilar regions are difficult to definitively evaluate without IV  contrast. Axillary lymph nodes are not enlarged. Coronary artery calcification. Heart is enlarged. No pericardial effusion. Lungs/Pleura: There is patchy peribronchovascular nodularity, peribronchial thickening and consolidation, worst in the right middle lobe, lingula and lower lobes. Image quality is degraded by respiratory motion. Focal consolidation in the lateral right lower lobe corresponds to the nodular area consolidation seen on today's chest radiograph. No pleural fluid. Airway is unremarkable. Upper abdomen: Visualized portions of the liver, gallbladder, adrenal glands, kidneys, spleen, pancreas, stomach and bowel are grossly unremarkable. No upper abdominal adenopathy. Musculoskeletal: No worrisome lytic or sclerotic lesions. IMPRESSION: 1. Patchy bilateral peribronchovascular nodularity, peribronchial  thickening and consolidation, indicative of an infectious bronchiolitis/bronchopneumonia. Consolidation in the lateral right lower lobe corresponds to the focal area of nodularity questioned on today's chest radiograph. 2. Mediastinal lymph nodes are likely reactive. 3. Coronary artery calcification. Electronically Signed   By: Melinda  BliLeanna Battlesetz M.D.   On: 05/29/2015 15:29    Microbiology: No results found for this or any previous visit (from the past 240 hour(s)).   Labs: Basic Metabolic Panel:  Recent Labs Lab 05/29/15 1115 05/30/15 0428 05/31/15 0601 06/01/15 0619 06/02/15 0412  NA 132* 138 141 144 142  K 4.2 4.0 3.4* 3.6 3.3*  CL 98* 108 112* 113* 108  CO2 22 20* 22 23 26   GLUCOSE 121* 233* 162* 117* 140*  BUN 44* 30* 26* 18 13  CREATININE 2.03* 1.22* 0.93 0.80 0.67  CALCIUM 8.7* 8.2* 8.3* 8.4* 8.2*   Liver Function Tests:  Recent Labs Lab 05/29/15 1115  AST 18  ALT 11*  ALKPHOS 83  BILITOT 0.6  PROT 7.9  ALBUMIN 3.4*   No results for input(s): LIPASE, AMYLASE in the last 168 hours. No results for input(s): AMMONIA in the last 168 hours. CBC:  Recent Labs Lab 05/29/15 1115 05/30/15 0428 05/31/15 0601  WBC 11.1* 7.7 13.5*  HGB 12.3 11.0* 10.7*  HCT 36.3 32.9* 33.0*  MCV 76.9* 78.0 79.1  PLT 251 281 311   Cardiac Enzymes:  Recent Labs Lab 05/29/15 1115  TROPONINI <0.03   BNP: BNP (last 3 results)  Recent Labs  06/08/14 1754 05/29/15 1115  BNP 109.0* 34.0    ProBNP (last 3 results) No results for input(s): PROBNP in the last 8760 hours.  CBG:  Recent Labs Lab 06/02/15 1126 06/02/15 1646 06/02/15 2059 06/03/15 0748 06/03/15 1139  GLUCAP 106* 100* 143* 132* 81       Signed:  Elfego Giammarino MD.  [facility] 06/03/2015, 1:29 PM

## 2015-06-03 NOTE — Progress Notes (Signed)
SATURATION QUALIFICATIONS: (This note is used to comply with regulatory documentation for home oxygen)  Patient Saturations on Room Air at Rest = 97%  Patient Saturations on Room Air while Ambulating = 96%  Patient Saturations on 2 Liters of oxygen while Ambulating = 98%

## 2015-06-06 ENCOUNTER — Telehealth: Payer: Self-pay | Admitting: Physician Assistant

## 2015-06-06 NOTE — Telephone Encounter (Signed)
Magic mouthwash 1 tsp po q 6 hrs prn

## 2015-06-06 NOTE — Telephone Encounter (Signed)
Got out of [facility] Tuesday.  The medications have her mouth so blistered it is hard for her to eat or drink.   Please advise??

## 2015-06-06 NOTE — Telephone Encounter (Signed)
Patient calling to speak to you regarding her hospital visit  [phone]

## 2015-06-09 MED ORDER — MAGIC MOUTHWASH: 5.0000 mL | Freq: Four times a day (QID) | ORAL | Status: DC

## 2015-06-09 NOTE — Telephone Encounter (Signed)
Left mess for pt RX sent

## 2015-06-11 ENCOUNTER — Encounter: Payer: Self-pay | Admitting: Physician Assistant

## 2015-06-11 ENCOUNTER — Ambulatory Visit (INDEPENDENT_AMBULATORY_CARE_PROVIDER_SITE_OTHER): Admitting: Physician Assistant

## 2015-06-11 VITALS — BP 112/80 | HR 72 | Temp 97.8°F | Resp 20 | Wt 210.0 lb

## 2015-06-11 DIAGNOSIS — J189 Pneumonia, unspecified organism: Secondary | ICD-10-CM

## 2015-06-11 DIAGNOSIS — J9601 Acute respiratory failure with hypoxia: Secondary | ICD-10-CM

## 2015-06-11 DIAGNOSIS — J441 Chronic obstructive pulmonary disease with (acute) exacerbation: Secondary | ICD-10-CM | POA: Diagnosis not present

## 2015-06-11 DIAGNOSIS — Z09 Encounter for follow-up examination after completed treatment for conditions other than malignant neoplasm: Secondary | ICD-10-CM

## 2015-06-11 NOTE — Progress Notes (Signed)
Patient ID: ARLANDA SHIPLETT MRN: [id number], DOB: 1950/08/31, 65 y.o. Date of Encounter: 06/11/2015, 11:15 AM    Chief Complaint:  Chief Complaint  Patient presents with  . hosp follow up    has quit smoking     HPI: 65 y.o. year old female presents for OV to f/u recent hospitalization.   She reports she has not smoked at all since hospitlaization. She had not smoked for 5 days prior to hospitalization and dinot smoke during hospitlaization. Encouraged her to saty off cigarettes and that she has gotten this far. She says she will not smoke again.   She did complete the antibioitc and prednisone and says she took her last potassium pill today.   Says her breathing is back to baseline. She is having no productive cough, no fever.   I reviewed entire discharge summary.      Home Meds:   Outpatient Prescriptions Prior to Visit  Medication Sig Dispense Refill  . albuterol (PROVENTIL HFA;[name] [name] 1 Inhaler 2  . amLODipine (NORVASC) 5 MG tablet Take 1 tablet (5 mg total) by mouth daily. 30 tablet 5  . buPROPion (WELLBUTRIN SR) 150 MG 12 hr tablet Take one daily for 5 days then take 1 twice a day 60 tablet 4  . cyclobenzaprine (FLEXERIL) 10 MG tablet Take 1 tablet (10 mg total) by mouth 3 (three) times daily as needed for muscle spasms. 30 tablet 1  . diphenhydrAMINE (BENADRYL) 25 MG tablet Take 25 mg by mouth every 6 (six) hours as needed for sleep.    Marland Kitchen Fluticasone-Salmeterol (ADVAIR DISKUS) 250-50 MCG/DOSE AEPB Inhale 1 puff into the lungs 2 (two) times daily. 1 each 3  . hydrochlorothiazide (HYDRODIURIL) 25 MG tablet [name] Reported on 03/03/2015 30 tablet 5  . lisinopril (PRINIVIL,[name] Take 1 tablet (20 mg total) by mouth at bedtime. Reported on 03/03/2015 30 tablet 5  . magic mouthwash SOLN Take 5  mLs by mouth every 6 (six) hours. 120 mL 0  . omeprazole (PRILOSEC) 20 MG capsule Take 1 capsule (20 mg total) by mouth daily. 30 capsule 5  . potassium chloride SA (K-DUR,KLOR-CON) 20 MEQ tablet Take 1 tablet (20 mEq total) by mouth daily. 7 tablet 0  . tiotropium (SPIRIVA HANDIHALER) 18 MCG inhalation capsule Place 1 capsule (18 mcg total) into inhaler and inhale daily. 30 capsule 5  . Vitamin D, Ergocalciferol, (DRISDOL) 50000 units CAPS capsule Take 1 capsule (50,000 Units total) by mouth every 7 (seven) days. 12 capsule 0  . cefUROXime (CEFTIN) 500 MG tablet Take 1 tablet (500 mg total) by mouth 2 (two) times daily with a meal. Starting 06/04/15 take antibiotic as prescribed until completed. 4 tablet 0  . cortisone (CORTONE) 25 MG tablet Starting 06/04/15, take 4 tablets daily for 1 day; then 3 tablets the next day for 1 day; then 2 tablets the next day for 1 day; then 1 tablet the next day for 1 day; then stop. Take with food. 10 tablet 0   No facility-administered medications prior to visit.    Allergies:  Allergies  Allergen Reactions  . Prednisone Other (See Comments)    Arrhythmia, rash. Has tolerated hydrocortisone and decadron       Review of Systems: See HPI for pertinent ROS. All other ROS negative.  Physical Exam: Blood pressure 112/80, pulse 72, temperature 97.8 F (36.6 C), temperature source Oral, resp. rate 20, weight 210 lb (95.255 kg)., Body mass index is 41.01 kg/(m^2). General:  Obese WF. Appears in no acute distress. Neck: Supple. No thyromegaly. No lymphadenopathy. Lungs: Breath sounds are distant and decreased but lungs are clear. No wheezing at all. No rhonchi or rales.  Heart: Regular rhythm. No murmurs, rubs, or gallops. Msk:  Strength and tone normal for age. Extremities/Skin: Warm and dry. 1+ LE edema.  Neuro: Alert and oriented X 3. Moves all extremities spontaneously. Gait is normal. CNII-XII grossly in tact. Psych:  Responds to questions appropriately  with a normal affect.     ASSESSMENT AND PLAN:  65 y.o. year old female with  1. Hospital discharge follow-up  2. CAP (community acquired pneumonia)  3. Acute respiratory failure with hypoxia (HCC)  4. COPD exacerbation (HCC)  She does have some LE edema but discussed with her that this will take time to resolve. Discussed that at admission BP was low, BUN/Creat were high----BP meds were held. Diuretic was held. She was givne fluids and prednisone.  She has completed all meds added at discharge.  She is on her routine Pulmonary meds.  F/U PRN   Signed, [facility] Surgery Center IncMary Beth DaguaoDixon, [place]PA, [facility] Center[facility] 06/11/2015 11:15 AM

## 2015-07-29 ENCOUNTER — Ambulatory Visit
Admission: RE | Admit: 2015-07-29 | Discharge: 2015-07-29 | Disposition: A | Discharge status: Home / Self Care | Source: Ambulatory Visit | Attending: Physician Assistant | Admitting: Physician Assistant

## 2015-07-29 DIAGNOSIS — Z780 Asymptomatic menopausal state: Secondary | ICD-10-CM

## 2015-07-29 DIAGNOSIS — Z0000 Encounter for general adult medical examination without abnormal findings: Secondary | ICD-10-CM

## 2015-07-29 DIAGNOSIS — F17200 Nicotine dependence, unspecified, uncomplicated: Secondary | ICD-10-CM

## 2015-07-29 DIAGNOSIS — E2839 Other primary ovarian failure: Secondary | ICD-10-CM

## 2015-07-29 DIAGNOSIS — Z1231 Encounter for screening mammogram for malignant neoplasm of breast: Secondary | ICD-10-CM

## 2015-07-31 ENCOUNTER — Other Ambulatory Visit: Payer: Self-pay | Admitting: Physician Assistant

## 2015-07-31 DIAGNOSIS — R928 Other abnormal and inconclusive findings on diagnostic imaging of breast: Secondary | ICD-10-CM

## 2015-08-08 ENCOUNTER — Ambulatory Visit
Admission: RE | Admit: 2015-08-08 | Discharge: 2015-08-08 | Disposition: A | Discharge status: Home / Self Care | Source: Ambulatory Visit | Attending: Physician Assistant | Admitting: Physician Assistant

## 2015-08-08 ENCOUNTER — Encounter: Payer: Self-pay | Admitting: *Deleted

## 2015-08-08 DIAGNOSIS — M8580 Other specified disorders of bone density and structure, unspecified site: Secondary | ICD-10-CM | POA: Insufficient documentation

## 2015-08-08 DIAGNOSIS — R928 Other abnormal and inconclusive findings on diagnostic imaging of breast: Secondary | ICD-10-CM

## 2015-08-18 ENCOUNTER — Telehealth: Payer: Self-pay | Admitting: *Deleted

## 2015-08-18 MED ORDER — ALENDRONATE SODIUM 70 MG PO TABS: 70.0000 mg | ORAL_TABLET | ORAL | Status: DC

## 2015-10-27 ENCOUNTER — Other Ambulatory Visit: Payer: Self-pay | Admitting: Physician Assistant

## 2015-10-27 DIAGNOSIS — I10 Essential (primary) hypertension: Secondary | ICD-10-CM

## 2016-03-03 ENCOUNTER — Other Ambulatory Visit: Payer: Self-pay | Admitting: Physician Assistant

## 2016-03-03 DIAGNOSIS — K219 Gastro-esophageal reflux disease without esophagitis: Secondary | ICD-10-CM

## 2016-03-03 NOTE — Telephone Encounter (Signed)
Rx filled per protocol  

## 2016-03-20 ENCOUNTER — Encounter (HOSPITAL_COMMUNITY): Payer: Self-pay | Admitting: Emergency Medicine

## 2016-03-20 ENCOUNTER — Inpatient Hospital Stay (HOSPITAL_COMMUNITY)
Admission: EM | Admit: 2016-03-20 | Discharge: 2016-03-23 | DRG: 041 | Disposition: A | Discharge status: Home / Self Care | Payer: Medicare Other | Attending: Neurology | Admitting: Neurology

## 2016-03-20 ENCOUNTER — Emergency Department (HOSPITAL_COMMUNITY): Payer: Medicare Other

## 2016-03-20 DIAGNOSIS — F17210 Nicotine dependence, cigarettes, uncomplicated: Secondary | ICD-10-CM | POA: Diagnosis present

## 2016-03-20 DIAGNOSIS — E7800 Pure hypercholesterolemia, unspecified: Secondary | ICD-10-CM | POA: Diagnosis present

## 2016-03-20 DIAGNOSIS — Z7951 Long term (current) use of inhaled steroids: Secondary | ICD-10-CM

## 2016-03-20 DIAGNOSIS — Z23 Encounter for immunization: Secondary | ICD-10-CM | POA: Diagnosis not present

## 2016-03-20 DIAGNOSIS — R29818 Other symptoms and signs involving the nervous system: Secondary | ICD-10-CM | POA: Diagnosis not present

## 2016-03-20 DIAGNOSIS — R531 Weakness: Secondary | ICD-10-CM | POA: Diagnosis not present

## 2016-03-20 DIAGNOSIS — R404 Transient alteration of awareness: Secondary | ICD-10-CM | POA: Diagnosis not present

## 2016-03-20 DIAGNOSIS — I6300 Cerebral infarction due to thrombosis of unspecified precerebral artery: Secondary | ICD-10-CM | POA: Diagnosis not present

## 2016-03-20 DIAGNOSIS — R402142 Coma scale, eyes open, spontaneous, at arrival to emergency department: Secondary | ICD-10-CM | POA: Diagnosis present

## 2016-03-20 DIAGNOSIS — G8194 Hemiplegia, unspecified affecting left nondominant side: Secondary | ICD-10-CM | POA: Diagnosis present

## 2016-03-20 DIAGNOSIS — R402362 Coma scale, best motor response, obeys commands, at arrival to emergency department: Secondary | ICD-10-CM | POA: Diagnosis present

## 2016-03-20 DIAGNOSIS — Z888 Allergy status to other drugs, medicaments and biological substances status: Secondary | ICD-10-CM

## 2016-03-20 DIAGNOSIS — Z6841 Body Mass Index (BMI) 40.0 and over, adult: Secondary | ICD-10-CM

## 2016-03-20 DIAGNOSIS — J449 Chronic obstructive pulmonary disease, unspecified: Secondary | ICD-10-CM | POA: Diagnosis present

## 2016-03-20 DIAGNOSIS — I6789 Other cerebrovascular disease: Secondary | ICD-10-CM | POA: Diagnosis not present

## 2016-03-20 DIAGNOSIS — E785 Hyperlipidemia, unspecified: Secondary | ICD-10-CM | POA: Diagnosis present

## 2016-03-20 DIAGNOSIS — I639 Cerebral infarction, unspecified: Secondary | ICD-10-CM | POA: Diagnosis not present

## 2016-03-20 DIAGNOSIS — R42 Dizziness and giddiness: Secondary | ICD-10-CM | POA: Diagnosis not present

## 2016-03-20 DIAGNOSIS — Z720 Tobacco use: Secondary | ICD-10-CM | POA: Diagnosis present

## 2016-03-20 DIAGNOSIS — R402252 Coma scale, best verbal response, oriented, at arrival to emergency department: Secondary | ICD-10-CM | POA: Diagnosis present

## 2016-03-20 DIAGNOSIS — I10 Essential (primary) hypertension: Secondary | ICD-10-CM | POA: Diagnosis present

## 2016-03-20 DIAGNOSIS — R200 Anesthesia of skin: Secondary | ICD-10-CM | POA: Diagnosis not present

## 2016-03-20 DIAGNOSIS — K219 Gastro-esophageal reflux disease without esophagitis: Secondary | ICD-10-CM | POA: Diagnosis present

## 2016-03-20 DIAGNOSIS — R29701 NIHSS score 1: Secondary | ICD-10-CM | POA: Diagnosis present

## 2016-03-20 DIAGNOSIS — Z8249 Family history of ischemic heart disease and other diseases of the circulatory system: Secondary | ICD-10-CM | POA: Diagnosis not present

## 2016-03-20 LAB — MRSA PCR SCREENING: MRSA BY PCR: NEGATIVE

## 2016-03-20 LAB — CBC
HEMATOCRIT: 39.8 % (ref 36.0–46.0)
HEMOGLOBIN: 13.4 g/dL (ref 12.0–15.0)
MCH: 28.3 pg (ref 26.0–34.0)
MCHC: 33.7 g/dL (ref 30.0–36.0)
MCV: 84 fL (ref 78.0–100.0)
Platelets: 362 10*3/uL (ref 150–400)
RBC: 4.74 MIL/uL (ref 3.87–5.11)
RDW: 14.1 % (ref 11.5–15.5)
WBC: 10.1 10*3/uL (ref 4.0–10.5)

## 2016-03-20 LAB — LIPID PANEL
Cholesterol: 211 mg/dL — ABNORMAL HIGH (ref 0–200)
HDL: 77 mg/dL (ref >40)
LDL CALC: 122 mg/dL — AB (ref 0–99)
Total CHOL/HDL Ratio: 2.7 RATIO
Triglycerides: 60 mg/dL (ref <150)
VLDL: 12 mg/dL (ref 0–40)

## 2016-03-20 LAB — COMPREHENSIVE METABOLIC PANEL
ALT: 17 U/L (ref 14–54)
AST: 24 U/L (ref 15–41)
Albumin: 4.1 g/dL (ref 3.5–5.0)
Alkaline Phosphatase: 74 U/L (ref 38–126)
Anion gap: 10 (ref 5–15)
BILIRUBIN TOTAL: 0.5 mg/dL (ref 0.3–1.2)
BUN: 17 mg/dL (ref 6–20)
CO2: 26 mmol/L (ref 22–32)
CREATININE: 1.22 mg/dL — AB (ref 0.44–1.00)
Calcium: 9.7 mg/dL (ref 8.9–10.3)
Chloride: 101 mmol/L (ref 101–111)
GFR calc Af Amer: 53 mL/min — ABNORMAL LOW (ref >60)
GFR, EST NON AFRICAN AMERICAN: 46 mL/min — AB (ref >60)
Glucose, Bld: 121 mg/dL — ABNORMAL HIGH (ref 65–99)
POTASSIUM: 4 mmol/L (ref 3.5–5.1)
Sodium: 137 mmol/L (ref 135–145)
TOTAL PROTEIN: 7.9 g/dL (ref 6.5–8.1)

## 2016-03-20 LAB — I-STAT TROPONIN, ED: TROPONIN I, POC: 0 ng/mL (ref 0.00–0.08)

## 2016-03-20 LAB — RAPID URINE DRUG SCREEN, HOSP PERFORMED
Amphetamines: NOT DETECTED
Barbiturates: NOT DETECTED
Benzodiazepines: NOT DETECTED
Cocaine: NOT DETECTED
Opiates: NOT DETECTED
Tetrahydrocannabinol: NOT DETECTED

## 2016-03-20 LAB — I-STAT CHEM 8, ED
BUN: 19 mg/dL (ref 6–20)
CALCIUM ION: 1.15 mmol/L (ref 1.15–1.40)
CREATININE: 1.3 mg/dL — AB (ref 0.44–1.00)
Chloride: 103 mmol/L (ref 101–111)
GLUCOSE: 121 mg/dL — AB (ref 65–99)
HCT: 41 % (ref 36.0–46.0)
Hemoglobin: 13.9 g/dL (ref 12.0–15.0)
Potassium: 4.3 mmol/L (ref 3.5–5.1)
Sodium: 138 mmol/L (ref 135–145)
TCO2: 26 mmol/L (ref 0–100)

## 2016-03-20 LAB — URINALYSIS, ROUTINE W REFLEX MICROSCOPIC
BILIRUBIN URINE: NEGATIVE
GLUCOSE, UA: NEGATIVE mg/dL
HGB URINE DIPSTICK: NEGATIVE
KETONES UR: NEGATIVE mg/dL
Leukocytes, UA: NEGATIVE
Nitrite: NEGATIVE
PROTEIN: NEGATIVE mg/dL
Specific Gravity, Urine: 1.01 (ref 1.005–1.030)
pH: 5 (ref 5.0–8.0)

## 2016-03-20 LAB — APTT: APTT: 27 s (ref 24–36)

## 2016-03-20 LAB — PROTIME-INR
INR: 0.89
Prothrombin Time: 12 seconds (ref 11.4–15.2)

## 2016-03-20 LAB — DIFFERENTIAL
BASOS ABS: 0 10*3/uL (ref 0.0–0.1)
Basophils Relative: 0 %
EOS ABS: 0.1 10*3/uL (ref 0.0–0.7)
Eosinophils Relative: 1 %
LYMPHS ABS: 1.7 10*3/uL (ref 0.7–4.0)
Lymphocytes Relative: 16 %
MONOS PCT: 9 %
Monocytes Absolute: 0.9 10*3/uL (ref 0.1–1.0)
Neutro Abs: 7.4 10*3/uL (ref 1.7–7.7)
Neutrophils Relative %: 74 %

## 2016-03-20 LAB — ETHANOL: Alcohol, Ethyl (B): <5 mg/dL (ref <5)

## 2016-03-20 MED ORDER — ACETAMINOPHEN 650 MG RE SUPP: 650.0000 mg | RECTAL | Status: DC | PRN

## 2016-03-20 MED ORDER — STROKE: EARLY STAGES OF RECOVERY BOOK
Freq: Once | Status: AC
  Administered 2016-03-20: 21:00:00
  Filled 2016-03-20: qty 1

## 2016-03-20 MED ORDER — ALTEPLASE 100 MG IV SOLR
INTRAVENOUS | Status: AC
  Filled 2016-03-20: qty 100

## 2016-03-20 MED ORDER — SODIUM CHLORIDE 0.9 % IV SOLN
INTRAVENOUS | Status: AC
  Administered 2016-03-20: 18:00:00 via INTRAVENOUS

## 2016-03-20 MED ORDER — PANTOPRAZOLE SODIUM 40 MG IV SOLR
40.0000 mg | Freq: Every day | INTRAVENOUS | Status: DC
  Administered 2016-03-20 – 2016-03-21 (×2): 40 mg via INTRAVENOUS
  Filled 2016-03-20 (×2): qty 40

## 2016-03-20 MED ORDER — ALTEPLASE (STROKE) FULL DOSE INFUSION
0.9000 mg/kg | Freq: Once | INTRAVENOUS | Status: AC
  Administered 2016-03-20: 82 mg via INTRAVENOUS

## 2016-03-20 MED ORDER — ACETAMINOPHEN 325 MG PO TABS
650.0000 mg | ORAL_TABLET | ORAL | Status: DC | PRN
  Filled 2016-03-20: qty 2

## 2016-03-20 MED ORDER — SODIUM CHLORIDE 0.9 % IV SOLN
50.0000 mL | Freq: Once | INTRAVENOUS | Status: AC
  Administered 2016-03-20: 50 mL via INTRAVENOUS

## 2016-03-20 MED ORDER — ACETAMINOPHEN 160 MG/5ML PO SOLN: 650.0000 mg | ORAL | Status: DC | PRN

## 2016-03-20 MED ORDER — INFLUENZA VAC SPLIT QUAD 0.5 ML IM SUSY
0.5000 mL | PREFILLED_SYRINGE | INTRAMUSCULAR | Status: AC
  Administered 2016-03-21: 0.5 mL via INTRAMUSCULAR
  Filled 2016-03-20: qty 0.5

## 2016-03-20 MED ORDER — SENNOSIDES-DOCUSATE SODIUM 8.6-50 MG PO TABS: 1.0000 | ORAL_TABLET | Freq: Every evening | ORAL | Status: DC | PRN

## 2016-03-20 NOTE — ED Triage Notes (Signed)
Patient brought in via EMS. Alert and oriented. Airway patent. Per EMS staff patient complaint was that she ate strawberries and started having nausea and vomiting. Patient given zofran with relief. According to patient she called EMS for sudden sudden numbness and weakness to left side of body. Patient states that she was dizzy upon standing like room was spinning and thought she was going to "pass out." Patient assessed by Dr Patria Maneampos, orders to be initiated, no code stroke to be called.

## 2016-03-20 NOTE — ED Notes (Signed)
[facility] here 

## 2016-03-20 NOTE — ED Notes (Signed)
Dr Pearlie OysterPaul Burke continues her evaluation- O2 probe changed due to extreme fluctuation-

## 2016-03-20 NOTE — ED Notes (Signed)
To cone

## 2016-03-20 NOTE — ED Provider Notes (Signed)
[facility]-EMER[facility]ENCY DEPT Provider Note   CSN: [id number] Arrival date & time: 03/20/16  1407   An emergency department physician performed an initial assessment on this suspected stroke patient at 31.  History   Chief Complaint Chief Complaint  Patient presents with  . Dizziness    HPI Lauren Koch is a 66 y.o. female.  HPI  Pt was seen at 1440. Per EMS and pt report, c/o sudden onset and persistence of constant left arm and leg "numbness and weakness" that began today approximately 1200/1230. Pt states she stood up from a chair and started to walk into another room when her symptoms began. Pt states she needed another family member to help her walk. Pt states she then sat on a bed "but kept sliding off" because of her left sided weakness and numbness. Pt describes the weakness and numbness as her entire LUE and LLE. Pt states she also experienced "blurry vision" and had one episode of N/V and "felt dizzy" but is unable to consistently further describe the sensation. States her left side continues to feel "numb and weak." Denies slurred speech, no facial droop, no CP/palpitations, no SOB/cough, no abd pain, no diarrhea, no black or blood in emesis, no fevers, no rash. EMS states they were called to scene for pt having N/V s/p eating strawberries and was given IV zofran en route with improvement.   Past Medical History:  Diagnosis Date  . Asthma   . Cataract   . COPD (chronic obstructive pulmonary disease) (HCC)   . GERD (gastroesophageal reflux disease)   . Hypertension   . Smoker     Patient Active Problem List   Diagnosis Date Noted  . Osteopenia 08/08/2015  . Pulmonary emphysema (HCC)   . Hyperglycemia 05/30/2015  . Abnormal TSH 05/30/2015  . CAP (community acquired pneumonia) 05/29/2015  . Acute respiratory failure with hypoxia (HCC) 05/29/2015  . COPD exacerbation (HCC) 05/29/2015  . Acute kidney injury (HCC) 05/29/2015  . Obesity 05/29/2015  . Tobacco abuse 05/29/2015    . Right leg pain 05/29/2015  . Essential hypertension 05/29/2015  . Vitamin D deficiency 04/17/2015  . Smoker 03/03/2015  . Asthma   . Cataract   . COPD (chronic obstructive pulmonary disease) (HCC)   . Hypertension   . GERD (gastroesophageal reflux disease)     Past Surgical History:  Procedure Laterality Date  . CESAREAN SECTION  1971/1976  . HERNIA REPAIR  2013  . PARTIAL HYMENECTOMY      OB History    Gravida Para Term Preterm AB Living   2 2 2     2    SAB TAB Ectopic Multiple Live Births                   Home Medications    Prior to Admission medications   Medication Sig Start Date End Date Taking? Authorizing Provider  albuterol (PROVENTIL HFA;[name] [name] 03/03/15   Patriciaann Clan Dixon, PA-C  alendronate (FOSAMAX) 70 MG tablet Take 1 tablet (70 mg total) by mouth every 7 (seven) days. Take with a full glass of water on an empty stomach. 08/18/15   Patriciaann Clan Dixon, PA-C  amLODipine (NORVASC) 5 MG tablet TAKE ONE TABLET BY MOUTH ONCE DAILY 10/27/15   Dorena Bodo, PA-C  buPROPion [facility] SR) 150 MG 12 hr tablet Take one daily for 5 days  then take 1 twice a day 04/02/15   Dorena BodoMary B Dixon, PA-C  cyclobenzaprine (FLEXERIL) 10 MG tablet Take 1 tablet (10 mg total) by mouth 3 (three) times daily as needed for muscle spasms. 04/04/15   Patriciaann ClanMary B Dixon, PA-C  diphenhydrAMINE (BENADRYL) 25 MG tablet Take 25 mg by mouth every 6 (six) hours as needed for sleep.    Historical Provider, MD  Fluticasone-Salmeterol (ADVAIR DISKUS) 250-50 MCG/DOSE AEPB Inhale 1 puff into the lungs 2 (two) times daily. 03/17/15   Patriciaann ClanMary B Dixon, PA-C  hydrochlorothiazide (HYDRODIURIL) 25 MG tablet TAKE ONE TABLET BY MOUTH ONCE DAILY 10/27/15   Dorena BodoMary B Dixon, PA-C  lisinopril (PRINIVIL,[name] TAKE ONE TABLET BY MOUTH AT BEDTIME 10/27/15   Dorena BodoMary B Dixon, PA-C  magic mouthwash SOLN Take 5  mLs by mouth every 6 (six) hours. 06/09/15   Donita BrooksWarren T Pickard, MD  omeprazole (PRILOSEC) 20 MG capsule Take 1 capsule (20 mg total) by mouth daily. 04/17/15   Patriciaann ClanMary B Dixon, PA-C  omeprazole (PRILOSEC) 20 MG capsule Take 1 capsule (20 mg total) by mouth daily. Patient need office visit before anymore refills 03/03/16   Dorena BodoMary B Dixon, PA-C  potassium chloride SA (K-DUR,KLOR-CON) 20 MEQ tablet Take 1 tablet (20 mEq total) by mouth daily. 06/03/15   Elliot Cousinenise Fisher, MD  tiotropium (SPIRIVA HANDIHALER) 18 MCG inhalation capsule Place 1 capsule (18 mcg total) into inhaler and inhale daily. 04/17/15   Patriciaann ClanMary B Dixon, PA-C  Vitamin D, Ergocalciferol, (DRISDOL) 50000 units CAPS capsule Take 1 capsule (50,000 Units total) by mouth every 7 (seven) days. 04/17/15   Dorena BodoMary B Dixon, PA-C    Family History Family History  Problem Relation Age of Onset  . Diabetes Mother   . Hypertension Mother   . Miscarriages / [place] Mother   . Heart disease Father   . Diabetes Father   . Hypertension Father   . Cancer Sister     skin  . Diabetes Other     Social History Social History  Substance Use Topics  . Smoking status: Former Smoker    Packs/day: 0.50    Years: 30.00    Types: Cigarettes  . Smokeless tobacco: Former NeurosurgeonUser    Quit date: 05/29/2015  . Alcohol use No     Allergies   Prednisone   Review of Systems Review of Systems ROS: Statement: All systems negative except as marked or noted in the HPI; Constitutional: Negative for fever and chills. ; ; Eyes: Negative for eye pain, redness and discharge. ; ; ENMT: Negative for ear pain, hoarseness, nasal congestion, sinus pressure and sore throat. ; ; Cardiovascular: Negative for chest pain, palpitations, diaphoresis, dyspnea and peripheral edema. ; ; Respiratory: Negative for cough, wheezing and stridor. ; ; Gastrointestinal: Negative for nausea, vomiting, diarrhea, abdominal pain, blood in stool, hematemesis, jaundice and rectal bleeding. . ; ; Genitourinary:  Negative for dysuria, flank pain and hematuria. ; ; Musculoskeletal: Negative for back pain and neck pain. Negative for swelling and trauma.; ; Skin: Negative for pruritus, rash, abrasions, blisters, bruising and skin lesion.; ; Neuro: +paresthesias, extremity weakness, "dizziness." Negative for headache and neck stiffness. Negative for altered level of consciousness, altered mental status, involuntary movement, seizure and syncope.      Physical Exam Updated Vital Signs Temp 97.8 F (36.6 C)   Ht 5\' 1"  (1.549 m)   Wt 200 lb (90.7 kg)   BMI 37.79 kg/m   Physical Exam 1445: Physical examination:  Nursing notes reviewed;  Vital signs and O2 SAT reviewed;  Constitutional: Well developed, Well nourished, Well hydrated, In no acute distress; Head:  Normocephalic, atraumatic; Eyes: EOMI, PERRL, No scleral icterus; ENMT: Mouth and pharynx normal, Mucous membranes moist; Neck: Supple, Full range of motion, No lymphadenopathy; Cardiovascular: Regular rate and rhythm, No gallop; Respiratory: Breath sounds clear & equal bilaterally, No wheezes.  Speaking full sentences with ease, Normal respiratory effort/excursion; Chest: Nontender, Movement normal; Abdomen: Soft, Nontender, Nondistended, Normal bowel sounds; Genitourinary: No CVA tenderness; Extremities: Pulses normal, No tenderness, No edema, No calf edema or asymmetry.; Neuro: AA&Ox3, rambling historian. Major CN grossly intact. Speech clear.  No facial droop. Left grip mildly weaker than right. Strength 5/5 RUE and RLE; 4/5 LUE and LLE.  +subjective decreased sensation left face, LUE and LLE; otherwise no gross sensory deficits.  Normal cerebellar testing bilat UE's (finger-nose) and LE's (heel-shin). ; Skin: Color normal, Warm, Dry.   ED Treatments / Results  Labs (all labs ordered are listed, but only abnormal results are displayed)   EKG  EKG Interpretation  Date/Time:  Saturday March 20 2016 14:38:06 EST Ventricular Rate:  72 PR  Interval:    QRS Duration: 119 QT Interval:  505 QTC Calculation: 553 R Axis:   47 Text Interpretation:  Normal sinus rhythm Nonspecific intraventricular conduction delay Low voltage, precordial leads Nonspecific T abnrm, anterolateral leads Prolonged QT Artifact When compared with ECG of 05/29/2015 QT has lengthened Otherwise no significant change Confirmed by [facility]  MD, Nicholos Johns [phone]) on 03/20/2016 2:56:00 PM       Radiology   Procedures Procedures (including critical care time)  Medications Ordered in ED Medications - No data to display   Initial Impression / Assessment and Plan / ED Course  I have reviewed the triage vital signs and the nursing notes.  Pertinent labs & imaging results that were available during my care of the patient were reviewed by me and considered in my medical decision making (see chart for details).  MDM Reviewed: previous chart, nursing note and vitals Reviewed previous: labs and ECG Interpretation: labs, ECG and CT scan Total time providing critical care: 30-74 minutes. This excludes time spent performing separately reportable procedures and services. Consults: neurology   CRITICAL CARE Performed by: Laray Anger Total critical care time: 35 minutes Critical care time was exclusive of separately billable procedures and treating other patients. Critical care was necessary to treat or prevent imminent or life-threatening deterioration. Critical care was time spent personally by me on the following activities: development of treatment plan with patient and/or surrogate as well as nursing, discussions with consultants, evaluation of patient's response to treatment, examination of patient, obtaining history from patient or surrogate, ordering and performing treatments and interventions, ordering and review of laboratory studies, ordering and review of radiographic studies, pulse oximetry and re-evaluation of patient's condition.   Results for  orders placed or performed during the hospital encounter of 03/20/16  Protime-INR  Result Value Ref Range   Prothrombin Time 12.0 11.4 - 15.2 seconds   INR 0.89   APTT  Result Value Ref Range   aPTT 27 24 - 36 seconds  CBC  Result Value Ref Range   WBC 10.1 4.0 - 10.5 K/uL   RBC 4.74 3.87 - 5.11 MIL/uL   Hemoglobin 13.4 12.0 - 15.0 g/dL   HCT 60.4 54.0 - 98.1 %   MCV 84.0 78.0 - 100.0 fL   MCH 28.3 26.0 - 34.0 pg   MCHC 33.7 30.0 - 36.0 g/dL  RDW 14.1 11.5 - 15.5 %   Platelets 362 150 - 400 K/uL  Differential  Result Value Ref Range   Neutrophils Relative % 74 %   Neutro Abs 7.4 1.7 - 7.7 K/uL   Lymphocytes Relative 16 %   Lymphs Abs 1.7 0.7 - 4.0 K/uL   Monocytes Relative 9 %   Monocytes Absolute 0.9 0.1 - 1.0 K/uL   Eosinophils Relative 1 %   Eosinophils Absolute 0.1 0.0 - 0.7 K/uL   Basophils Relative 0 %   Basophils Absolute 0.0 0.0 - 0.1 K/uL  Comprehensive metabolic panel  Result Value Ref Range   Sodium 137 135 - 145 mmol/L   Potassium 4.0 3.5 - 5.1 mmol/L   Chloride 101 101 - 111 mmol/L   CO2 26 22 - 32 mmol/L   Glucose, Bld 121 (H) 65 - 99 mg/dL   BUN 17 6 - 20 mg/dL   Creatinine, Ser 1.61 (H) 0.44 - 1.00 mg/dL   Calcium 9.7 8.9 - 09.6 mg/dL   Total Protein 7.9 6.5 - 8.1 g/dL   Albumin 4.1 3.5 - 5.0 g/dL   AST 24 15 - 41 U/L   ALT 17 14 - 54 U/L   Alkaline Phosphatase 74 38 - 126 U/L   Total Bilirubin 0.5 0.3 - 1.2 mg/dL   GFR calc non Af Amer 46 (L) >60 mL/min   GFR calc Af Amer 53 (L) >60 mL/min   Anion gap 10 5 - 15  Ethanol  Result Value Ref Range   Alcohol, Ethyl (B) <5 <5 mg/dL  I-stat troponin, ED  Result Value Ref Range   Troponin i, poc 0.00 0.00 - 0.08 ng/mL   Comment 3          I-Stat Chem 8, ED  Result Value Ref Range   Sodium 138 135 - 145 mmol/L   Potassium 4.3 3.5 - 5.1 mmol/L   Chloride 103 101 - 111 mmol/L   BUN 19 6 - 20 mg/dL   Creatinine, Ser 0.45 (H) 0.44 - 1.00 mg/dL   Glucose, Bld 409 (H) 65 - 99 mg/dL   Calcium, Ion  8.11 9.14 - 1.40 mmol/L   TCO2 26 0 - 100 mmol/L   Hemoglobin 13.9 12.0 - 15.0 g/dL   HCT 78.2 95.6 - 21.3 %   Ct Head Code Stroke W/o Cm Result Date: 03/20/2016 CLINICAL DATA:  Code stroke. Sudden numbness and weakness of the left side of body. EXAM: CT HEAD WITHOUT CONTRAST TECHNIQUE: Contiguous axial images were obtained from the base of the skull through the vertex without intravenous contrast. COMPARISON:  None. FINDINGS: Brain: No evidence of acute infarction, hemorrhage, hydrocephalus, extra-axial collection or mass lesion/mass effect. Minimal fat along the right fornix, incidental. Extensive low-density in the cerebral white matter consistent chronic microvascular disease. Small incidental retro cerebellar CSF collection. Vascular: Atherosclerotic calcification. Skull: Negative Sinuses/Orbits: Negative Other: These results were called by telephone at the time of interpretation on 03/20/2016 at 3:12 pm to Dr. Patria Mane, who verbally acknowledged these results. ASPECTS [facility] Stroke Program Early CT Score) - Ganglionic level infarction (caudate, lentiform nuclei, internal capsule, insula, M1-M3 cortex): 7 - Supraganglionic infarction (M4-M6 cortex): 3 Total score (0-10 with 10 being normal): 10 IMPRESSION: 1. No acute finding. ASPECTS is 10. 2. Extensive chronic microvascular disease in the cerebral white matter. Electronically Signed   By: Marnee Spring M.D.   On: 03/20/2016 15:15     1445:  Pt with 5 different versions of HPI (inital call  out, EMS version, Triage RN version, MSE MD version, and now another for me). States to me she was "dizzy" but unable to consistently explain what she means by "dizzy." Reported left sided "weakness" to me, in addition to her "numbness" and that she does not feel improved.  Based on my HPI (5th version); code stroke called.   1535:  [facility] Neuro Dr. Laurell Josephs has spoken with pt: pt now keeps to version of HPI that symptoms began at 1200 (because a particular TV show  was on) and that was her reason for calling EMS, not the N/V; [facility] MD has explained TPA risk/benefits to pt, pt makes her own medical decisions, pt verb understanding and wants to proceed with TPA, understanding the consequences of her decision.   1540:  T/C to [facility] Neuro Dr. Nicholas Lose, case discussed, including:  HPI, pertinent PM/SHx, VS/PE, dx testing, ED course and treatment:  Agreeable to accept transfer/admit, requests to write temporary orders, obtain Neuro ICU bed.    Final Clinical Impressions(s) / ED Diagnoses   Final diagnoses:  None    New Prescriptions New Prescriptions   No medications on file     Samuel Jester, DO 03/24/16 1618

## 2016-03-20 NOTE — ED Notes (Signed)
Dr Laurell JosephsBurke discussing with pt benefits and risks of TPa, pt reports she is willing to take - (there is some question regarding when symptoms began with pt now stating that she began having sx at 1200)

## 2016-03-20 NOTE — ED Notes (Signed)
Pt reports that she feels much better 

## 2016-03-20 NOTE — ED Notes (Signed)
TpAn oted to be disconnected fro IV on R hand reattached and Trey PaulaJeff, RN, [facility] notified- TpA restarted at 1620

## 2016-03-20 NOTE — ED Notes (Signed)
Report Darol DestineJeff [facility]

## 2016-03-20 NOTE — H&P (Signed)
Admission H&P  Lauren Koch is an 66 y.o. female.   Chief Complaint: Stroke HPI:  Patient was transferred from [facility].  At about 12:30 pm she had sudden onset of left sided numbness and weakness associated with nausea, dizziness, and vomiting.  She apparently changed the history several times delaying her evaluation in the ER, but received IV tPA bolus at 15:51.  Somehow, the IV tubing disconnected and she did not get the whole infusion over the next hour.  EMS estimates about 1/3 may have gone in.   However, en route the EMS called me and told me that her weakness has resolved and she has only mild residual subjective numbness.  CT at OSH showed no bleed, but there was extensive periventricular small vessel ischemic disease.  CBC, CMP, and coags were normal at OSH except for Cr 1.22 and GFR 46.  Her known stroke risk factors are HTN, smoking 1/2 ppd, and high cholesterol.  She believes that she has had glucose > 200 at some point, but no one has officially diagnosed her with DM.   She passed the swallow study at OSH.    Past Medical History:  Diagnosis Date  . Asthma   . Cataract   . COPD (chronic obstructive pulmonary disease) ([place])   . GERD (gastroesophageal reflux disease)   . Hypertension   . Smoker     Past Surgical History:  Procedure Laterality Date  . CESAREAN SECTION  1971/1976  . HERNIA REPAIR  2013  . PARTIAL HYMENECTOMY      Family History  Problem Relation Age of Onset  . Diabetes Mother   . Hypertension Mother   . Miscarriages / [place] Mother   . Heart disease Father   . Diabetes Father   . Hypertension Father   . Cancer Sister     skin  . Diabetes Other    Social History:  reports that she has been smoking Cigarettes.  She has a 15.00 pack-year smoking history. She quit smokeless tobacco use about 9 months ago. She reports that she does not drink alcohol or use drugs.  Allergies:  Allergies  Allergen Reactions  . Prednisone Other (See  Comments)    Arrhythmia, rash. Has tolerated hydrocortisone and decadron     Medications Prior to Admission  Medication Sig Dispense Refill  . albuterol (PROVENTIL HFA;[name] [name] 1 Inhaler 2  . alendronate (FOSAMAX) 70 MG tablet Take 1 tablet (70 mg total) by mouth every 7 (seven) days. Take with a full glass of water on an empty stomach. 4 tablet 3  . amLODipine (NORVASC) 5 MG tablet TAKE ONE TABLET BY MOUTH ONCE DAILY 30 tablet 5  . hydrochlorothiazide (HYDRODIURIL) 25 MG tablet TAKE ONE TABLET BY MOUTH ONCE DAILY 30 tablet 5  . lisinopril (PRINIVIL,[name] TAKE ONE TABLET BY MOUTH AT BEDTIME 30 tablet 5  . omeprazole (PRILOSEC) 20 MG capsule Take 1 capsule (20 mg total) by mouth daily. Patient need office visit before anymore refills 30 capsule 0  . buPROPion (WELLBUTRIN SR) 150 MG 12 hr tablet Take one daily for 5 days then take 1 twice a day 60 tablet 4  . cyclobenzaprine (FLEXERIL) 10 MG tablet Take 1 tablet (10 mg total) by mouth 3 (three) times daily as needed for muscle spasms. 30 tablet 1  . diphenhydrAMINE (BENADRYL) 25 MG  tablet Take 25 mg by mouth every 6 (six) hours as needed for sleep.    Marland Kitchen Fluticasone-Salmeterol (ADVAIR DISKUS) 250-50 MCG/DOSE AEPB Inhale 1 puff into the lungs 2 (two) times daily. 1 each 3  . potassium chloride SA (K-DUR,KLOR-CON) 20 MEQ tablet Take 1 tablet (20 mEq total) by mouth daily. 7 tablet 0  . tiotropium (SPIRIVA HANDIHALER) 18 MCG inhalation capsule Place 1 capsule (18 mcg total) into inhaler and inhale daily. 30 capsule 5  . Vitamin D, Ergocalciferol, (DRISDOL) 50000 units CAPS capsule Take 1 capsule (50,000 Units total) by mouth every 7 (seven) days. 12 capsule 0    Results for orders placed or performed during the hospital encounter of 03/20/16 (from the past 48 hour(s))  Protime-INR     Status: None    Collection Time: 03/20/16  2:39 PM  Result Value Ref Range   Prothrombin Time 12.0 11.4 - 15.2 seconds   INR 0.89   APTT     Status: None   Collection Time: 03/20/16  2:39 PM  Result Value Ref Range   aPTT 27 24 - 36 seconds  CBC     Status: None   Collection Time: 03/20/16  2:39 PM  Result Value Ref Range   WBC 10.1 4.0 - 10.5 K/uL   RBC 4.74 3.87 - 5.11 MIL/uL   Hemoglobin 13.4 12.0 - 15.0 g/dL   HCT 39.8 36.0 - 46.0 %   MCV 84.0 78.0 - 100.0 fL   MCH 28.3 26.0 - 34.0 pg   MCHC 33.7 30.0 - 36.0 g/dL   RDW 14.1 11.5 - 15.5 %   Platelets 362 150 - 400 K/uL  Differential     Status: None   Collection Time: 03/20/16  2:39 PM  Result Value Ref Range   Neutrophils Relative % 74 %   Neutro Abs 7.4 1.7 - 7.7 K/uL   Lymphocytes Relative 16 %   Lymphs Abs 1.7 0.7 - 4.0 K/uL   Monocytes Relative 9 %   Monocytes Absolute 0.9 0.1 - 1.0 K/uL   Eosinophils Relative 1 %   Eosinophils Absolute 0.1 0.0 - 0.7 K/uL   Basophils Relative 0 %   Basophils Absolute 0.0 0.0 - 0.1 K/uL  Comprehensive metabolic panel     Status: Abnormal   Collection Time: 03/20/16  2:39 PM  Result Value Ref Range   Sodium 137 135 - 145 mmol/L   Potassium 4.0 3.5 - 5.1 mmol/L   Chloride 101 101 - 111 mmol/L   CO2 26 22 - 32 mmol/L   Glucose, Bld 121 (H) 65 - 99 mg/dL   BUN 17 6 - 20 mg/dL   Creatinine, Ser 1.22 (H) 0.44 - 1.00 mg/dL   Calcium 9.7 8.9 - 10.3 mg/dL   Total Protein 7.9 6.5 - 8.1 g/dL   Albumin 4.1 3.5 - 5.0 g/dL   AST 24 15 - 41 U/L   ALT 17 14 - 54 U/L   Alkaline Phosphatase 74 38 - 126 U/L   Total Bilirubin 0.5 0.3 - 1.2 mg/dL   GFR calc non Af Amer 46 (L) >60 mL/min   GFR calc Af Amer 53 (L) >60 mL/min    Comment: (NOTE) The eGFR has been calculated using the CKD EPI equation. This calculation has not been validated in all clinical situations. eGFR's persistently <60 mL/min signify possible Chronic Kidney Disease.    Anion gap 10 5 - 15  Ethanol     Status: None   Collection Time:  03/20/16  2:44 PM  Result Value Ref Range   Alcohol, Ethyl (B) <5 <5 mg/dL    Comment:        LOWEST DETECTABLE LIMIT FOR SERUM ALCOHOL IS 5 mg/dL FOR MEDICAL PURPOSES ONLY   I-stat troponin, ED     Status: None   Collection Time: 03/20/16  2:54 PM  Result Value Ref Range   Troponin i, poc 0.00 0.00 - 0.08 ng/mL   Comment 3            Comment: Due to the release kinetics of cTnI, a negative result within the first hours of the onset of symptoms does not rule out myocardial infarction with certainty. If myocardial infarction is still suspected, repeat the test at appropriate intervals.   I-Stat Chem 8, ED     Status: Abnormal   Collection Time: 03/20/16  2:56 PM  Result Value Ref Range   Sodium 138 135 - 145 mmol/L   Potassium 4.3 3.5 - 5.1 mmol/L   Chloride 103 101 - 111 mmol/L   BUN 19 6 - 20 mg/dL   Creatinine, Ser 1.30 (H) 0.44 - 1.00 mg/dL   Glucose, Bld 121 (H) 65 - 99 mg/dL   Calcium, Ion 1.15 1.15 - 1.40 mmol/L   TCO2 26 0 - 100 mmol/L   Hemoglobin 13.9 12.0 - 15.0 g/dL   HCT 41.0 36.0 - 46.0 %   Ct Head Code Stroke W/o Cm  Result Date: 03/20/2016 CLINICAL DATA:  Code stroke. Sudden numbness and weakness of the left side of body. EXAM: CT HEAD WITHOUT CONTRAST TECHNIQUE: Contiguous axial images were obtained from the base of the skull through the vertex without intravenous contrast. COMPARISON:  None. FINDINGS: Brain: No evidence of acute infarction, hemorrhage, hydrocephalus, extra-axial collection or mass lesion/mass effect. Minimal fat along the right fornix, incidental. Extensive low-density in the cerebral white matter consistent chronic microvascular disease. Small incidental retro cerebellar CSF collection. Vascular: Atherosclerotic calcification. Skull: Negative Sinuses/Orbits: Negative Other: These results were called by telephone at the time of interpretation on 03/20/2016 at 3:12 pm to Dr. Venora Maples, who verbally acknowledged these results. ASPECTS [facility] Stroke  Program Early CT Score) - Ganglionic level infarction (caudate, lentiform nuclei, internal capsule, insula, M1-M3 cortex): 7 - Supraganglionic infarction (M4-M6 cortex): 3 Total score (0-10 with 10 being normal): 10 IMPRESSION: 1. No acute finding. ASPECTS is 10. 2. Extensive chronic microvascular disease in the cerebral white matter. Electronically Signed   By: Monte Fantasia M.D.   On: 03/20/2016 15:15    ROS  Physical Examination: Blood pressure 126/74, pulse 71, temperature 97.9 F (36.6 C), resp. rate 20, height '5\' 1"'$  (1.549 m), weight 90.7 kg (200 lb), SpO2 99 %.  HEENT - Normocephalic, without obvious abnormality, atraumatic, negative, conjunctivae/corneas clear. PERRL, EOM's intact. Fundi benign., normal TM's and external ear canals both ears, normal, normal Cardiovascular - regular rate and rhythm, S1, S2 normal, no murmur, click, rub or gallop Lungs - chest clear, no wheezing, rales, normal symmetric air entry, Heart exam - S1, S2 normal, no murmur, no gallop, rate regular Abdomen - soft, non-tender; bowel sounds normal; no masses,  no organomegaly Extremities - less then 2 second capillary refill  Neurologic Examination:  Awake, alert, fully oriented. Language- fluent Comprehension, naming, repetition - intact. PERL. EOMI. Tongue midline.  Face symmetrical. Strength 5/5 bilateral UE and LE. Coord- FTN and HTS intact bil. Sensory- decreased light touch and pinprick on the left arm and leg, but normal in the face. Gait-deferred.  NIHSS 1  A/P: Possible mild right subcortical ischemic stroke.  I doubt that the IV tPA was that effective to take away her weakness so fast, but I will not re-start the rest that she missed as her NIHSS is so low and not worth the risks.     Her BP is well controlled.  I will check HgA1c to see if she has elevated glucose over longer period of time.    Repeat CT in 24  Hours and start ASA 81 mg qd if negative for hemorrhagic conversion.  MRI  Brain and MRA Brain tomorrow to confirm stroke if any and assess intracranial circulation.  Carotid doppler U/S and TTE with bubble study to assess other risk factors.    Start statin and check fasting lipid panel.    [facility], [place] 03/20/2016 5:57 PM

## 2016-03-20 NOTE — ED Provider Notes (Signed)
MSE was initiated and I personally evaluated the patient and placed orders (if any) at  2:37 PM on March 20, 2016.  The patient appears stable so that the remainder of the MSE may be completed by another provider.  No focal weakness on examination this time.  Patient reported that she stood up and felt lightheaded and developed a tingling sensation of her left upper extremity and left lower extremity. Symptoms began about 2 hours ago.  Reports feeling better at this time.  No indication for initiation of code stroke at this time.   Azalia BilisKevin Ahnya Akre, MD 03/20/16 [phone]

## 2016-03-20 NOTE — Progress Notes (Signed)
CODE STROKE 14:46  Beeper  14:51  Call 14:55  Started exam 14:59 Exam Finished, [facility] exam completed in epic 150[facility] spoke with Kennyth ArnoldStacy

## 2016-03-20 NOTE — ED Notes (Signed)
From CT - Call from Neuro

## 2016-03-21 ENCOUNTER — Inpatient Hospital Stay (HOSPITAL_COMMUNITY): Payer: Medicare Other

## 2016-03-21 DIAGNOSIS — R531 Weakness: Secondary | ICD-10-CM

## 2016-03-21 LAB — HEMOGLOBIN A1C
Hgb A1c MFr Bld: 6.2 % — ABNORMAL HIGH (ref 4.8–5.6)
MEAN PLASMA GLUCOSE: 131 mg/dL

## 2016-03-21 LAB — HIV ANTIBODY (ROUTINE TESTING W REFLEX): HIV Screen 4th Generation wRfx: NONREACTIVE

## 2016-03-21 MED ORDER — ASPIRIN 325 MG PO TABS
325.0000 mg | ORAL_TABLET | Freq: Every day | ORAL | Status: DC
  Administered 2016-03-21 – 2016-03-23 (×3): 325 mg via ORAL
  Filled 2016-03-21 (×3): qty 1

## 2016-03-21 MED ORDER — MONTELUKAST SODIUM 10 MG PO TABS
10.0000 mg | ORAL_TABLET | Freq: Every day | ORAL | Status: DC
  Administered 2016-03-21 – 2016-03-22 (×2): 10 mg via ORAL
  Filled 2016-03-21 (×3): qty 1

## 2016-03-21 MED ORDER — ALBUTEROL SULFATE (2.5 MG/3ML) 0.083% IN NEBU: 2.5000 mg | INHALATION_SOLUTION | Freq: Four times a day (QID) | RESPIRATORY_TRACT | Status: DC | PRN

## 2016-03-21 MED ORDER — MOMETASONE FURO-FORMOTEROL FUM 200-5 MCG/ACT IN AERO
2.0000 | INHALATION_SPRAY | Freq: Two times a day (BID) | RESPIRATORY_TRACT | Status: DC
  Administered 2016-03-21 – 2016-03-23 (×4): 2 via RESPIRATORY_TRACT
  Filled 2016-03-21 (×3): qty 8.8

## 2016-03-21 MED ORDER — ATORVASTATIN CALCIUM 40 MG PO TABS
40.0000 mg | ORAL_TABLET | Freq: Every day | ORAL | Status: DC
  Administered 2016-03-21 – 2016-03-23 (×3): 40 mg via ORAL
  Filled 2016-03-21 (×3): qty 1

## 2016-03-21 MED ORDER — ALBUTEROL SULFATE HFA 108 (90 BASE) MCG/ACT IN AERS: 2.0000 | INHALATION_SPRAY | Freq: Four times a day (QID) | RESPIRATORY_TRACT | Status: DC | PRN

## 2016-03-21 NOTE — Progress Notes (Signed)
PT Cancellation Note  Patient Details Name: Lauren MayShelia D Koch MRN: [id number] DOB: 1950/11/27   Cancelled Treatment:    Reason Eval/Treat Not Completed: Patient not medically ready. Pt received tpa at 16:20 03/20/16. Pt remains on bedrest until 16:20pm TODAY 03/21/16. Acute PT to return as able when appropriate or cleared by MD.   Iona HansenAshly M Prachi Oftedahl 03/21/2016, 6:42 AM   Lewis ShockAshly Carisma Troupe, PT, DPT Pager #: [phone] Office #: [phone]

## 2016-03-21 NOTE — Progress Notes (Signed)
STROKE TEAM PROGRESS NOTE   HISTORY OF PRESENT ILLNESS (per record) Patient was transferred from [facility].  At about 12:30 pm she had sudden onset of left sided numbness and weakness associated with nausea, dizziness, and vomiting.  She apparently changed the history several times delaying her evaluation in the ER, but received IV tPA bolus at 15:51.  Somehow, the IV tubing disconnected and she did not get the whole infusion over the next hour.  EMS estimates about 1/3 may have gone in.   However, en route the EMS called me and told me that her weakness has resolved and she has only mild residual subjective numbness.  CT at OSH showed no bleed, but there was extensive periventricular small vessel ischemic disease.  CBC, CMP, and coags were normal at OSH except for Cr 1.22 and GFR 46.  Her known stroke risk factors are HTN, smoking 1/2 ppd, and high cholesterol.  She believes that she has had glucose > 200 at some point, but no one has officially diagnosed her with DM.   She passed the swallow study at OSH.     SUBJECTIVE (INTERVAL HISTORY) Her daughter is at bedside. Feeling better, no sensory changes anymore, feels still a little weak on the left side and some right sharp shooting pain on the right occipital area. Pending MRI today at 4pm, 24 hours after TPA   OBJECTIVE Temp:  [97.5 F (36.4 C)-98.5 F (36.9 C)] 98.3 F (36.8 C) (02/18 0729) Pulse Rate:  [64-85] 68 (02/18 0700) Cardiac Rhythm: Normal sinus rhythm (02/17 1730) Resp:  [16-20] 20 (02/17 1730) BP: (82-140)/(55-97) 113/65 (02/18 0700) SpO2:  [92 %-100 %] 95 % (02/18 0700) Weight:  [90.7 kg (200 lb)-93.4 kg (205 lb 14.6 oz)] 93.4 kg (205 lb 14.6 oz) (02/17 1730)  CBC:  Recent Labs Lab 03/20/16 1439 03/20/16 1456  WBC 10.1  --   NEUTROABS 7.4  --   HGB 13.4 13.9  HCT 39.8 41.0  MCV 84.0  --   PLT 362  --     Basic Metabolic Panel:  Recent Labs Lab 03/20/16 1439 03/20/16 1456  NA 137 138  K 4.0 4.3  CL  101 103  CO2 26  --   GLUCOSE 121* 121*  BUN 17 19  CREATININE 1.22* 1.30*  CALCIUM 9.7  --     Lipid Panel:    Component Value Date/Time   CHOL 211 (H) 03/20/2016 1817   TRIG 60 03/20/2016 1817   HDL 77 03/20/2016 1817   CHOLHDL 2.7 03/20/2016 1817   VLDL 12 03/20/2016 1817   LDLCALC 122 (H) 03/20/2016 1817   HgbA1c:  Lab Results  Component Value Date   HGBA1C 6.3 (H) 05/30/2015   Urine Drug Screen:    Component Value Date/Time   LABOPIA NONE DETECTED 03/20/2016 2036   COCAINSCRNUR NONE DETECTED 03/20/2016 2036   LABBENZ NONE DETECTED 03/20/2016 2036   AMPHETMU NONE DETECTED 03/20/2016 2036   THCU NONE DETECTED 03/20/2016 2036   LABBARB NONE DETECTED 03/20/2016 2036      IMAGING  Ct Head Code Stroke W/o Cm 03/20/2016 1. No acute finding. ASPECTS is 10.  2. Extensive chronic microvascular disease in the cerebral white matter.     MRI / MRA Head - pending    Physical exam: Exam: Gen: NAD, conversant              CV: RRR, no MRG. No Carotid Bruits. No peripheral edema, warm, nontender Eyes: Conjunctivae clear without exudates  or hemorrhage  Neuro: Detailed Neurologic Exam  Speech:    Speech is normal; fluent and spontaneous with normal comprehension.  Cognition:    The patient is oriented to person, place, and time;   Cranial Nerves:    The pupils are equal, round, and reactive to light. Visual fields are full to finger confrontation. Extraocular movements are intact. Trigeminal sensation is intact and the muscles of mastication are normal. The face is symmetric. The palate elevates in the midline. Hearing intact. Voice is normal. Shoulder shrug is normal. The tongue has normal motion without fasciculations.   Coordination:    Normal finger to nose .   Motor Observation:    No asymmetry, no atrophy, and no involuntary movements noted. Tone:    Normal muscle tone.      Strength:    Mild left-sided weakness      Sensation: intact to LT       ASSESSMENT/PLAN [name] is a 66 y.o. female with history of hypertension, tobacco use, COPD, and asthma presenting with left-sided numbness and weakness associated with nausea vomiting and dizziness.   She received IV t-PA on 03/20/16 at 1551.   Stroke:  Right infarct - specific location and etiology unknown at this time.  Resultant Mild left-sided weakness (which may be chronic)  MRI - pending  MRA - pending  Carotid Doppler - pending  2D Echo - pending  LDL - 122  HgbA1c - 6.2  VTE prophylaxis - SCDs  Diet Heart Room service appropriate? Yes; Fluid consistency: Thin  No antithrombotic prior to admission, now on No antithrombotic post t-PA (nurse to call for ASA order after imaging)  Patient counseled to be compliant with her antithrombotic medications  Ongoing aggressive stroke risk factor management  Therapy recommendations:  pending  Disposition: Pending  Hypertension  Blood pressure mildly low at times.  Permissive hypertension (OK if < 220/120) but gradually normalize in 5-7 days  Long-term BP goal normotensive  Hyperlipidemia  Home meds: No lipid lowering medications prior to admission  LDL 122, goal < 70  Start Lipitor 40 mg daily  Continue statin at discharge    Other Stroke Risk Factors  Advanced age  Cigarette smoker - advised to stop smoking  Obesity, Body mass index is 40.21 kg/m., recommend weight loss, diet and exercise as appropriate    Other Active Problems  Creatinine - 1.30  Hospital day # 1  Personally examined patient and images, and have participated in and made any corrections needed to history, physical, neuro exam,assessment and plan as stated above.  I have personally obtained the history, evaluated lab date, reviewed imaging studies and agree with radiology interpretations. Patient with left-sided weakness, may be chronic, received TPA, pending MRI  Naomie Dean, MD Stroke Neurology [facility]   To contact Stroke Continuity provider, please refer to [email or web address]. After hours, contact General Neurology

## 2016-03-21 NOTE — Plan of Care (Signed)
Problem: Education: Goal: Knowledge of disease or condition will improve Outcome: Completed/Met Date Met: 03/21/16 Patient has stroke book at bedside   

## 2016-03-22 ENCOUNTER — Inpatient Hospital Stay (HOSPITAL_COMMUNITY): Payer: Medicare Other

## 2016-03-22 DIAGNOSIS — I6300 Cerebral infarction due to thrombosis of unspecified precerebral artery: Secondary | ICD-10-CM

## 2016-03-22 MED ORDER — HYDROCHLOROTHIAZIDE 25 MG PO TABS
25.0000 mg | ORAL_TABLET | Freq: Every day | ORAL | Status: DC
  Administered 2016-03-22: 25 mg via ORAL
  Filled 2016-03-22: qty 1

## 2016-03-22 MED ORDER — POTASSIUM CHLORIDE CRYS ER 20 MEQ PO TBCR
20.0000 meq | EXTENDED_RELEASE_TABLET | Freq: Every day | ORAL | Status: DC
  Administered 2016-03-22: 20 meq via ORAL
  Filled 2016-03-22: qty 1

## 2016-03-22 MED ORDER — PANTOPRAZOLE SODIUM 40 MG PO TBEC
40.0000 mg | DELAYED_RELEASE_TABLET | Freq: Every day | ORAL | Status: DC
  Administered 2016-03-22: 40 mg via ORAL
  Filled 2016-03-22: qty 1

## 2016-03-22 MED ORDER — TIOTROPIUM BROMIDE MONOHYDRATE 18 MCG IN CAPS
18.0000 ug | ORAL_CAPSULE | Freq: Every day | RESPIRATORY_TRACT | Status: DC
  Administered 2016-03-23: 18 ug via RESPIRATORY_TRACT
  Filled 2016-03-22: qty 5

## 2016-03-22 MED ORDER — LISINOPRIL 20 MG PO TABS
20.0000 mg | ORAL_TABLET | Freq: Every day | ORAL | Status: DC
Start: 2016-03-22 — End: 2016-03-23
  Administered 2016-03-22: 20 mg via ORAL
  Filled 2016-03-22: qty 1

## 2016-03-22 MED ORDER — AMLODIPINE BESYLATE 5 MG PO TABS
5.0000 mg | ORAL_TABLET | Freq: Every day | ORAL | Status: DC
  Administered 2016-03-22: 5 mg via ORAL
  Filled 2016-03-22: qty 1

## 2016-03-22 NOTE — Progress Notes (Signed)
Occupational THerapy Evaluation Note  Pt presents to OT with the below listed deficits.  She appears to be close to her baseline and requires min guard to supervision with ADLs.  She does demonstrate DOE 3/4 with activity and demonstrates 4/5 strength Lt UE.   Will follow acutely.  No follow up OT recommended at discharge.  Recommend Intermittent supervision, and she will not need any DME   03/22/16 1200  OT Visit Information  Last OT Received On 03/22/16  Assistance Needed +1  History of Present Illness This 66 y.o. female [facility] with sudden onset of Lt sided numbness adn weakness.   She received TPA and was transferred to [facility].   MRI of brain showed:  Scattered small foci of restricted diffusion in the left ACA  Restrictions  Weight Bearing Restrictions No  Home Living  Family/patient expects to be discharged to: Private residence  Living Arrangements Spouse/significant other;Children;Other relatives  Available Help at Discharge Family;Available 24 hours/day  Type of Home Mobile home  Home Layout One level  Bathroom Shower/Tub Walk-in IT trainer - 2 wheels;Shower seat  Additional Comments Lives with husband, daughter, and grandson   Prior Function  Level of Independence Independent  Comments Pt uses 02 as needed.  She reports she cooks and cleans occasionally mopping floor using rolling chair due to fatigue.  She reports she drives   Communication  Communication No difficulties  Pain Assessment  Pain Assessment No/denies pain  Cognition  Arousal/Alertness Awake/alert  Behavior During Therapy WFL for tasks assessed/performed  Overall Cognitive Status Within Functional Limits for tasks assessed  General Comments Pt sitting up in bed playing Angry Birds on her tablet.   No obvious deficits cognitively.  Daughter feels pt is at baseline cognitively  Upper Extremity Assessment  Upper Extremity Assessment LUE deficits/detail   LUE Deficits / Details Pt with strength grossly 4/5 - 4+/5  Lower Extremity Assessment  Lower Extremity Assessment Defer to PT evaluation  Cervical / Trunk Assessment  Cervical / Trunk Assessment Normal  ADL  Overall ADL's  Needs assistance/impaired  Eating/Feeding Independent  Grooming Wash/dry hands;Wash/dry face;Oral care;Brushing hair;Supervision/safety;Standing  Upper Body Bathing Set up;Sitting  Lower Body Bathing Supervison/ safety;Sit to/from stand  Upper Body Dressing  Set up;Sitting  Lower Body Dressing Supervision/safety;Sit to/from Lawyer;Ambulation;Comfort height toilet;Regular Toilet;Grab bars  Toileting- Clothing Manipulation and Hygiene Supervision/safety;Sit to/from stand  Functional mobility during ADLs Supervision/safety  General ADL Comments Pt fatigues wtih activity - DOE 3/4.  02 sats 95% on RA   Vision- History  Baseline Vision/History Wears glasses  Wears Glasses At all times  Patient Visual Report No change from baseline  Vision- Assessment  Vision Assessment? Yes  Eye Alignment WFL  Ocular Range of Motion [facility]  Alignment/Gaze Preference WDL  Tracking/Visual Pursuits Able to track stimulus in all quads without difficulty  Visual Fields No apparent deficits  Additional Comments Pt playing video games and reading on her tablet with no difficulties   Perception  Perception Tested? Yes  Praxis  Praxis tested? WFL  Bed Mobility  Overal bed mobility Modified Independent  Transfers  Overall transfer level Needs assistance  Equipment used None  Transfers Sit to/from Stand;Stand Pivot Transfers  Sit to Stand Supervision  Stand pivot transfers Supervision  Balance  Overall balance assessment Needs assistance  Sitting-balance support Feet supported  Sitting balance-Leahy Scale Good  Standing balance support During functional activity;No upper extremity supported  Standing balance-Leahy  Scale Good  OT - End of Session   Activity Tolerance Patient tolerated treatment well  Patient left in chair;with call bell/phone within reach;with family/visitor present  Nurse Communication Mobility status  OT Assessment  OT Recommendation/Assessment Patient needs continued OT Services  OT Visit Diagnosis Muscle weakness (generalized) (M62.81)  OT Problem List Decreased strength;Decreased activity tolerance;Decreased knowledge of use of DME or AE  OT Plan  OT Frequency (ACUTE ONLY) Min 2X/week  OT Treatment/Interventions (ACUTE ONLY) Self-care/ADL training;Therapeutic exercise;DME and/or AE instruction;Therapeutic activities;Patient/family education;Balance training  AM-PAC OT "6 Clicks" Daily Activity Outcome Measure  Help from another person eating meals? 4  Help from another person taking care of personal grooming? 3  Help from another person toileting, which includes using toliet, bedpan, or urinal? 3  Help from another person bathing (including washing, rinsing, drying)? 3  Help from another person to put on and taking off regular upper body clothing? 4  Help from another person to put on and taking off regular lower body clothing? 3  6 Click Score 20  ADL G Code Conversion CJ  OT Recommendation  Follow Up Recommendations No OT follow up;Supervision - Intermittent  OT Equipment None recommended by OT  Individuals Consulted  Consulted and Agree with Results and Recommendations Patient  Acute Rehab OT Goals  Patient Stated Goal to go home   OT Goal Formulation With patient/family  Time For Goal Achievement 03/29/16  Potential to Achieve Goals Good  OT Time Calculation  OT Start Time (ACUTE ONLY) 1141  OT Stop Time (ACUTE ONLY) 1205  OT Time Calculation (min) 24 min  OT General Charges  $OT Visit 1 Procedure  OT Evaluation  $OT Eval Moderate Complexity 1 Procedure  OT Treatments  $Self Care/Home Management  8-22 mins  Written Expression  Dominant Hand Right  Jeani HawkingWendi Mayola Mcbain, OTR/L [phone]

## 2016-03-22 NOTE — Evaluation (Signed)
Speech Language Pathology Evaluation Patient Details Name: Lauren MayShelia D Porcher MRN: [id number] DOB: 1950/04/28 Today's Date: 03/22/2016 Time: [id number] SLP Time Calculation (min) (ACUTE ONLY): 23 min  Problem List:  Patient Active Problem List   Diagnosis Date Noted  . Stroke (HCC) 03/20/2016  . Osteopenia 08/08/2015  . Pulmonary emphysema (HCC)   . Hyperglycemia 05/30/2015  . Abnormal TSH 05/30/2015  . CAP (community acquired pneumonia) 05/29/2015  . Acute respiratory failure with hypoxia (HCC) 05/29/2015  . COPD exacerbation (HCC) 05/29/2015  . Acute kidney injury (HCC) 05/29/2015  . Obesity 05/29/2015  . Tobacco abuse 05/29/2015  . Right leg pain 05/29/2015  . Essential hypertension 05/29/2015  . Vitamin D deficiency 04/17/2015  . Smoker 03/03/2015  . Asthma   . Cataract   . COPD (chronic obstructive pulmonary disease) (HCC)   . Hypertension   . GERD (gastroesophageal reflux disease)    Past Medical History:  Past Medical History:  Diagnosis Date  . Asthma   . Cataract   . COPD (chronic obstructive pulmonary disease) (HCC)   . GERD (gastroesophageal reflux disease)   . Hypertension   . Smoker    Past Surgical History:  Past Surgical History:  Procedure Laterality Date  . CESAREAN SECTION  1971/1976  . HERNIA REPAIR  2013  . PARTIAL HYMENECTOMY     HPI:  66 y.o.femalewith history of hypertension, tobacco use, COPD, and asthma presenting with left-sided numbness and weakness associated with nausea vomiting and dizziness. Shereceived IV t-PA. MRI showed scattered small foci of restricted diffusion in the left ACA (possibly also right ACA), left MCA, left PCA, and probably also the basilar perforator artery (in the right dorsal pons) territories.   Assessment / Plan / Recommendation Clinical Impression  Pt scored a 25/30 on the MoCA, but she believes that she is at her baseline level of function. She lives at home with several family members and will have 24/7  supervision upon return home. No acute SLP needs identified, will sign off.    SLP Assessment  SLP Recommendation/Assessment: Patient does not need any further Speech Lanaguage Pathology Services SLP Visit Diagnosis: Cognitive communication deficit (R41.841)    Follow Up Recommendations  Other (comment);None (supervision upon initial return home)    Frequency and Duration           SLP Evaluation Cognition  Overall Cognitive Status: Within Functional Limits for tasks assessed Orientation Level: Oriented X4       Comprehension  Auditory Comprehension Overall Auditory Comprehension: Appears within functional limits for tasks assessed    Expression Expression Primary Mode of Expression: Verbal Verbal Expression Overall Verbal Expression: Appears within functional limits for tasks assessed Written Expression Dominant Hand: Right   Oral / Motor  Motor Speech Overall Motor Speech: Appears within functional limits for tasks assessed   GO                    Maxcine Hamaiewonsky, Mykia Holton 03/22/2016, 3:53 PM  Maxcine HamLaura Paiewonsky, M.A. CCC-SLP [phone]

## 2016-03-22 NOTE — Evaluation (Signed)
Physical Therapy Evaluation Patient Details Name: Lauren Koch MRN: [id number] DOB: December 02, 1950 Today's Date: 03/22/2016   History of Present Illness  This 66 y.o. female [facility] with sudden onset of Lt sided numbness and weakness.   She received TPA and was transferred to [facility].   MRI of brain showed:  Scattered small foci of restricted diffusion in the left ACA, MCA, and PCA and possible chronic R MCA and R caudate lacunar infarct. NIHSS 1.    Clinical Impression  Pt presents to PT with L-sided weakness, decreased endurance, and dyspnea on exertion secondary to above. Pt able to amb in hallway, reaching 3/4 for DOE which resolved with rest; pt and daughter report that this is near pt's baseline function. SpO2 remained >92% with activity. PTA, pt lives at home with family who can provide 24/7 supervision. Pt would benefit from continued acute PT services to maximize functional mobility and independence. May benefit from RW for energy conservation.    Follow Up Recommendations No PT follow up    Equipment Recommendations  None recommended by PT    Recommendations for Other Services       Precautions / Restrictions Restrictions Weight Bearing Restrictions: No      Mobility  Bed Mobility Overal bed mobility: Modified Independent             General bed mobility comments: Sit to supine with increased time and bed rail.   Transfers Overall transfer level: Needs assistance Equipment used: None Transfers: Sit to/from Stand Sit to Stand: Min guard Stand pivot transfers: Supervision       General transfer comment: Stood with min guard for first time up.   Ambulation/Gait Ambulation/Gait assistance: Min guard Ambulation Distance (Feet): 100 Feet Assistive device: None Gait Pattern/deviations: Step-to pattern;Decreased step length - right;Decreased step length - left Gait velocity: Decreased velocity.  Gait velocity interpretation: Below normal speed for  age/gender General Gait Details: Amb 100' with min guard for safety and 2x standing rest break. SpO2 remained >92% with ambulation. Pt reported 3/4 DOE, which subsided with rest.   Stairs            Wheelchair Mobility    Modified Rankin (Stroke Patients Only)       Balance Overall balance assessment: Needs assistance Sitting-balance support: Feet supported Sitting balance-Leahy Scale: Good     Standing balance support: During functional activity;No upper extremity supported Standing balance-Leahy Scale: Good                               Pertinent Vitals/Pain Pain Assessment: No/denies pain    Home Living Family/patient expects to be discharged to:: Private residence Living Arrangements: Spouse/significant other;Children;Other relatives Available Help at Discharge: Family;Available 24 hours/day Type of Home: Mobile home       Home Layout: One level Home Equipment: Walker - 2 wheels;Shower seat Additional Comments: Lives with husband, daughter, and grandson     Prior Function Level of Independence: Independent         Comments: Pt reports using O2 PRN. She is household ambulator, and uses scooter for majority of prolonged community amb; daughter reports she is able to walk ~5 min before needing seated rest break secondary to dyspnea. Pt drives.     Hand Dominance   Dominant Hand: Right    Extremity/Trunk Assessment   Upper Extremity Assessment Upper Extremity Assessment: Defer to OT evaluation LUE Deficits / Details: Pt with strength  grossly 4/5 - 4+/5    Lower Extremity Assessment Lower Extremity Assessment: Generalized weakness;LLE deficits/detail LLE Deficits / Details: Grossly 4/5 strength LLE    Cervical / Trunk Assessment Cervical / Trunk Assessment: Normal  Communication   Communication: No difficulties  Cognition Arousal/Alertness: Awake/alert Behavior During Therapy: WFL for tasks assessed/performed Overall Cognitive  Status: Within Functional Limits for tasks assessed                 General Comments: Pt sitting up in bed playing Angry Birds on her tablet.   No obvious deficits cognitively.  Daughter feels pt is at baseline cognitively    General Comments      Exercises     Assessment/Plan    PT Assessment Patient needs continued PT services  PT Problem List Decreased strength;Decreased activity tolerance;Decreased balance;Decreased mobility;Cardiopulmonary status limiting activity       PT Treatment Interventions Gait training;Therapeutic exercise;Therapeutic activities;Patient/family education;Functional mobility training;Balance training    PT Goals (Current goals can be found in the Care Plan section)  Acute Rehab PT Goals Patient Stated Goal: Return home PT Goal Formulation: With patient Time For Goal Achievement: 04/05/16 Potential to Achieve Goals: Good    Frequency Min 3X/week   Barriers to discharge        Co-evaluation               End of Session Equipment Utilized During Treatment: Gait belt Activity Tolerance: Patient tolerated treatment well;Other (comment) (Limted by dyspnea with exertion) Patient left: in bed;with call bell/phone within reach;with family/visitor present Nurse Communication: Mobility status PT Visit Diagnosis: Muscle weakness (generalized) (M62.81);Other (comment) (Impaired endurance)         Time: [id number] PT Time Calculation (min) (ACUTE ONLY): 20 min   Charges:   PT Evaluation $PT Eval Low Complexity: 1 Procedure     PT G Codes:       Dewayne HatchJaclyn Leigh Glennette Galster, SPT [phone]  Ina HomesJaclyn Natina Wiginton 03/22/2016, 3:21 PM

## 2016-03-22 NOTE — Progress Notes (Signed)
STROKE TEAM PROGRESS NOTE   SUBJECTIVE (INTERVAL HISTORY) No complaints. Willing to do what she needs to to get better. Willing to stop smoking.    OBJECTIVE Temp:  [97.9 F (36.6 C)-98.6 F (37 C)] 98.1 F (36.7 C) (02/19 0832) Pulse Rate:  [67-84] 73 (02/19 0800) Cardiac Rhythm: Normal sinus rhythm (02/19 0800) BP: (94-132)/(55-87) 116/63 (02/19 0800) SpO2:  [91 %-98 %] 94 % (02/19 0800)  CBC:   Recent Labs Lab 03/20/16 1439 03/20/16 1456  WBC 10.1  --   NEUTROABS 7.4  --   HGB 13.4 13.9  HCT 39.8 41.0  MCV 84.0  --   PLT 362  --     Basic Metabolic Panel:   Recent Labs Lab 03/20/16 1439 03/20/16 1456  NA 137 138  K 4.0 4.3  CL 101 103  CO2 26  --   GLUCOSE 121* 121*  BUN 17 19  CREATININE 1.22* 1.30*  CALCIUM 9.7  --      Physical exam: Exam:  .Pleasant elderly caucasian lady Afebrile. Head is nontraumatic. Neck is supple without bruit.    Cardiac exam no murmur or gallop. Lungs are clear to auscultation. Distal pulses are well felt.  Neuro: Detailed Neurologic Exam  Speech:    Speech is normal; fluent and spontaneous with normal comprehension.  Cognition:    The patient is oriented to person, place, and time;   Cranial Nerves:    The pupils are equal, round, and reactive to light. Visual fields are full to finger confrontation. Extraocular movements are intact. Trigeminal sensation is intact and the muscles of mastication are normal. The face is symmetric. The palate elevates in the midline. Hearing intact. Voice is normal. Shoulder shrug is normal. The tongue has normal motion without fasciculations.   Coordination:    Normal finger to nose .   Motor Observation:    No asymmetry, no atrophy, and no involuntary movements noted.diminished fine finger movements on left Tone:    Normal muscle tone.     Strength:    Mild left-sided weakness      Sensation: intact to LT     ASSESSMENT/PLAN Lauren Koch is a 66 y.o. female with history  of hypertension, tobacco use, COPD, and asthma presenting with left-sided numbness and weakness associated with nausea vomiting and dizziness.  She received IV t-PA on 03/20/16 at 1551.   Stroke:  Scattered L anterior circulation infarcts as well as a R pontine infarct. Infarcts felt to be embolic, source unknown.   Resultant Mild left-sided weakness (which may be chronic)  Code stroke CT no acute finding. ASPECTS 10  MRI - scattered L ACA, L MCA, L PCA and R dorsal pontine territory infarcts. Old R caudate lacune  MRA - R MCA M1 occlusion. L PCA P1 occlusion.  Carotid Doppler - Bilat 1-39%, No significant stenosis   2D Echo - pending  TEE to look for embolic source. Arranged with [facility] for tomorrow.  If positive for PFO (patent foramen ovale), check bilateral lower extremity venous dopplers to rule out DVT as possible source of stroke. (I have made patient NPO after midnight tonight)..  If TEE negative, a [facility] electrophysiologist will consult and consider placement of an implantable loop recorder to evaluate for atrial fibrillation as etiology of stroke. This has been explained to patient/family by Dr. Pearlean Brownie and they are agreeable.   LDL - 122  HgbA1c - 6.2  VTE prophylaxis - SCDs Diet  Heart Room service appropriate? Yes; Fluid consistency: Thin  No antithrombotic prior to admission, now on aspirin 325 mg daily   Patient counseled to be compliant with her antithrombotic medications  Ongoing aggressive stroke risk factor management  Therapy recommendations:  Pending. Ok to be OOB.  Disposition: Pending  Transfer to the floor  Hypertension  Blood pressure mildly low at times.  Permissive hypertension (OK if < 220/120) but gradually normalize in 5-7 days  Long-term BP goal normotensive  Hyperlipidemia  Home meds: No lipid lowering medications prior to admission  LDL 122, goal < 70  Now on Lipitor 40 mg  daily  Continue statin at discharge  Other Stroke Risk Factors  Advanced age  Cigarette smoker - advised to stop smoking  Morbid Obesity, Body mass index is 40.21 kg/m., recommend weight loss, diet and exercise as appropriate   Other Active Problems  Creatinine - 1.30  Hospital day # 2  Rhoderick MoodyBIBY,SHARON  [facility] See Amion for Pager information 03/22/2016 2:35 PM  I have personally examined this patient, reviewed notes, independently viewed imaging studies, participated in medical decision making and plan of care.ROS completed by me personally and pertinent positives fully documented  I have made any additions or clarifications directly to the above note. Agree with note above. Plan transfer to neurology floor bed today. Mobilize out of bed. Greater than 50% time during this 35 minute visit was spent on counseling and coordination of care about stroke risk, prevention and discussion of treatment   Delia HeadyPramod Carlito Bogert, MD Medical Director [facility] Pager: [phone] 03/22/2016 4:51 PM  To contact Stroke Continuity provider, please refer to [email or web address]. After hours, contact General Neurology

## 2016-03-22 NOTE — Progress Notes (Signed)
*  PRELIMINARY RESULTS* Vascular Ultrasound Carotid Duplex (Doppler) has been completed.  Preliminary findings: Bilateral 1-39% ICA stenosis, antegrade vertebral flow.   Lauren DonningCharlotte C Alison Koch 03/22/2016, 11:00 AM

## 2016-03-23 ENCOUNTER — Encounter (HOSPITAL_COMMUNITY): Payer: Self-pay

## 2016-03-23 ENCOUNTER — Encounter (HOSPITAL_COMMUNITY)
Admission: EM | Disposition: A | Discharge status: Home / Self Care | Payer: Self-pay | Source: Home / Self Care | Attending: Neurology

## 2016-03-23 ENCOUNTER — Inpatient Hospital Stay (HOSPITAL_COMMUNITY)
Admit: 2016-03-23 | Discharge: 2016-03-23 | Disposition: A | Discharge status: Home / Self Care | Payer: Medicare Other | Attending: Cardiovascular Disease | Admitting: Cardiovascular Disease

## 2016-03-23 ENCOUNTER — Inpatient Hospital Stay (HOSPITAL_COMMUNITY): Payer: Medicare Other

## 2016-03-23 DIAGNOSIS — I639 Cerebral infarction, unspecified: Secondary | ICD-10-CM

## 2016-03-23 DIAGNOSIS — I6789 Other cerebrovascular disease: Secondary | ICD-10-CM

## 2016-03-23 HISTORY — PX: TEE WITHOUT CARDIOVERSION: SHX5443

## 2016-03-23 HISTORY — PX: LOOP RECORDER INSERTION: EP1214

## 2016-03-23 LAB — ECHOCARDIOGRAM COMPLETE
E decel time: 398 msec
EERAT: 6.95
FS: 48 % — AB (ref 28–44)
HEIGHTINCHES: 60 in
IVS/LV PW RATIO, ED: 0.9
LA diam index: 1.42 cm/m2
LA vol A4C: 31.7 ml
LA vol index: 20.9 mL/m2
LASIZE: 29 mm
LAVOL: 42.7 mL
LDCA: 2.54 cm2
LEFT ATRIUM END SYS DIAM: 29 mm
LV E/e' medial: 6.95
LV E/e'average: 6.95
LV e' LATERAL: 8.44 cm/s
LVOTD: 18 mm
MV Dec: 398
MVPKAVEL: 90.7 m/s
MVPKEVEL: 58.7 m/s
PW: 11.8 mm — AB (ref 0.6–1.1)
RV LATERAL S' VELOCITY: 15.3 cm/s
TDI e' lateral: 8.44
TDI e' medial: 7.6
WEIGHTICAEL: 3294.55 [oz_av]

## 2016-03-23 SURGERY — LOOP RECORDER INSERTION

## 2016-03-23 SURGERY — ECHOCARDIOGRAM, TRANSESOPHAGEAL
Anesthesia: Moderate Sedation

## 2016-03-23 MED ORDER — FENTANYL CITRATE (PF) 100 MCG/2ML IJ SOLN
INTRAMUSCULAR | Status: AC
Start: 2016-03-23 — End: 2016-03-23
  Filled 2016-03-23: qty 2

## 2016-03-23 MED ORDER — SODIUM CHLORIDE 0.9 % IV SOLN
INTRAVENOUS | Status: DC
  Administered 2016-03-23: 500 mL via INTRAVENOUS

## 2016-03-23 MED ORDER — ASPIRIN 325 MG PO TABS: 325.0000 mg | ORAL_TABLET | Freq: Every day | ORAL | 2 refills | Status: DC

## 2016-03-23 MED ORDER — MIDAZOLAM HCL 10 MG/2ML IJ SOLN
INTRAMUSCULAR | Status: DC | PRN
  Administered 2016-03-23 (×3): 1 mg via INTRAVENOUS

## 2016-03-23 MED ORDER — FENTANYL CITRATE (PF) 100 MCG/2ML IJ SOLN
INTRAMUSCULAR | Status: DC | PRN
  Administered 2016-03-23: 12.5 ug via INTRAVENOUS
  Administered 2016-03-23: 25 ug via INTRAVENOUS

## 2016-03-23 MED ORDER — BUPIVACAINE HCL (PF) 0.25 % IJ SOLN
INTRAMUSCULAR | Status: AC
  Filled 2016-03-23: qty 30

## 2016-03-23 MED ORDER — ACETAMINOPHEN 325 MG PO TABS: 325.0000 mg | ORAL_TABLET | ORAL | Status: DC | PRN

## 2016-03-23 MED ORDER — ATORVASTATIN CALCIUM 40 MG PO TABS: 40.0000 mg | ORAL_TABLET | Freq: Every day | ORAL | 2 refills | Status: DC

## 2016-03-23 MED ORDER — OMEPRAZOLE 20 MG PO CPDR: 20.0000 mg | DELAYED_RELEASE_CAPSULE | Freq: Every day | ORAL | 2 refills | Status: DC

## 2016-03-23 MED ORDER — SODIUM CHLORIDE 0.9 % IV SOLN: INTRAVENOUS | Status: DC

## 2016-03-23 MED ORDER — LISINOPRIL 20 MG PO TABS: ORAL_TABLET | ORAL | 5 refills | Status: DC

## 2016-03-23 MED ORDER — ONDANSETRON HCL 4 MG/2ML IJ SOLN: 4.0000 mg | Freq: Four times a day (QID) | INTRAMUSCULAR | Status: DC | PRN

## 2016-03-23 MED ORDER — BUTAMBEN-TETRACAINE-BENZOCAINE 2-2-14 % EX AERO
INHALATION_SPRAY | CUTANEOUS | Status: DC | PRN
  Administered 2016-03-23: 2 via TOPICAL

## 2016-03-23 MED ORDER — MIDAZOLAM HCL 5 MG/ML IJ SOLN
INTRAMUSCULAR | Status: AC
  Filled 2016-03-23: qty 2

## 2016-03-23 SURGICAL SUPPLY — 2 items
LOOP REVEAL LINQSYS (Prosthesis & Implant Heart) ×3 IMPLANT
PACK LOOP INSERTION (CUSTOM PROCEDURE TRAY) ×3 IMPLANT

## 2016-03-23 NOTE — Progress Notes (Signed)
  Echocardiogram 2D Echocardiogram has been performed.  Lauren Koch 03/23/2016, 10:21 AM

## 2016-03-23 NOTE — Discharge Instructions (Signed)
Implantable Loop Recorder Placement, Care After Introduction Refer to this sheet in the next few weeks. These instructions provide you with information about caring for yourself after your procedure. Your health care provider may also give you more specific instructions. Your treatment has been planned according to current medical practices, but problems sometimes occur. Call your health care provider if you have any problems or questions after your procedure. What can I expect after the procedure? After the procedure, it is common to have:  Soreness or pain near the cut from surgery (incision).  Some swelling or bruising near the incision. Follow these instructions at home: Medicines  Take over-the-counter and prescription medicines only as told by your health care provider.  If you were prescribed an antibiotic medicine, take it as told by your health care provider. Do not stop taking the antibiotic even if you start to feel better. Bathing  Do not take baths, swim, or use a hot tub until your health care provider approves. Ask your health care provider if you can take showers. You may only be allowed to take sponge baths for bathing. Incision care  Follow instructions from your health care provider about how to take care of your incision. Make sure you:  Wash your hands with soap and water before you change your bandage (dressing). If soap and water are not available, use hand sanitizer.  Change your dressing as told by your health care provider.  Keep your dressing dry.  Leave stitches (sutures), skin glue, or adhesive strips in place. These skin closures may need to stay in place for 2 weeks or longer. If adhesive strip edges start to loosen and curl up, you may trim the loose edges. Do not remove adhesive strips completely unless your health care provider tells you to do that.  Check your incision area every day for signs of infection. Check for:  More redness, swelling, or  pain.  Fluid or blood.  Warmth.  Pus or a bad smell. Driving  If you received a sedative, do not drive for 24 hours after the procedure.  If you did not receive a sedative, ask your health care provider when it is safe to drive. Activity  Return to your normal activities as told by your health care provider. Ask your health care provider what activities are safe for you.  Until your health care provider says it is safe:  Do not lift anything that is heavier than 10 lb (4.5 kg).  Do not do activities that involve lifting your arms over your head. General instructions  Follow instructions from your health care provider about how and when to use your implantable loop recorder.  Do not go through a metal detection gate, and do not let someone hold a metal detector over your chest. Show your ID card.  Do not have an MRI unless you check with your health care provider first.  Do not use any tobacco products, such as cigarettes, chewing tobacco, and e-cigarettes. Tobacco can delay healing. If you need help quitting, ask your health care provider.  Keep all follow-up visits as told by your health care provider. This is important. Contact a health care provider if:  You have more redness, swelling, or pain around your incision.  You have more fluid or blood coming from your incision.  Your incision feels warm to the touch.  You have pus or a bad smell coming from your incision.  You have a fever.  You have pain that is not  relieved by your pain medicine.  You have triggered your device because of fainting (syncope) or because of a heartbeat that feels like it is racing, slow, fluttering, or skipping (palpitations). Get help right away if:  You have chest pain.  You have difficulty breathing. This information is not intended to replace advice given to you by your health care provider. Make sure you discuss any questions you have with your health care provider. Document  Released: 12/30/2014 Document Revised: 06/26/2015 Document Reviewed: 10/23/2014  2017 Elsevier Keep incision clean and dry for 3 days. You can remove outer dressing tomorrow. Leave steri-strips (little pieces of tape) on until seen in the office for wound check appointment. Call the office [phone]) for redness, drainage, swelling, or fever.

## 2016-03-23 NOTE — Progress Notes (Signed)
STROKE TEAM PROGRESS NOTE   SUBJECTIVE (INTERVAL HISTORY) No complaints. Willing to do what she needs to to get better. Willing to stop smoking.    OBJECTIVE Temp:  [97.8 F (36.6 C)-98.5 F (36.9 C)] 98.1 F (36.7 C) (02/20 1440) Pulse Rate:  [66-84] 68 (02/20 1440) Cardiac Rhythm: Normal sinus rhythm (02/20 0800) Resp:  [16-20] 16 (02/20 1440) BP: (97-147)/(52-87) 101/60 (02/20 1440) SpO2:  [94 %-97 %] 97 % (02/20 1440)  CBC:   Recent Labs Lab 03/20/16 1439 03/20/16 1456  WBC 10.1  --   NEUTROABS 7.4  --   HGB 13.4 13.9  HCT 39.8 41.0  MCV 84.0  --   PLT 362  --     Basic Metabolic Panel:   Recent Labs Lab 03/20/16 1439 03/20/16 1456  NA 137 138  K 4.0 4.3  CL 101 103  CO2 26  --   GLUCOSE 121* 121*  BUN 17 19  CREATININE 1.22* 1.30*  CALCIUM 9.7  --      Physical exam: Exam:  .Pleasant elderly caucasian lady Afebrile. Head is nontraumatic. Neck is supple without bruit.    Cardiac exam no murmur or gallop. Lungs are clear to auscultation. Distal pulses are well felt.  Neuro: Detailed Neurologic Exam  Speech:    Speech is normal; fluent and spontaneous with normal comprehension.  Cognition:    The patient is oriented to person, place, and time;   Cranial Nerves:    The pupils are equal, round, and reactive to light. Visual fields are full to finger confrontation. Extraocular movements are intact. Trigeminal sensation is intact and the muscles of mastication are normal. The face is symmetric. The palate elevates in the midline. Hearing intact. Voice is normal. Shoulder shrug is normal. The tongue has normal motion without fasciculations.   Coordination:    Normal finger to nose .   Motor Observation:    No asymmetry, no atrophy, and no involuntary movements noted.diminished fine finger movements on left Tone:    Normal muscle tone.     Strength:    Mild left-sided weakness      Sensation: intact to LT     ASSESSMENT/PLAN Lauren Koch  is a 66 y.o. female with history of hypertension, tobacco use, COPD, and asthma presenting with left-sided numbness and weakness associated with nausea vomiting and dizziness.  She received IV t-PA on 03/20/16 at 1551.   Stroke:  Scattered L anterior circulation infarcts as well as a R pontine infarct. Infarcts felt to be embolic, source unknown.   Resultant Mild left-sided weakness (which may be chronic)  Code stroke CT no acute finding. ASPECTS 10  MRI - scattered L ACA, L MCA, L PCA and R dorsal pontine territory infarcts. Old R caudate lacune  MRA - R MCA M1 occlusion. L PCA P1 occlusion.  Carotid Doppler - Bilat 1-39%, No significant stenosis  2D Echo - Left ventricle: The cavity size was normal. Wall thickness was   normal. Systolic function was normal. The estimated ejection   fraction was in the range of 60% to 65%. Although no diagnostic   regional wall motion abnormality was identified, this possibility    cannot be completely excluded on the basis of this study  TEE to look for embolic source. Arranged with [facility] for tomorrow.  If positive for PFO (patent foramen ovale), check bilateral lower extremity venous dopplers to rule out DVT as possible source of stroke. (I have made patient NPO  after midnight tonight)..  If TEE negative, a [facility] electrophysiologist will consult and consider placement of an implantable loop recorder to evaluate for atrial fibrillation as etiology of stroke. This has been explained to patient/family by Dr. Pearlean Brownie and they are agreeable.   LDL - 122  HgbA1c - 6.2  VTE prophylaxis - SCDs Diet NPO time specified  No antithrombotic prior to admission, now on aspirin 325 mg daily   Patient counseled to be compliant with her antithrombotic medications  Ongoing aggressive stroke risk factor management  Therapy recommendations:  None  Disposition:home  Hypertension  Blood pressure mildly  low at times.  Permissive hypertension (OK if < 220/120) but gradually normalize in 5-7 days  Long-term BP goal normotensive  Hyperlipidemia  Home meds: No lipid lowering medications prior to admission  LDL 122, goal < 70  Now on Lipitor 40 mg daily  Continue statin at discharge  Other Stroke Risk Factors  Advanced age  Cigarette smoker - advised to stop smoking  Morbid Obesity, Body mass index is 40.21 kg/m., recommend weight loss, diet and exercise as appropriate   Other Active Problems  Creatinine - 1.30  Hospital day # 3    I have personally examined this patient, reviewed notes, independently viewed imaging studies, participated in medical decision making and plan of care.ROS completed by me personally and pertinent positives fully documented  I have made any additions or clarifications directly to the above note. Agree with note above. Plan TEE and loop today and likely DC home after tests. D/w patient and multiple family members and answered questions.  Delia Heady, MD Medical Director [facility] Pager: [phone] 03/23/2016 3:13 PM  To contact Stroke Continuity provider, please refer to [email or web address]. After hours, contact General Neurology

## 2016-03-23 NOTE — H&P (View-Only) (Signed)
ELECTROPHYSIOLOGY CONSULT NOTE  Patient ID: Lauren Koch MRN: [id number], DOB/AGE: Feb 11, 1950   Admit date: 03/20/2016 Date of Consult: 03/23/2016  Primary Physician: Frazier Richards, PA-C Primary Cardiologist: none Reason for Consultation: Cryptogenic stroke ; recommendations regarding Implantable Loop Recorder  History of Present Illness Lauren Koch was admitted on 03/20/2016 with PMHx of COPD w/ongoing smoking, HTN, asthma admitted to [facility] 03/21/15 with CVA.  She first developed symptoms while at home, onset and symptoms were unclear with c/o L sided weakness, dizziness, she received t-PA.  Imaging demonstrated scattered L ACA, L MCA, L PCA and R dorsal pontine territory infarcts.  she has undergone workup for stroke including carotid dopplers (prelim 1-39% b/l disease), her echocardiogram is pending.  The patient has been monitored on telemetry which has demonstrated sinus rhythm with no arrhythmias.  Inpatient stroke work-up is to be completed with a TEE.   Echocardiogram this admission  Completed this morning, pending result TEE scheduled for this afternoon  Lab work is reviewed.  Prior to admission, the patient denies chest pain, shortness of breath, dizziness, palpitations, or syncope.  She reports full recovery from her stroke with disposition plans to home at discharge.  EP has been asked to evaluate for placement of an implantable loop recorder to monitor for atrial fibrillation.     Past Medical History:  Diagnosis Date  . Asthma   . Cataract   . COPD (chronic obstructive pulmonary disease) (HCC)   . GERD (gastroesophageal reflux disease)   . Hypertension   . Smoker      Surgical History:  Past Surgical History:  Procedure Laterality Date  . CESAREAN SECTION  1971/1976  . HERNIA REPAIR  2013  . PARTIAL HYMENECTOMY       Prescriptions Prior to Admission  Medication Sig Dispense Refill Last Dose  . albuterol (PROVENTIL HFA;VENTOLIN HFA) 108 (90 Base)  MCG/ACT inhaler [name] 1 Inhaler 2 Past Month at Unknown time  . alendronate (FOSAMAX) 70 MG tablet Take 1 tablet (70 mg total) by mouth every 7 (seven) days. Take with a full glass of water on an empty stomach. 4 tablet 3 03/16/2016 at am  . amLODipine (NORVASC) 5 MG tablet TAKE ONE TABLET BY MOUTH ONCE DAILY 30 tablet 5 03/20/2016 at am  . diphenhydrAMINE (BENADRYL) 25 MG tablet Take 25 mg by mouth every 6 (six) hours as needed for sleep.   PRN at PRN  . Fluticasone-Salmeterol (ADVAIR DISKUS) 250-50 MCG/DOSE AEPB Inhale 1 puff into the lungs 2 (two) times daily. 1 each 3 03/19/2016 at pm  . hydrochlorothiazide (HYDRODIURIL) 25 MG tablet TAKE ONE TABLET BY MOUTH ONCE DAILY 30 tablet 5 03/20/2016 at am  . lisinopril (PRINIVIL,[name] TAKE ONE TABLET BY MOUTH AT BEDTIME (Patient taking differently: [name]) 30 tablet 5 03/20/2016 at am  . omeprazole (PRILOSEC) 20 MG capsule Take 1 capsule (20 mg total) by mouth daily. Patient need office visit before anymore refills (Patient taking differently: Take 20 mg by mouth daily before breakfast. ) 30 capsule 0 03/20/2016 at am  . potassium chloride SA (K-DUR,KLOR-CON) 20 MEQ tablet Take 1 tablet (20 mEq total) by mouth daily. 7 tablet 0 03/20/2016 at am  . tiotropium (SPIRIVA HANDIHALER) 18 MCG inhalation capsule Place 1 capsule (18 mcg total) into inhaler and inhale daily. 30 capsule 5 03/20/2016 at am  .  Vitamin D, Ergocalciferol, (DRISDOL) 50000 units CAPS capsule Take 1 capsule (50,000 Units total) by mouth every 7 (seven) days. 12 capsule 0 03/17/2016 at am  . buPROPion (WELLBUTRIN SR) 150 MG 12 hr tablet Take one daily for 5 days then take 1 twice a day (Patient not taking: Reported on 03/20/2016) 60 tablet 4 Not Taking at Unknown time  . cyclobenzaprine (FLEXERIL) 10 MG tablet Take 1 tablet (10 mg total) by mouth 3 (three) times daily as  needed for muscle spasms. (Patient not taking: Reported on 03/20/2016) 30 tablet 1 Not Taking at Unknown time    Inpatient Medications:  . amLODipine  5 mg Oral Daily  . aspirin  325 mg Oral Daily  . atorvastatin  40 mg Oral q1800  . hydrochlorothiazide  25 mg Oral Daily  . lisinopril  20 mg Oral Daily  . mometasone-formoterol  2 puff Inhalation BID  . montelukast  10 mg Oral QHS  . pantoprazole  40 mg Oral QHS  . potassium chloride SA  20 mEq Oral Daily  . tiotropium  18 mcg Inhalation Daily    Allergies:  Allergies  Allergen Reactions  . Prednisone Rash and Other (See Comments)    Arrhythmia, also (Has tolerated hydrocortisone and decadron)     Social History   Social History  . Marital status: Married    Spouse name: N/A  . Number of children: N/A  . Years of education: N/A   Occupational History  . Not on file.   Social History Main Topics  . Smoking status: Current Some Day Smoker    Packs/day: 0.50    Years: 30.00    Types: Cigarettes  . Smokeless tobacco: Former Neurosurgeon    Quit date: 05/29/2015  . Alcohol use No  . Drug use: No  . Sexual activity: Not Currently    Birth control/ protection: None   Other Topics Concern  . Not on file   Social History Narrative  . No narrative on file     Family History  Problem Relation Age of Onset  . Diabetes Mother   . Hypertension Mother   . Miscarriages / [place] Mother   . Heart disease Father   . Diabetes Father   . Hypertension Father   . Cancer Sister     skin  . Diabetes Other       Review of Systems: All other systems reviewed and are otherwise negative except as noted above.  Physical Exam: Vitals:   03/22/16 1735 03/22/16 2058 03/23/16 0101 03/23/16 0611  BP: (!) 147/82 126/72 111/67 (!) 98/52  Pulse: 75 80 84 72  Resp: 20 18 18 16   Temp: 98.2 F (36.8 C) 98.5 F (36.9 C) 98.2 F (36.8 C) 98.1 F (36.7 C)  TempSrc: Oral Oral Oral Oral  SpO2: 94% 94% 96% 94%  Weight:      Height:         GEN- The patient is well appearing, alert and oriented x 3 today.   Head- normocephalic, atraumatic Eyes-  Sclera clear, conjunctiva pink Ears- hearing intact Oropharynx- clear Neck- supple Lungs- soft scattered ronchi b/l, partial clearing with cough, with end-exp wheeze b/l, normal work of breathing Heart- RRR, no murmurs, rubs or gallops  GI- soft, NT, ND Extremities- no clubbing, cyanosis, or edema MS- no significant deformity or atrophy Skin- no rash or lesion Psych- euthymic mood, full affect   Labs:   Lab Results  Component Value Date   WBC 10.1 03/20/2016  HGB 13.9 03/20/2016   HCT 41.0 03/20/2016   MCV 84.0 03/20/2016   PLT 362 03/20/2016    Recent Labs Lab 03/20/16 1439 03/20/16 1456  NA 137 138  K 4.0 4.3  CL 101 103  CO2 26  --   BUN 17 19  CREATININE 1.22* 1.30*  CALCIUM 9.7  --   PROT 7.9  --   BILITOT 0.5  --   ALKPHOS 74  --   ALT 17  --   AST 24  --   GLUCOSE 121* 121*   Lab Results  Component Value Date   TROPONINI <0.03 05/29/2015   Lab Results  Component Value Date   CHOL 211 (H) 03/20/2016   CHOL 206 (H) 04/16/2015   Lab Results  Component Value Date   HDL 77 03/20/2016   HDL 69 04/16/2015   Lab Results  Component Value Date   LDLCALC 122 (H) 03/20/2016   LDLCALC 121 04/16/2015   Lab Results  Component Value Date   TRIG 60 03/20/2016   TRIG 80 04/16/2015   Lab Results  Component Value Date   CHOLHDL 2.7 03/20/2016   CHOLHDL 3.0 04/16/2015   No results found for: LDLDIRECT  No results found for: DDIMER   Radiology/Studies:  Mr Brain Wo Contrast Addendum Date: 03/21/2016   ADDENDUM REPORT: 03/21/2016 18:04 ADDENDUM: Study discussed by telephone with Dr. Weston SettleSHERVIN ESHRAGHI on 03/21/2016 at 1800 hours. Electronically Signed   By: Odessa FlemingH  Hall M.D.   On: 03/21/2016 18:04  Result Date: 03/21/2016 CLINICAL DATA:  66 year old female code stroke patient with sudden numbness and weakness of the left side of the body. Initial  encounter. EXAM: MRI HEAD WITHOUT CONTRAST MRA HEAD WITHOUT CONTRAST TECHNIQUE: Multiplanar, multiecho pulse sequences of the brain and surrounding structures were obtained without intravenous contrast. Angiographic images of the head were obtained using MRA technique without contrast. COMPARISON:  Head CT without contrast 01/17/2017 FINDINGS: MRI HEAD FINDINGS Brain: Scattered small foci of restricted diffusion in the left ACA territory (series 3, image 3939), questionably also in the right ACA territory (image 40), in the left temporal lobe superior temporal gyrus (series 5, image 19), and in the posterior left MCA and PCA territories, including the medial left occipital lobe series 3, image 21. Furthermore there is also a probable small focus of restricted diffusion in the right dorsal pons (series 3, image 17 and series 5, image 28. But no other definite posterior fossa involvement. Note that there is no right MCA territory diffusion restriction. Mild if any associated T2 and FLAIR hyperintensity in these areas. No associated hemorrhage or mass effect. Superimposed chronic right caudate lacunar infarct and patchy/confluent bilateral cerebral white matter T2 and FLAIR hyperintensity. Negative thalami cerebellum. No definite cortical encephalomalacia. No midline shift, mass effect, evidence of mass lesion, ventriculomegaly, extra-axial collection or acute intracranial hemorrhage. Cervicomedullary junction and pituitary are within normal limits. Vascular: Major intracranial vascular flow voids are preserved. Skull and upper cervical spine: Negative. Visualized bone marrow signal is within normal limits. Sinuses/Orbits: Negative. Other: Visible internal auditory structures appear normal. Mastoids are clear. Negative scalp soft tissues. MRA HEAD FINDINGS Intermittently degraded by motion artifact. Antegrade flow in the posterior circulation with codominant distal vertebral arteries. No definite distal vertebral  stenosis. Patent PICA origins. Patent vertebrobasilar junction. No basilar stenosis. SCA and right PCA origins are normal. Suggestion of an occluded left PCA P1 segment (series 1205, image 7). Right posterior communicating artery is also present, such that the right PCA inferior and  superior divisions are supplied separately (normal variant). There is asymmetrically decreased distal left PCA flow (series 1206, image 11). Antegrade flow in both ICA siphons. No ICA stenosis. Patent carotid termini. However, the right MCA M1 segment is occluded just beyond its origin (series 12, image 86) with no reconstituted flow signal. The right A1 segment appears mildly dominant. The left A1 could be irregular. The anterior communicating artery and visualized ACA branches are within normal limits. The left MCA M1 segment, left MCA bifurcation, and visible left MCA branches are patent. IMPRESSION: 1. Scattered small foci of restricted diffusion in the left ACA (possibly also right ACA), left MCA, left PCA, and probably also the basilar perforator artery (in the right dorsal pons) territories. No associated hemorrhage or mass effect. 2. 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 There is also evidence of Left PCA P1 occlusion and decreased distal left PCA flow, but patent left posterior communicating artery supply to the left PCA. No proximal left MCA or ACA stenosis or occlusion. 3. Chronic right caudate lacunar infarct and age advanced bilateral cerebral white matter signal changes. Electronically Signed: By: Odessa Fleming M.D. On: 03/21/2016 17:51    Ct Head Code Stroke W/o Cm Result Date: 03/20/2016 CLINICAL DATA:  Code stroke. Sudden numbness and weakness of the left side of body. EXAM: CT HEAD WITHOUT CONTRAST TECHNIQUE: Contiguous axial images were obtained from the base of the skull through the vertex without intravenous contrast. COMPARISON:  None. FINDINGS:  Brain: No evidence of acute infarction, hemorrhage, hydrocephalus, extra-axial collection or mass lesion/mass effect. Minimal fat along the right fornix, incidental. Extensive low-density in the cerebral white matter consistent chronic microvascular disease. Small incidental retro cerebellar CSF collection. Vascular: Atherosclerotic calcification. Skull: Negative Sinuses/Orbits: Negative Other: These results were called by telephone at the time of interpretation on 03/20/2016 at 3:12 pm to Dr. Patria Mane, who verbally acknowledged these results. ASPECTS [facility] Stroke Program Early CT Score) - Ganglionic level infarction (caudate, lentiform nuclei, internal capsule, insula, M1-M3 cortex): 7 - Supraganglionic infarction (M4-M6 cortex): 3 Total score (0-10 with 10 being normal): 10 IMPRESSION: 1. No acute finding. ASPECTS is 10. 2. Extensive chronic microvascular disease in the cerebral white matter. Electronically Signed   By: Marnee Spring M.D.   On: 03/20/2016 15:15    12-lead ECG SR All prior EKG's in EPIC reviewed with no documented atrial fibrillation  Telemetry SR only  Assessment and Plan:  1. Cryptogenic stroke  The patient presents with cryptogenic stroke.  The patient has a TEE planned for this AM.  I spoke at length with the patient about monitoring for afib with either a 30 day event monitor or an implantable loop recorder.  Risks, benefits, and alteratives to implantable loop recorder were discussed with the patient today.   At this time, the patient is very clear in their decision to proceed with implantable loop recorder.   Wound care was reviewed with the patient (keep incision clean and dry for 3 days).  Wound check is scheduled for 04/05/16  2. Smoking     She is counseled and was receptive   Please call with questions.   Renee Norberto Sorenson, PA-C 03/23/2016  EP Attending  Patient seen and examined. Agree with above. 66 yo woman with cryptogenic stroke admitted for evaluation and  treatment. She is pending TEE and if negative will plan to proceed with ILR insertion to look for atrial fibrillation. She needs to stop smoking.  Sharlot Gowda  Taylor,M.D.

## 2016-03-23 NOTE — Consult Note (Signed)
  ELECTROPHYSIOLOGY CONSULT NOTE  Patient ID: Lauren Koch MRN: [id number], DOB/AGE: 04/23/1950   Admit date: 03/20/2016 Date of Consult: 03/23/2016  Primary Physician: DIXON,MARY BETH, PA-C Primary Cardiologist: none Reason for Consultation: Cryptogenic stroke ; recommendations regarding Implantable Loop Recorder  History of Present Illness Lauren Koch was admitted on 03/20/2016 with PMHx of COPD w/ongoing smoking, HTN, asthma admitted to [facility] 03/21/15 with CVA.  She first developed symptoms while at home, onset and symptoms were unclear with c/o L sided weakness, dizziness, she received t-PA.  Imaging demonstrated scattered L ACA, L MCA, L PCA and R dorsal pontine territory infarcts.  she has undergone workup for stroke including carotid dopplers (prelim 1-39% b/l disease), her echocardiogram is pending.  The patient has been monitored on telemetry which has demonstrated sinus rhythm with no arrhythmias.  Inpatient stroke work-up is to be completed with a TEE.   Echocardiogram this admission  Completed this morning, pending result TEE scheduled for this afternoon  Lab work is reviewed.  Prior to admission, the patient denies chest pain, shortness of breath, dizziness, palpitations, or syncope.  She reports full recovery from her stroke with disposition plans to home at discharge.  EP has been asked to evaluate for placement of an implantable loop recorder to monitor for atrial fibrillation.     Past Medical History:  Diagnosis Date  . Asthma   . Cataract   . COPD (chronic obstructive pulmonary disease) (HCC)   . GERD (gastroesophageal reflux disease)   . Hypertension   . Smoker      Surgical History:  Past Surgical History:  Procedure Laterality Date  . CESAREAN SECTION  1971/1976  . HERNIA REPAIR  2013  . PARTIAL HYMENECTOMY       Prescriptions Prior to Admission  Medication Sig Dispense Refill Last Dose  . albuterol (PROVENTIL HFA;VENTOLIN HFA) 108 (90 Base)  MCG/ACT inhaler [name] 1 Inhaler 2 Past Month at Unknown time  . alendronate (FOSAMAX) 70 MG tablet Take 1 tablet (70 mg total) by mouth every 7 (seven) days. Take with a full glass of water on an empty stomach. 4 tablet 3 03/16/2016 at am  . amLODipine (NORVASC) 5 MG tablet TAKE ONE TABLET BY MOUTH ONCE DAILY 30 tablet 5 03/20/2016 at am  . diphenhydrAMINE (BENADRYL) 25 MG tablet Take 25 mg by mouth every 6 (six) hours as needed for sleep.   PRN at PRN  . Fluticasone-Salmeterol (ADVAIR DISKUS) 250-50 MCG/DOSE AEPB Inhale 1 puff into the lungs 2 (two) times daily. 1 each 3 03/19/2016 at pm  . hydrochlorothiazide (HYDRODIURIL) 25 MG tablet TAKE ONE TABLET BY MOUTH ONCE DAILY 30 tablet 5 03/20/2016 at am  . lisinopril (PRINIVIL,[name] TAKE ONE TABLET BY MOUTH AT BEDTIME (Patient taking differently: [name]) 30 tablet 5 03/20/2016 at am  . omeprazole (PRILOSEC) 20 MG capsule Take 1 capsule (20 mg total) by mouth daily. Patient need office visit before anymore refills (Patient taking differently: Take 20 mg by mouth daily before breakfast. ) 30 capsule 0 03/20/2016 at am  . potassium chloride SA (K-DUR,KLOR-CON) 20 MEQ tablet Take 1 tablet (20 mEq total) by mouth daily. 7 tablet 0 03/20/2016 at am  . tiotropium (SPIRIVA HANDIHALER) 18 MCG inhalation capsule Place 1 capsule (18 mcg total) into inhaler and inhale daily. 30 capsule 5 03/20/2016 at am  .   Vitamin D, Ergocalciferol, (DRISDOL) 50000 units CAPS capsule Take 1 capsule (50,000 Units total) by mouth every 7 (seven) days. 12 capsule 0 03/17/2016 at am  . buPROPion (WELLBUTRIN SR) 150 MG 12 hr tablet Take one daily for 5 days then take 1 twice a day (Patient not taking: Reported on 03/20/2016) 60 tablet 4 Not Taking at Unknown time  . cyclobenzaprine (FLEXERIL) 10 MG tablet Take 1 tablet (10 mg total) by mouth 3 (three) times daily as  needed for muscle spasms. (Patient not taking: Reported on 03/20/2016) 30 tablet 1 Not Taking at Unknown time    Inpatient Medications:  . amLODipine  5 mg Oral Daily  . aspirin  325 mg Oral Daily  . atorvastatin  40 mg Oral q1800  . hydrochlorothiazide  25 mg Oral Daily  . lisinopril  20 mg Oral Daily  . mometasone-formoterol  2 puff Inhalation BID  . montelukast  10 mg Oral QHS  . pantoprazole  40 mg Oral QHS  . potassium chloride SA  20 mEq Oral Daily  . tiotropium  18 mcg Inhalation Daily    Allergies:  Allergies  Allergen Reactions  . Prednisone Rash and Other (See Comments)    Arrhythmia, also (Has tolerated hydrocortisone and decadron)     Social History   Social History  . Marital status: Married    Spouse name: N/A  . Number of children: N/A  . Years of education: N/A   Occupational History  . Not on file.   Social History Main Topics  . Smoking status: Current Some Day Smoker    Packs/day: 0.50    Years: 30.00    Types: Cigarettes  . Smokeless tobacco: Former User    Quit date: 05/29/2015  . Alcohol use No  . Drug use: No  . Sexual activity: Not Currently    Birth control/ protection: None   Other Topics Concern  . Not on file   Social History Narrative  . No narrative on file     Family History  Problem Relation Age of Onset  . Diabetes Mother   . Hypertension Mother   . Miscarriages / Stillbirths Mother   . Heart disease Father   . Diabetes Father   . Hypertension Father   . Cancer Sister     skin  . Diabetes Other       Review of Systems: All other systems reviewed and are otherwise negative except as noted above.  Physical Exam: Vitals:   03/22/16 1735 03/22/16 2058 03/23/16 0101 03/23/16 0611  BP: (!) 147/82 126/72 111/67 (!) 98/52  Pulse: 75 80 84 72  Resp: 20 18 18 16  Temp: 98.2 F (36.8 C) 98.5 F (36.9 C) 98.2 F (36.8 C) 98.1 F (36.7 C)  TempSrc: Oral Oral Oral Oral  SpO2: 94% 94% 96% 94%  Weight:      Height:         GEN- The patient is well appearing, alert and oriented x 3 today.   Head- normocephalic, atraumatic Eyes-  Sclera clear, conjunctiva pink Ears- hearing intact Oropharynx- clear Neck- supple Lungs- soft scattered ronchi b/l, partial clearing with cough, with end-exp wheeze b/l, normal work of breathing Heart- RRR, no murmurs, rubs or gallops  GI- soft, NT, ND Extremities- no clubbing, cyanosis, or edema MS- no significant deformity or atrophy Skin- no rash or lesion Psych- euthymic mood, full affect   Labs:   Lab Results  Component Value Date   WBC 10.1 03/20/2016     HGB 13.9 03/20/2016   HCT 41.0 03/20/2016   MCV 84.0 03/20/2016   PLT 362 03/20/2016    Recent Labs Lab 03/20/16 1439 03/20/16 1456  NA 137 138  K 4.0 4.3  CL 101 103  CO2 26  --   BUN 17 19  CREATININE 1.22* 1.30*  CALCIUM 9.7  --   PROT 7.9  --   BILITOT 0.5  --   ALKPHOS 74  --   ALT 17  --   AST 24  --   GLUCOSE 121* 121*   Lab Results  Component Value Date   TROPONINI <0.03 05/29/2015   Lab Results  Component Value Date   CHOL 211 (H) 03/20/2016   CHOL 206 (H) 04/16/2015   Lab Results  Component Value Date   HDL 77 03/20/2016   HDL 69 04/16/2015   Lab Results  Component Value Date   LDLCALC 122 (H) 03/20/2016   LDLCALC 121 04/16/2015   Lab Results  Component Value Date   TRIG 60 03/20/2016   TRIG 80 04/16/2015   Lab Results  Component Value Date   CHOLHDL 2.7 03/20/2016   CHOLHDL 3.0 04/16/2015   No results found for: LDLDIRECT  No results found for: DDIMER   Radiology/Studies:  Mr Brain Wo Contrast Addendum Date: 03/21/2016   ADDENDUM REPORT: 03/21/2016 18:04 ADDENDUM: Study discussed by telephone with Dr. SHERVIN ESHRAGHI on 03/21/2016 at 1800 hours. Electronically Signed   By: H  Hall M.D.   On: 03/21/2016 18:04  Result Date: 03/21/2016 CLINICAL DATA:  65-year-old female code stroke patient with sudden numbness and weakness of the left side of the body. Initial  encounter. EXAM: MRI HEAD WITHOUT CONTRAST MRA HEAD WITHOUT CONTRAST TECHNIQUE: Multiplanar, multiecho pulse sequences of the brain and surrounding structures were obtained without intravenous contrast. Angiographic images of the head were obtained using MRA technique without contrast. COMPARISON:  Head CT without contrast 01/17/2017 FINDINGS: MRI HEAD FINDINGS Brain: Scattered small foci of restricted diffusion in the left ACA territory (series 3, image 39), questionably also in the right ACA territory (image 40), in the left temporal lobe superior temporal gyrus (series 5, image 19), and in the posterior left MCA and PCA territories, including the medial left occipital lobe series 3, image 21. Furthermore there is also a probable small focus of restricted diffusion in the right dorsal pons (series 3, image 17 and series 5, image 28. But no other definite posterior fossa involvement. Note that there is no right MCA territory diffusion restriction. Mild if any associated T2 and FLAIR hyperintensity in these areas. No associated hemorrhage or mass effect. Superimposed chronic right caudate lacunar infarct and patchy/confluent bilateral cerebral white matter T2 and FLAIR hyperintensity. Negative thalami cerebellum. No definite cortical encephalomalacia. No midline shift, mass effect, evidence of mass lesion, ventriculomegaly, extra-axial collection or acute intracranial hemorrhage. Cervicomedullary junction and pituitary are within normal limits. Vascular: Major intracranial vascular flow voids are preserved. Skull and upper cervical spine: Negative. Visualized bone marrow signal is within normal limits. Sinuses/Orbits: Negative. Other: Visible internal auditory structures appear normal. Mastoids are clear. Negative scalp soft tissues. MRA HEAD FINDINGS Intermittently degraded by motion artifact. Antegrade flow in the posterior circulation with codominant distal vertebral arteries. No definite distal vertebral  stenosis. Patent PICA origins. Patent vertebrobasilar junction. No basilar stenosis. SCA and right PCA origins are normal. Suggestion of an occluded left PCA P1 segment (series 1205, image 7). Right posterior communicating artery is also present, such that the right PCA inferior and   superior divisions are supplied separately (normal variant). There is asymmetrically decreased distal left PCA flow (series 1206, image 11). Antegrade flow in both ICA siphons. No ICA stenosis. Patent carotid termini. However, the right MCA M1 segment is occluded just beyond its origin (series 12, image 86) with no reconstituted flow signal. The right A1 segment appears mildly dominant. The left A1 could be irregular. The anterior communicating artery and visualized ACA branches are within normal limits. The left MCA M1 segment, left MCA bifurcation, and visible left MCA branches are patent. IMPRESSION: 1. Scattered small foci of restricted diffusion in the left ACA (possibly also right ACA), left MCA, left PCA, and probably also the basilar perforator artery (in the right dorsal pons) territories. No associated hemorrhage or mass effect. 2. 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 There is also evidence of Left PCA P1 occlusion and decreased distal left PCA flow, but patent left posterior communicating artery supply to the left PCA. No proximal left MCA or ACA stenosis or occlusion. 3. Chronic right caudate lacunar infarct and age advanced bilateral cerebral white matter signal changes. Electronically Signed: By: H  Hall M.D. On: 03/21/2016 17:51    Ct Head Code Stroke W/o Cm Result Date: 03/20/2016 CLINICAL DATA:  Code stroke. Sudden numbness and weakness of the left side of body. EXAM: CT HEAD WITHOUT CONTRAST TECHNIQUE: Contiguous axial images were obtained from the base of the skull through the vertex without intravenous contrast. COMPARISON:  None. FINDINGS:  Brain: No evidence of acute infarction, hemorrhage, hydrocephalus, extra-axial collection or mass lesion/mass effect. Minimal fat along the right fornix, incidental. Extensive low-density in the cerebral white matter consistent chronic microvascular disease. Small incidental retro cerebellar CSF collection. Vascular: Atherosclerotic calcification. Skull: Negative Sinuses/Orbits: Negative Other: These results were called by telephone at the time of interpretation on 03/20/2016 at 3:12 pm to Dr. Campos, who verbally acknowledged these results. ASPECTS ([place] Stroke Program Early CT Score) - Ganglionic level infarction (caudate, lentiform nuclei, internal capsule, insula, M1-M3 cortex): 7 - Supraganglionic infarction (M4-M6 cortex): 3 Total score (0-10 with 10 being normal): 10 IMPRESSION: 1. No acute finding. ASPECTS is 10. 2. Extensive chronic microvascular disease in the cerebral white matter. Electronically Signed   By: Jonathon  Watts M.D.   On: 03/20/2016 15:15    12-lead ECG SR All prior EKG's in EPIC reviewed with no documented atrial fibrillation  Telemetry SR only  Assessment and Plan:  1. Cryptogenic stroke  The patient presents with cryptogenic stroke.  The patient has a TEE planned for this AM.  I spoke at length with the patient about monitoring for afib with either a 30 day event monitor or an implantable loop recorder.  Risks, benefits, and alteratives to implantable loop recorder were discussed with the patient today.   At this time, the patient is very clear in their decision to proceed with implantable loop recorder.   Wound care was reviewed with the patient (keep incision clean and dry for 3 days).  Wound check is scheduled for 04/05/16  2. Smoking     She is counseled and was receptive   Please call with questions.   Renee Lynn Ursuy, PA-C 03/23/2016  EP Attending  Patient seen and examined. Agree with above. 65 yo woman with cryptogenic stroke admitted for evaluation and  treatment. She is pending TEE and if negative will plan to proceed with ILR insertion to look for atrial fibrillation. She needs to stop smoking.  Gregg   Taylor,M.D. 

## 2016-03-23 NOTE — Discharge Summary (Signed)
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  Past Medical History:  Diagnosis Date  . Asthma   . Cataract   . COPD (chronic obstructive pulmonary disease) (HCC)   . GERD (gastroesophageal reflux disease)   . Hypertension   . Smoker    Past Surgical History:  Procedure Laterality Date  . CESAREAN SECTION  1971/1976  . HERNIA REPAIR  2013  . PARTIAL HYMENECTOMY      Allergies as of 03/23/2016      Reactions   Prednisone Rash, Other (See Comments)   Arrhythmia, also (Has tolerated hydrocortisone and decadron)      Medication List    STOP taking these medications   buPROPion 150 MG 12 hr tablet Commonly known as:  WELLBUTRIN SR   cyclobenzaprine 10 MG tablet Commonly known as:  FLEXERIL   diphenhydrAMINE 25 MG tablet Commonly known as:  BENADRYL     TAKE these medications   albuterol 108 (90 Base) MCG/ACT inhaler Commonly known as:  PROVENTIL HFA;VENTOLIN HFA [name]   alendronate 70 MG tablet Commonly known as:  FOSAMAX Take 1 tablet (70 mg total) by mouth every 7 (seven) days. Take with a full glass of water on an empty stomach.   amLODipine 5 MG tablet Commonly known as:  NORVASC TAKE ONE TABLET BY MOUTH ONCE DAILY   aspirin 325 MG tablet Take 1 tablet (325 mg total) by mouth daily. Start taking on:  03/24/2016   atorvastatin 40 MG tablet Commonly known as:   LIPITOR [name]   Fluticasone-Salmeterol 250-50 MCG/DOSE Aepb Commonly known as:  ADVAIR DISKUS Inhale 1 puff into the lungs 2 (two) times daily.   hydrochlorothiazide 25 MG tablet Commonly known as:  HYDRODIURIL TAKE ONE TABLET BY MOUTH ONCE DAILY   lisinopril 20 MG tablet Commonly known as:  PRINIVIL,ZESTRIL [name] What changed:  See the new instructions.   omeprazole 20 MG capsule Commonly known as:  PRILOSEC Take 1 capsule (20 mg total) by mouth daily before breakfast.   potassium chloride SA 20 MEQ tablet Commonly known as:  K-DUR,KLOR-CON Take 1 tablet (20 mEq total) by mouth daily.   tiotropium 18 MCG inhalation capsule Commonly known as:  SPIRIVA HANDIHALER Place 1 capsule (18 mcg total) into inhaler and inhale daily.   Vitamin D (Ergocalciferol) 50000 units Caps capsule Commonly known as:  DRISDOL Take 1 capsule (50,000 Units total) by mouth every 7 (seven) days.       LABORATORY STUDIES CBC    Component Value Date/Time   WBC 10.1 03/20/2016 1439   RBC 4.74 03/20/2016 1439   HGB 13.9 03/20/2016 1456   HCT 41.0 03/20/2016 1456   PLT 362 03/20/2016 1439   MCV 84.0 03/20/2016 1439   MCH 28.3 03/20/2016  1439   MCHC 33.7 03/20/2016 1439   RDW 14.1 03/20/2016 1439   LYMPHSABS 1.7 03/20/2016 1439   MONOABS 0.9 03/20/2016 1439   EOSABS 0.1 03/20/2016 1439   BASOSABS 0.0 03/20/2016 1439   CMP    Component Value Date/Time   NA 138 03/20/2016 1456   K 4.3 03/20/2016 1456   CL 103 03/20/2016 1456   CO2 26 03/20/2016 1439   GLUCOSE 121 (H) 03/20/2016 1456   BUN 19 03/20/2016 1456   CREATININE 1.30 (H) 03/20/2016 1456   CREATININE 1.03 (H) 04/16/2015 0902   CALCIUM 9.7 03/20/2016 1439   PROT 7.9 03/20/2016 1439   ALBUMIN 4.1 03/20/2016 1439   AST 24 03/20/2016 1439   ALT 17 03/20/2016 1439   ALKPHOS 74 03/20/2016 1439   BILITOT 0.5 03/20/2016 1439   GFRNONAA 46 (L) 03/20/2016 1439    GFRNONAA 58 (L) 04/16/2015 0902   GFRAA 53 (L) 03/20/2016 1439   GFRAA 66 04/16/2015 0902   COAGS Lab Results  Component Value Date   INR 0.89 03/20/2016   Lipid Panel    Component Value Date/Time   CHOL 211 (H) 03/20/2016 1817   TRIG 60 03/20/2016 1817   HDL 77 03/20/2016 1817   CHOLHDL 2.7 03/20/2016 1817   VLDL 12 03/20/2016 1817   LDLCALC 122 (H) 03/20/2016 1817   HgbA1C  Lab Results  Component Value Date   HGBA1C 6.2 (H) 03/20/2016   Urinalysis    Component Value Date/Time   COLORURINE STRAW (A) 03/20/2016 2036   APPEARANCEUR CLEAR 03/20/2016 2036   LABSPEC 1.010 03/20/2016 2036   PHURINE 5.0 03/20/2016 2036   GLUCOSEU NEGATIVE 03/20/2016 2036   HGBUR NEGATIVE 03/20/2016 2036   BILIRUBINUR NEGATIVE 03/20/2016 2036   KETONESUR NEGATIVE 03/20/2016 2036   PROTEINUR NEGATIVE 03/20/2016 2036   NITRITE NEGATIVE 03/20/2016 2036   LEUKOCYTESUR NEGATIVE 03/20/2016 2036   Urine Drug Screen     Component Value Date/Time   LABOPIA NONE DETECTED 03/20/2016 2036   COCAINSCRNUR NONE DETECTED 03/20/2016 2036   LABBENZ NONE DETECTED 03/20/2016 2036   AMPHETMU NONE DETECTED 03/20/2016 2036   THCU NONE DETECTED 03/20/2016 2036   LABBARB NONE DETECTED 03/20/2016 2036    Alcohol Level    Component Value Date/Time   ETH <5 03/20/2016 1444     SIGNIFICANT DIAGNOSTIC STUDIES Ct Head Code Stroke W/o Cm 03/20/2016 1. No acute finding. ASPECTS is 10. 2. Extensive chronic microvascular disease in the cerebral white matter.    Mr Brain Wo Contrast Mr Maxine GlennMra Head/brain Wo Cm 03/21/2016 1. Scattered small foci of restricted diffusion in the left ACA (possibly also right ACA), left MCA, left PCA, and probably also the basilar perforator artery (in the right dorsal pons) territories. No associated hemorrhage or mass effect. 2. 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 There is also evidence of Left PCA P1 occlusion  and decreased distal left PCA flow, but patent left posterior communicating artery supply to the left PCA. No proximal left MCA or ACA stenosis or occlusion. 3. Chronic right caudate lacunar infarct and age advanced bilateral cerebral white matter signal changes.     2D Echocardiogram  - Left ventricle: The cavity size was normal. Wall thickness was normal. Systolic function was normal. The estimated ejection fraction was in the range of 60% to 65%. Although no diagnostic regional wall motion abnormality was identified, this possibility cannot be completely excluded on the basis of this study. Doppler parameters are consistent  with abnormal left ventricular relaxation (grade 1 diastolic dysfunction). - Aortic valve: There was no stenosis. - Mitral valve: There was no significant regurgitation. - Right ventricle: The cavity size was normal. Systolic function was normal. - Pulmonary arteries: No complete TR doppler jet so unable to estimate PA systolic pressure. - Inferior vena cava: The vessel was normal in size. The respirophasic diameter changes were in the normal range (>= 50%), consistent with normal central venous pressure. Impressions:   Normal LV size with EF 60-65%. Normal RV size and systolic function. No significant valvular abnormalities.  Carotid Doppler   - The vertebral arteries appear patent with antegrade flow. - Findings consistent with a 1- 39 percent stenosis involving the right internal carotid artery and the left internal carotid artery.  TEE - Left ventricle: Systolic function was normal. The estimated ejection fraction was in the range of 60% to 65%. Wall motion was normal; there were no regional wall motion abnormalities. - Left atrium: No evidence of thrombus in the atrial cavity or appendage. No evidence of thrombus in the atrial cavity or appendage. - Right atrium: No evidence of thrombus in the atrial cavity or appendage. - Atrial septum: No defect or patent foramen  ovale was identified by color flow Doppler or saline microcavitation study.   HISTORY OF PRESENT ILLNESS Patient was transferred from [facility].  At about 12:30 pm she had sudden onset of left sided numbness and weakness associated with nausea, dizziness, and vomiting.  She apparently changed the history several times delaying her evaluation in the ER, but received IV tPA bolus at 15:51.  Somehow, the IV tubing disconnected and she did not get the whole infusion over the next hour.  EMS estimates about 1/3 may have gone in.   However, en route the EMS called me and told me that her weakness has resolved and she has only mild residual subjective numbness.  CT at OSH showed no bleed, but there was extensive periventricular small vessel ischemic disease.  CBC, CMP, and coags were normal at OSH except for Cr 1.22 and GFR 46.  Her known stroke risk factors are HTN, smoking 1/2 ppd, and high cholesterol.  She believes that she has had glucose > 200 at some point, but no one has officially diagnosed her with DM.   She passed the swallow study at OSH.      HOSPITAL COURSE Lauren Koch is a 66 y.o. female with history of hypertension, tobacco use, COPD, and asthma presenting with left-sided numbness and weakness associated with nausea vomiting and dizziness.  She received IV t-PA on 03/20/16 at 1551.   Stroke:  Scattered L anterior circulation infarcts as well as a R pontine infarct s/p IV tPA. Infarcts felt to be embolic, source unknown.   Resultant Mild left-sided weakness (which may be chronic)  Code stroke CT no acute finding. ASPECTS 10  MRI - scattered L ACA, L MCA, L PCA and R dorsal pontine territory infarcts. Old R caudate lacune  MRA - R MCA M1 occlusion. L PCA P1 occlusion.  Carotid Doppler - Bilat 1-39%, No significant stenosis   2D Echo - EF 60-65%. No source of embolus   TEE no LAA mass or thrombus, though poorly visualized. negative for embolic source. No  PFO.  implantable loop recorder placed 03/23/2016  LDL - 122  HgbA1c - 6.2  No antithrombotic prior to admission, now on aspirin 325 mg daily   Patient counseled to be compliant with her antithrombotic  medications  Ongoing aggressive stroke risk factor management  Therapy recommendations:  no therapy needs  Disposition: return home  Hypertension  Hx HTN. Blood pressure mildly low at times.  Resumed home meds  BP goal normotensive  Hyperlipidemia  Home meds: No lipid lowering medications prior to admission  LDL 122, goal < 70  Now on Lipitor 40 mg daily  Continue statin at discharge  Other Stroke Risk Factors  Advanced age  Cigarette smoker - advised to stop smoking  Morbid Obesity, Body mass index is 40.21 kg/m., recommend weight loss, diet and exercise as appropriate   Other Active Problems  Creatinine - 1.30   DISCHARGE EXAM Blood pressure 101/61, pulse 75, temperature 97.8 F (36.6 C), temperature source Oral, resp. rate (!) 21, height 5' (1.524 m), weight 93.4 kg (205 lb 14.6 oz), SpO2 96 %. Exam:  .Pleasant elderly caucasian lady Afebrile. Head is nontraumatic. Neck is supple without bruit.    Cardiac exam no murmur or gallop. Lungs are clear to auscultation. Distal pulses are well felt.  Neuro: Detailed Neurologic Exam  Speech:    Speech is normal; fluent and spontaneous with normal comprehension.  Cognition:    The patient is oriented to person, place, and time;   Cranial Nerves:    The pupils are equal, round, and reactive to light. Visual fields are full to finger confrontation. Extraocular movements are intact. Trigeminal sensation is intact and the muscles of mastication are normal. The face is symmetric. The palate elevates in the midline. Hearing intact. Voice is normal. Shoulder shrug is normal. The tongue has normal motion without fasciculations.   Coordination:    Normal finger to nose .   Motor Observation:    No asymmetry,  no atrophy, and no involuntary movements noted.diminished fine finger movements on left Tone:    Normal muscle tone.     Strength:    Mild left-sided weakness      Sensation: intact to LT  Discharge Diet   Diet Heart Room service appropriate? Yes; Fluid consistency: Thin liquids  DISCHARGE PLAN  Disposition:  Return home  aspirin 325 mg daily for secondary stroke prevention.  Ongoing risk factor control by Primary Care Physician at time of discharge  Follow-up DIXON,MARY BETH, PA-C in 2 weeks.  Follow up cardiology for loop check 04/05/2016 at 1600  Follow-up with Dr. Delia Heady, Stroke Clinic in 6 weeks, office to schedule an appointment.  35 minutes were spent preparing discharge.  Rhoderick Moody [facility] See Amion for Pager information 03/25/2016 4:30 PM  I have personally examined this patient, reviewed notes, independently viewed imaging studies, participated in medical decision making and plan of care.ROS completed by me personally and pertinent positives fully documented  I have made any additions or clarifications directly to the above note. Agree with note above.    Delia Heady, MD Medical Director [facility] Pager: [phone] 03/26/2016 5:44 AM

## 2016-03-23 NOTE — Interval H&P Note (Signed)
History and Physical Interval Note:  03/23/2016 3:31 PM  Lauren MayShelia D Phoenix  has presented today for surgery, with the diagnosis of STROKE  The various methods of treatment have been discussed with the patient and family. After consideration of risks, benefits and other options for treatment, the patient has consented to  Procedure(s): TRANSESOPHAGEAL ECHOCARDIOGRAM (TEE) (N/A) as a surgical intervention .  The patient's history has been reviewed, patient examined, no change in status, stable for surgery.  I have reviewed the patient's chart and labs.  Questions were answered to the patient's satisfaction.     Chilton Siiffany Enders, MD 03/23/2016  3:32 PM

## 2016-03-23 NOTE — CV Procedure (Signed)
Brief TEE Note  LVEF 55-60% No LA/LAA thrombus or mass noted, though LAA was poorly-visualized No significant valvular regurgitation or stenosis  During this procedure the patient is administered a total of Versed 3 mg and Fentanyl 37.5 mcg to achieve and maintain moderate conscious sedation.  The patient's heart rate, blood pressure, and oxygen saturation are monitored continuously during the procedure. The period of conscious sedation is 24 minutes, of which I was present face-to-face 100% of this time.   For additional details see full report.  Tineshia Becraft C. Duke Salviaandolph, MD, [facility]acidFACC  03/23/2016 4:01 PM

## 2016-03-24 ENCOUNTER — Encounter (HOSPITAL_COMMUNITY): Payer: Self-pay | Admitting: Internal Medicine

## 2016-03-24 LAB — VAS US CAROTID
LCCAPDIAS: 22 cm/s
LEFT ECA DIAS: -18 cm/s
LEFT VERTEBRAL DIAS: 22 cm/s
LICADDIAS: -27 cm/s
LICAPDIAS: -26 cm/s
LICAPSYS: -59 cm/s
Left CCA dist dias: -21 cm/s
Left CCA dist sys: -61 cm/s
Left CCA prox sys: 86 cm/s
Left ICA dist sys: -61 cm/s
RIGHT ECA DIAS: -14 cm/s
RIGHT VERTEBRAL DIAS: -16 cm/s
Right CCA prox dias: 21 cm/s
Right CCA prox sys: 81 cm/s
Right cca dist sys: -71 cm/s

## 2016-03-24 MED FILL — Bupivacaine HCl Preservative Free (PF) Inj 0.25%: INTRAMUSCULAR | Qty: 30 | Status: AC

## 2016-03-24 NOTE — Progress Notes (Signed)
Late Entry for missed modified Rankin Score    03/22/16 1300  Modified Rankin (Stroke Patients Only)  Pre-Morbid Rankin Score 0  Modified Rankin [id number]  Alva Kuenzel [place], [place]  [phone] 03/24/2016

## 2016-03-24 NOTE — Care Management Note (Signed)
Case Management Note  Patient Details  Name: Lauren Koch MRN: [id number] Date of Birth: [birth year]  Subjective/Objective:                    Action/Plan: 03/24/2016 at 0817 am: Pt discharged home late yesterday. No f/u or DME per PT/OT. Pt with insurance and PCP. No further needs per CM.  Expected Discharge Date:  03/23/16               Expected Discharge Plan:  Home/Self Care  In-House Referral:     Discharge planning Services     Post Acute Care Choice:    Choice offered to:     DME Arranged:    DME Agency:     HH Arranged:    HH Agency:     Status of Service:  Completed, signed off  If discussed at [employer] Length of Stay Meetings, dates discussed:    Additional Comments:  Kermit BaloKelli F Rosaire Cueto, RN 03/24/2016, 8:17 AM

## 2016-03-25 DIAGNOSIS — E785 Hyperlipidemia, unspecified: Secondary | ICD-10-CM | POA: Diagnosis present

## 2016-04-05 ENCOUNTER — Ambulatory Visit: Payer: Medicare Other

## 2016-04-07 ENCOUNTER — Ambulatory Visit: Payer: Medicare Other

## 2016-04-15 ENCOUNTER — Other Ambulatory Visit: Payer: Self-pay | Admitting: Internal Medicine

## 2016-04-19 ENCOUNTER — Emergency Department (HOSPITAL_COMMUNITY)
Admission: EM | Admit: 2016-04-19 | Discharge: 2016-04-19 | Disposition: A | Discharge status: Home / Self Care | Payer: Medicare Other | Attending: Dermatology | Admitting: Dermatology

## 2016-04-19 ENCOUNTER — Encounter (HOSPITAL_COMMUNITY): Payer: Self-pay | Admitting: Emergency Medicine

## 2016-04-19 DIAGNOSIS — R69 Illness, unspecified: Secondary | ICD-10-CM | POA: Diagnosis not present

## 2016-04-19 DIAGNOSIS — Z79899 Other long term (current) drug therapy: Secondary | ICD-10-CM | POA: Diagnosis not present

## 2016-04-19 DIAGNOSIS — J449 Chronic obstructive pulmonary disease, unspecified: Secondary | ICD-10-CM | POA: Insufficient documentation

## 2016-04-19 DIAGNOSIS — Y929 Unspecified place or not applicable: Secondary | ICD-10-CM | POA: Diagnosis not present

## 2016-04-19 DIAGNOSIS — Y999 Unspecified external cause status: Secondary | ICD-10-CM | POA: Insufficient documentation

## 2016-04-19 DIAGNOSIS — Z5321 Procedure and treatment not carried out due to patient leaving prior to being seen by health care provider: Secondary | ICD-10-CM | POA: Insufficient documentation

## 2016-04-19 DIAGNOSIS — Y9389 Activity, other specified: Secondary | ICD-10-CM | POA: Diagnosis not present

## 2016-04-19 DIAGNOSIS — J45909 Unspecified asthma, uncomplicated: Secondary | ICD-10-CM | POA: Insufficient documentation

## 2016-04-19 DIAGNOSIS — Z043 Encounter for examination and observation following other accident: Secondary | ICD-10-CM | POA: Diagnosis not present

## 2016-04-19 DIAGNOSIS — F17210 Nicotine dependence, cigarettes, uncomplicated: Secondary | ICD-10-CM | POA: Diagnosis not present

## 2016-04-19 DIAGNOSIS — W1839XA Other fall on same level, initial encounter: Secondary | ICD-10-CM | POA: Diagnosis not present

## 2016-04-19 DIAGNOSIS — Z7982 Long term (current) use of aspirin: Secondary | ICD-10-CM | POA: Diagnosis not present

## 2016-04-19 LAB — CBC WITH DIFFERENTIAL/PLATELET
Basophils Absolute: 0 K/uL (ref 0.0–0.1)
Basophils Relative: 0 %
Eosinophils Absolute: 0.2 K/uL (ref 0.0–0.7)
Eosinophils Relative: 2 %
HCT: 37.8 % (ref 36.0–46.0)
Hemoglobin: 12.7 g/dL (ref 12.0–15.0)
Lymphocytes Relative: 21 %
Lymphs Abs: 1.8 K/uL (ref 0.7–4.0)
MCH: 28.4 pg (ref 26.0–34.0)
MCHC: 33.6 g/dL (ref 30.0–36.0)
MCV: 84.6 fL (ref 78.0–100.0)
Monocytes Absolute: 1 K/uL (ref 0.1–1.0)
Monocytes Relative: 11 %
Neutro Abs: 5.7 K/uL (ref 1.7–7.7)
Neutrophils Relative %: 66 %
Platelets: 389 K/uL (ref 150–400)
RBC: 4.47 MIL/uL (ref 3.87–5.11)
RDW: 14.9 % (ref 11.5–15.5)
WBC: 8.7 K/uL (ref 4.0–10.5)

## 2016-04-19 LAB — BASIC METABOLIC PANEL WITH GFR
Anion gap: 8 (ref 5–15)
BUN: 18 mg/dL (ref 6–20)
CO2: 23 mmol/L (ref 22–32)
Calcium: 8.4 mg/dL — ABNORMAL LOW (ref 8.9–10.3)
Chloride: 101 mmol/L (ref 101–111)
Creatinine, Ser: 1.26 mg/dL — ABNORMAL HIGH (ref 0.44–1.00)
GFR calc Af Amer: 51 mL/min — ABNORMAL LOW (ref >60)
GFR calc non Af Amer: 44 mL/min — ABNORMAL LOW (ref >60)
Glucose, Bld: 118 mg/dL — ABNORMAL HIGH (ref 65–99)
Potassium: 3.5 mmol/L (ref 3.5–5.1)
Sodium: 132 mmol/L — ABNORMAL LOW (ref 135–145)

## 2016-04-19 NOTE — ED Triage Notes (Signed)
Pt reports several falls since stroke in Feb 2018. cbg en route 185. Pt reports got up from dining room table and reports fell on right side while trying to get to living room. Pt denies loc, hitting head. Pt denies being on blood thinners. Pt denies pain but reports landed on right side. nad noted.

## 2016-04-21 ENCOUNTER — Ambulatory Visit: Payer: Medicare Other

## 2016-04-22 ENCOUNTER — Ambulatory Visit (INDEPENDENT_AMBULATORY_CARE_PROVIDER_SITE_OTHER): Payer: Medicare Other | Admitting: *Deleted

## 2016-04-22 DIAGNOSIS — I639 Cerebral infarction, unspecified: Secondary | ICD-10-CM

## 2016-04-23 NOTE — Progress Notes (Signed)
Carelink Summary Report / Loop Recorder 

## 2016-05-03 ENCOUNTER — Ambulatory Visit: Payer: Medicare Other

## 2016-05-03 LAB — CUP PACEART REMOTE DEVICE CHECK
Implantable Pulse Generator Implant Date: 20180220
MDC IDC SESS DTM: 20180322203548

## 2016-05-05 ENCOUNTER — Encounter: Payer: Self-pay | Admitting: Internal Medicine

## 2016-05-11 ENCOUNTER — Ambulatory Visit: Admitting: Neurology

## 2016-05-20 ENCOUNTER — Other Ambulatory Visit: Payer: Self-pay | Admitting: Physician Assistant

## 2016-05-20 DIAGNOSIS — I10 Essential (primary) hypertension: Secondary | ICD-10-CM

## 2016-05-24 ENCOUNTER — Ambulatory Visit (INDEPENDENT_AMBULATORY_CARE_PROVIDER_SITE_OTHER): Payer: Medicare Other | Admitting: *Deleted

## 2016-05-24 DIAGNOSIS — I639 Cerebral infarction, unspecified: Secondary | ICD-10-CM | POA: Diagnosis not present

## 2016-05-25 NOTE — Progress Notes (Signed)
Carelink Summary Report / Loop Recorder 

## 2016-05-27 ENCOUNTER — Encounter: Payer: Self-pay | Admitting: Physician Assistant

## 2016-06-11 LAB — CUP PACEART REMOTE DEVICE CHECK
Implantable Pulse Generator Implant Date: 20180220
MDC IDC SESS DTM: 20180421203646

## 2016-06-21 ENCOUNTER — Ambulatory Visit (INDEPENDENT_AMBULATORY_CARE_PROVIDER_SITE_OTHER): Payer: Medicare Other | Admitting: *Deleted

## 2016-06-21 DIAGNOSIS — I639 Cerebral infarction, unspecified: Secondary | ICD-10-CM | POA: Diagnosis not present

## 2016-06-22 NOTE — Progress Notes (Signed)
Carelink Summary Report / Loop Recorder 

## 2016-06-24 LAB — CUP PACEART REMOTE DEVICE CHECK
Date Time Interrogation Session: 20180521213544
Implantable Pulse Generator Implant Date: 20180220

## 2016-07-01 ENCOUNTER — Other Ambulatory Visit: Payer: Self-pay

## 2016-07-01 NOTE — Patient Outreach (Signed)
Telephone outreach to patient to obtain mRS was successfully completed. MRS=0  Lauren Koch CM Assistant 

## 2016-07-21 ENCOUNTER — Ambulatory Visit (INDEPENDENT_AMBULATORY_CARE_PROVIDER_SITE_OTHER): Payer: Medicare Other | Admitting: *Deleted

## 2016-07-21 DIAGNOSIS — I639 Cerebral infarction, unspecified: Secondary | ICD-10-CM

## 2016-07-22 NOTE — Progress Notes (Signed)
Carelink Summary Report / Loop Recorder 

## 2016-07-28 LAB — CUP PACEART REMOTE DEVICE CHECK
Implantable Pulse Generator Implant Date: 20180220
MDC IDC SESS DTM: 20180620213829

## 2016-08-03 ENCOUNTER — Telehealth: Payer: Self-pay

## 2016-08-03 ENCOUNTER — Ambulatory Visit: Admitting: Neurology

## 2016-08-03 NOTE — Telephone Encounter (Signed)
NO SHOW FOR APPT TODAY. PT IS A NEW PATIENT AT [facility] WAS SEEN IN THE HOSPITAL.

## 2016-08-09 ENCOUNTER — Encounter: Payer: Self-pay | Admitting: Neurology

## 2016-08-11 ENCOUNTER — Encounter: Payer: Self-pay | Admitting: Physician Assistant

## 2016-08-12 ENCOUNTER — Telehealth: Payer: Self-pay | Admitting: Cardiology

## 2016-08-12 NOTE — Telephone Encounter (Signed)
Spoke w/ pt and requested that she send a manual transmission b/c her home monitor has not updated in at least 14 days.   

## 2016-08-20 ENCOUNTER — Ambulatory Visit (INDEPENDENT_AMBULATORY_CARE_PROVIDER_SITE_OTHER): Payer: Medicare Other | Admitting: *Deleted

## 2016-08-20 DIAGNOSIS — I639 Cerebral infarction, unspecified: Secondary | ICD-10-CM

## 2016-08-24 NOTE — Progress Notes (Signed)
Carelink Summary Report / Loop Recorder 

## 2016-08-28 DIAGNOSIS — J449 Chronic obstructive pulmonary disease, unspecified: Secondary | ICD-10-CM | POA: Diagnosis not present

## 2016-08-28 DIAGNOSIS — R9082 White matter disease, unspecified: Secondary | ICD-10-CM | POA: Diagnosis not present

## 2016-08-28 DIAGNOSIS — F17200 Nicotine dependence, unspecified, uncomplicated: Secondary | ICD-10-CM | POA: Diagnosis not present

## 2016-08-28 DIAGNOSIS — W07XXXA Fall from chair, initial encounter: Secondary | ICD-10-CM | POA: Diagnosis not present

## 2016-08-28 DIAGNOSIS — R55 Syncope and collapse: Secondary | ICD-10-CM | POA: Diagnosis not present

## 2016-08-28 DIAGNOSIS — I10 Essential (primary) hypertension: Secondary | ICD-10-CM | POA: Diagnosis not present

## 2016-08-28 DIAGNOSIS — Z043 Encounter for examination and observation following other accident: Secondary | ICD-10-CM | POA: Diagnosis not present

## 2016-09-03 ENCOUNTER — Telehealth: Payer: Self-pay | Admitting: *Deleted

## 2016-09-03 NOTE — Telephone Encounter (Signed)
LMOM (DPR) requesting manual transmission from Carelink monitor for review of any episodes that have not transmitted automatically.  Gave [facility] phone number for questions/concerns.

## 2016-09-04 LAB — CUP PACEART REMOTE DEVICE CHECK
MDC IDC PG IMPLANT DT: 20180220
MDC IDC SESS DTM: 20180720223732

## 2016-09-04 NOTE — Progress Notes (Signed)
Carelink summary report received. Battery status OK. Normal device function. No new symptom episodes, tachy episodes, brady, or pause episodes. 1 AF 0%- ECG appears SR w/ PACs, runs of PAT. +ASA 325mg . Monthly summary reports and ROV/PRN

## 2016-09-08 NOTE — Telephone Encounter (Signed)
LMOVM regarding sending [facility] for review of episodes that have not transmitted automatically. Gave Device Clinic phone number for questions and concerns.

## 2016-09-17 NOTE — Telephone Encounter (Signed)
Attempted to reach patient at all numbers listed in chart to request a manual transmission.  Two numbers out of service and removed from chart.  LMOVM for patient and LVOM for daughter (DPR) requesting call back to the Device Clinic.

## 2016-09-20 ENCOUNTER — Ambulatory Visit (INDEPENDENT_AMBULATORY_CARE_PROVIDER_SITE_OTHER): Payer: Medicare Other | Admitting: *Deleted

## 2016-09-20 DIAGNOSIS — I639 Cerebral infarction, unspecified: Secondary | ICD-10-CM

## 2016-09-21 ENCOUNTER — Encounter: Payer: Self-pay | Admitting: *Deleted

## 2016-09-21 NOTE — Progress Notes (Signed)
Carelink Summary Report / Loop Recorder 

## 2016-09-21 NOTE — Telephone Encounter (Signed)
Letter sent via MyChart requesting manual transmission from [facility] monitor.  Instructions included.

## 2016-09-26 LAB — CUP PACEART REMOTE DEVICE CHECK
Date Time Interrogation Session: 20180819233817
Implantable Pulse Generator Implant Date: 20180220

## 2016-09-27 NOTE — Telephone Encounter (Signed)
Opened in Error.

## 2016-09-29 ENCOUNTER — Encounter: Payer: Self-pay | Admitting: Physician Assistant

## 2016-10-13 ENCOUNTER — Encounter: Payer: Self-pay | Admitting: *Deleted

## 2016-10-13 NOTE — Telephone Encounter (Signed)
Letter mailed requesting manual Carelink transmission.  Instructions included.

## 2016-10-15 ENCOUNTER — Other Ambulatory Visit: Payer: Self-pay | Admitting: Internal Medicine

## 2016-10-19 ENCOUNTER — Ambulatory Visit (INDEPENDENT_AMBULATORY_CARE_PROVIDER_SITE_OTHER): Payer: Medicare Other | Admitting: *Deleted

## 2016-10-19 DIAGNOSIS — I639 Cerebral infarction, unspecified: Secondary | ICD-10-CM | POA: Diagnosis not present

## 2016-10-20 NOTE — Progress Notes (Signed)
Carelink Summary Report / Loop Recorder 

## 2016-10-21 LAB — CUP PACEART REMOTE DEVICE CHECK
Date Time Interrogation Session: 20180918233827
MDC IDC PG IMPLANT DT: 20180220

## 2016-10-29 NOTE — Telephone Encounter (Signed)
Patient's daughter returned call.  She requests that we contact her in the future regarding' patient's home monitor as patient does not typically answer the phone.  Assisted patient's daughter with sending a manual Carelink transmission.  She is appreciative of assistance and denies questions or concerns at this time.  Transmission received, all "AF" episodes appear false, sinus rhythm with PACs.  Episodes printed and placed in LINQ folder for review by Dr. Ladona Ridgel.

## 2016-10-29 NOTE — Telephone Encounter (Signed)
Multiple unsuccessful attempts (phone calls and letters) made to reach patient or family regarding "AF" episodes that have not transmitted automatically.  Carelink monitor remains updated and transmitting, but ~5 episodes remain unable to view without a manual transmission.  12 month recall entered for follow-up with Dr. Ladona Ridgel in 03/2016.    Encounter closed.

## 2016-11-03 ENCOUNTER — Telehealth: Payer: Self-pay | Admitting: *Deleted

## 2016-11-03 NOTE — Telephone Encounter (Signed)
Manual transmission received. 2 "AF" episodes appear SR/ST with PACs. Will place in Dr. Lubertha Basque folder for review.

## 2016-11-03 NOTE — Telephone Encounter (Signed)
Lauren Koch aware transmission was received. She is about to go to work- if we need to reach her later, please call Cell number.

## 2016-11-03 NOTE — Telephone Encounter (Signed)
New message    Pt daughter is calling back stating she has a transmission sending. She asked for a call back.

## 2016-11-03 NOTE — Telephone Encounter (Signed)
Spoke with patient's daughter Chinita [place] [facility]) requesting manual transmission. Wyatt Mage states she will be with her mom in about 20-30 minutes and will call us back for assistance sending manual transmission. Gave Device Clinic phone number to call back.

## 2016-11-18 ENCOUNTER — Ambulatory Visit (INDEPENDENT_AMBULATORY_CARE_PROVIDER_SITE_OTHER): Payer: Medicare Other | Admitting: *Deleted

## 2016-11-18 DIAGNOSIS — I639 Cerebral infarction, unspecified: Secondary | ICD-10-CM | POA: Diagnosis not present

## 2016-11-19 LAB — CUP PACEART REMOTE DEVICE CHECK
Date Time Interrogation Session: 20181019000652
MDC IDC PG IMPLANT DT: 20180220

## 2016-11-19 NOTE — Progress Notes (Signed)
Carelink Summary Report / Loop Recorder 

## 2016-12-08 ENCOUNTER — Telehealth: Payer: Self-pay | Admitting: Cardiology

## 2016-12-08 NOTE — Telephone Encounter (Signed)
LMOVM requesting that pt send manual transmission b/c home monitor has not updated in at least 14 days.    

## 2016-12-09 ENCOUNTER — Telehealth: Payer: Self-pay | Admitting: *Deleted

## 2016-12-09 NOTE — Telephone Encounter (Signed)
Spoke with Tabitha (DPR), requested manual transmission for review.  She reports that they are in town right now and will send as soon as they get home.  She is appreciative of call and denies additional questions or concerns at this time.  Received alert for 5 "AF" episodes--no ECGs received.  Will review when full transmission received.

## 2016-12-13 ENCOUNTER — Telehealth: Payer: Self-pay

## 2016-12-13 NOTE — Telephone Encounter (Signed)
LVM- need manual transmission

## 2016-12-16 NOTE — Telephone Encounter (Signed)
Spoke with Tabitha (DPR), requested manual transmission for review. Full Report received, 5 "AF" episodes appear SR/ST. Will place in GT folder for review.

## 2016-12-20 ENCOUNTER — Ambulatory Visit (INDEPENDENT_AMBULATORY_CARE_PROVIDER_SITE_OTHER): Payer: Medicare Other | Admitting: *Deleted

## 2016-12-20 DIAGNOSIS — I639 Cerebral infarction, unspecified: Secondary | ICD-10-CM | POA: Diagnosis not present

## 2016-12-21 NOTE — Progress Notes (Signed)
Carelink Summary Report / Loop Recorder 

## 2016-12-22 ENCOUNTER — Telehealth: Payer: Self-pay | Admitting: *Deleted

## 2016-12-22 NOTE — Telephone Encounter (Signed)
Spoke with Tabitha (DPR) to schedule [facility].  Frequent false AF alerts--reprogram AF duration to >/= 10min per Dr. Ladona Ridgelaylor.  Wyatt Mageabitha is agreeable to [facility] on 01/04/17 at 1:00pm.  She is appreciative of call and explanation and denies additional questions or concerns at this time.

## 2017-01-04 ENCOUNTER — Ambulatory Visit (INDEPENDENT_AMBULATORY_CARE_PROVIDER_SITE_OTHER): Payer: Self-pay | Admitting: *Deleted

## 2017-01-04 DIAGNOSIS — I639 Cerebral infarction, unspecified: Secondary | ICD-10-CM

## 2017-01-04 LAB — CUP PACEART INCLINIC DEVICE CHECK
Date Time Interrogation Session: 20181204162438
Implantable Pulse Generator Implant Date: 20180220

## 2017-01-04 LAB — CUP PACEART REMOTE DEVICE CHECK
Date Time Interrogation Session: 20181118011557
Implantable Pulse Generator Implant Date: 20180220

## 2017-01-04 NOTE — Progress Notes (Signed)
Loop check in clinic. Battery status: GOOD. R-waves 0.2669mV. 0 symptom episodes, 0 tachy episodes. 1 pause episodes-- false, signal drop during implant. 0 brady episodes. 32 AF episodes (<0.1% burden)-- false, previously reviewed, ECG appears SR with PACs. Per appointment note, AT/AF recording threshold reprogrammed to episodes >= 10 minute from all episodes. Monthly summary reports and ROV with GT PRN.

## 2017-01-05 ENCOUNTER — Other Ambulatory Visit: Payer: Self-pay | Admitting: Internal Medicine

## 2017-01-05 ENCOUNTER — Telehealth: Payer: Self-pay | Admitting: *Deleted

## 2017-01-05 NOTE — Telephone Encounter (Signed)
LMOVM for Tabitha [facility](DPR) requesting manual Carelink transmission to force LINQ programming to sync with the Carelink network.  Device was reprogrammed on 01/04/17 per notes.

## 2017-01-12 ENCOUNTER — Ambulatory Visit: Payer: Medicare Other | Admitting: Family Medicine

## 2017-01-17 ENCOUNTER — Ambulatory Visit (INDEPENDENT_AMBULATORY_CARE_PROVIDER_SITE_OTHER): Payer: Medicare Other | Admitting: *Deleted

## 2017-01-17 DIAGNOSIS — I639 Cerebral infarction, unspecified: Secondary | ICD-10-CM | POA: Diagnosis not present

## 2017-01-18 NOTE — Progress Notes (Signed)
Carelink Summary Report / Loop Recorder 

## 2017-01-19 ENCOUNTER — Other Ambulatory Visit: Payer: Self-pay | Admitting: Internal Medicine

## 2017-01-21 NOTE — Telephone Encounter (Signed)
LMOVM for Tabitha [facility](DPR) requesting manual Carelink transmission to force LINQ programming to sync with the Carelink network.  Gave Device Clinic phone number for questions/concerns.

## 2017-01-27 LAB — CUP PACEART REMOTE DEVICE CHECK
Implantable Pulse Generator Implant Date: 20180220
MDC IDC SESS DTM: 20181218010954

## 2017-01-28 NOTE — Telephone Encounter (Signed)
Spoke with Tabitha (DPR) requesting a manual transmission to force LINQ programming to sync to network. Patient's [place] 01[place]17 and will send it when they return home.

## 2017-01-30 ENCOUNTER — Emergency Department
Admission: EM | Admit: 2017-01-30 | Discharge: 2017-01-31 | Disposition: A | Discharge status: Home / Self Care | Payer: Medicare Other | Attending: Emergency Medicine | Admitting: Emergency Medicine

## 2017-01-30 ENCOUNTER — Other Ambulatory Visit: Payer: Self-pay

## 2017-01-30 ENCOUNTER — Emergency Department: Payer: Medicare Other

## 2017-01-30 ENCOUNTER — Encounter: Payer: Self-pay | Admitting: Emergency Medicine

## 2017-01-30 DIAGNOSIS — Z79899 Other long term (current) drug therapy: Secondary | ICD-10-CM | POA: Insufficient documentation

## 2017-01-30 DIAGNOSIS — R0602 Shortness of breath: Secondary | ICD-10-CM

## 2017-01-30 DIAGNOSIS — J45909 Unspecified asthma, uncomplicated: Secondary | ICD-10-CM | POA: Diagnosis not present

## 2017-01-30 DIAGNOSIS — F17210 Nicotine dependence, cigarettes, uncomplicated: Secondary | ICD-10-CM | POA: Diagnosis not present

## 2017-01-30 DIAGNOSIS — Z7982 Long term (current) use of aspirin: Secondary | ICD-10-CM | POA: Diagnosis not present

## 2017-01-30 DIAGNOSIS — J441 Chronic obstructive pulmonary disease with (acute) exacerbation: Secondary | ICD-10-CM

## 2017-01-30 DIAGNOSIS — I10 Essential (primary) hypertension: Secondary | ICD-10-CM | POA: Diagnosis not present

## 2017-01-30 LAB — CBC WITH DIFFERENTIAL/PLATELET
BASOS PCT: 0 %
Basophils Absolute: 0 10*3/uL (ref 0–0.1)
EOS ABS: 0.3 10*3/uL (ref 0–0.7)
EOS PCT: 3 %
HCT: 41.3 % (ref 35.0–47.0)
Hemoglobin: 13.6 g/dL (ref 12.0–16.0)
LYMPHS ABS: 3.2 10*3/uL (ref 1.0–3.6)
Lymphocytes Relative: 38 %
MCH: 27.5 pg (ref 26.0–34.0)
MCHC: 32.9 g/dL (ref 32.0–36.0)
MCV: 83.5 fL (ref 80.0–100.0)
Monocytes Absolute: 1 10*3/uL — ABNORMAL HIGH (ref 0.2–0.9)
Monocytes Relative: 11 %
Neutro Abs: 3.9 10*3/uL (ref 1.4–6.5)
Neutrophils Relative %: 48 %
PLATELETS: 317 10*3/uL (ref 150–440)
RBC: 4.95 MIL/uL (ref 3.80–5.20)
RDW: 14.8 % — ABNORMAL HIGH (ref 11.5–14.5)
WBC: 8.4 10*3/uL (ref 3.6–11.0)

## 2017-01-30 LAB — BASIC METABOLIC PANEL
ANION GAP: 12 (ref 5–15)
BUN: 18 mg/dL (ref 6–20)
CALCIUM: 9 mg/dL (ref 8.9–10.3)
CO2: 22 mmol/L (ref 22–32)
Chloride: 108 mmol/L (ref 101–111)
Creatinine, Ser: 0.85 mg/dL (ref 0.44–1.00)
GFR calc Af Amer: >60 mL/min (ref >60)
GLUCOSE: 110 mg/dL — AB (ref 65–99)
Potassium: 3.3 mmol/L — ABNORMAL LOW (ref 3.5–5.1)
SODIUM: 142 mmol/L (ref 135–145)

## 2017-01-30 LAB — ETHANOL: ALCOHOL ETHYL (B): 182 mg/dL — AB (ref <10)

## 2017-01-30 LAB — BRAIN NATRIURETIC PEPTIDE: B Natriuretic Peptide: 50 pg/mL (ref 0.0–100.0)

## 2017-01-30 LAB — MAGNESIUM: Magnesium: 2 mg/dL (ref 1.7–2.4)

## 2017-01-30 MED ORDER — POTASSIUM CHLORIDE CRYS ER 20 MEQ PO TBCR
40.0000 meq | EXTENDED_RELEASE_TABLET | Freq: Once | ORAL | Status: AC
  Administered 2017-01-30: 40 meq via ORAL
  Filled 2017-01-30: qty 2

## 2017-01-30 NOTE — ED Notes (Signed)
Pt is sleeping in room at this time.

## 2017-01-30 NOTE — ED Provider Notes (Addendum)
Lauren Koch.bj [facility] Emergency Department Provider Note  ____________________________________________   I have reviewed the triage vital signs and the nursing notes. Where available I have reviewed prior notes and, if possible and indicated, outside hospital notes.    HISTORY  Chief Complaint Shortness of Breath    HPI Lauren Koch is a 66 y.o. female who presents today complaining of cough and wheeze that started earlier today.  No fever no chills.  Has history of COPD, EtOH abuse still smokes, was leaving the store and became more wheezy called 911.  Did try her inhaler with minimal results.  Denies chest pain or leg swelling.  No productive cough.   Location:   Radiation: No pain Quality: Miss of breath Duration: Today mostly a little bit worse over the last couple hours Timing: See above Severity: Significant Associated sxs: None PriorTreatment : Her own and EMS albuterol, feeling better after EMS albuterol.   Past Medical History:  Diagnosis Date  . Asthma   . Cataract   . COPD (chronic obstructive pulmonary disease) (HCC)   . GERD (gastroesophageal reflux disease)   . Hypertension   . Smoker     Patient Active Problem List   Diagnosis Date Noted  . Hyperlipemia 03/25/2016  . 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 Osteopenia 08/08/2015  . Pulmonary emphysema (HCC)   . Hyperglycemia 05/30/2015  . Abnormal TSH 05/30/2015  . CAP (community acquired pneumonia) 05/29/2015  . Acute respiratory failure with hypoxia (HCC) 05/29/2015  . COPD exacerbation (HCC) 05/29/2015  . Acute kidney injury (HCC) 05/29/2015  . Morbid obesity (HCC) 05/29/2015  . Tobacco abuse 05/29/2015  . Right leg pain 05/29/2015  . Essential hypertension 05/29/2015  . Vitamin D deficiency 04/17/2015  . Smoker 03/03/2015  . Asthma   . Cataract   . COPD (chronic obstructive pulmonary disease) (HCC)   . Hypertension   .  GERD (gastroesophageal reflux disease)     Past Surgical History:  Procedure Laterality Date  . CESAREAN SECTION  1971/1976  . HERNIA REPAIR  2013  . LOOP RECORDER INSERTION N/A 03/23/2016   Procedure: Loop Recorder Insertion;  Surgeon: Marinus MawGregg W Taylor, MD;  [name];  Service: Cardiovascular;  [name];  . PARTIAL HYMENECTOMY    . TEE WITHOUT CARDIOVERSION N/A 03/23/2016   Procedure: TRANSESOPHAGEAL ECHOCARDIOGRAM (TEE);  Surgeon: Chilton Siiffany [place], MD;  [facility];  Service: Cardiovascular;  [name];    Prior to Admission medications   Medication Sig Start Date End Date Taking? Authorizing Provider  albuterol (PROVENTIL HFA;[name] [name] 03/03/15   Dorena Bodoixon, Mary B, PA-C  alendronate (FOSAMAX) 70 MG tablet Take 1 tablet (70 mg total) by mouth every 7 (seven) days. Take with a full glass of water on an empty stomach. 08/18/15   Allayne Butcherixon, Mary B, PA-C  amLODipine (NORVASC) 5 MG tablet TAKE ONE TABLET BY MOUTH ONCE DAILY 05/20/16   Dorena Bodoixon, Mary B, PA-C  aspirin 325 MG tablet Take 1 tablet (325 mg total) by mouth daily. 03/24/16   Layne BentonBiby, Sharon L, NP  atorvastatin (LIPITOR) 40 MG tablet [name] 03/23/16   Layne BentonBiby, Sharon L, NP  Fluticasone-Salmeterol (ADVAIR DISKUS) 250-50 MCG/DOSE AEPB Inhale 1 puff into the lungs 2 (two) times daily.  03/17/15   Allayne Butcher B, PA-C  hydrochlorothiazide (HYDRODIURIL) 25 MG tablet TAKE ONE TABLET BY MOUTH ONCE DAILY 05/20/16   Allayne Butcher B, PA-C  lisinopril (PRINIVIL,[name] [name] 03/23/16   Layne Benton, NP  omeprazole (PRILOSEC) 20 MG capsule Take 1 capsule (20 mg total) by mouth daily before breakfast. 03/23/16   Layne Benton, NP  potassium chloride SA (K-DUR,KLOR-CON) 20 MEQ tablet Take 1 tablet (20 mEq total) by mouth  daily. 06/03/15   Elliot Cousin, MD  tiotropium (SPIRIVA HANDIHALER) 18 MCG inhalation capsule Place 1 capsule (18 mcg total) into inhaler and inhale daily. 04/17/15   Dorena Bodo, PA-C  Vitamin D, Ergocalciferol, (DRISDOL) 50000 units CAPS capsule Take 1 capsule (50,000 Units total) by mouth every 7 (seven) days. 04/17/15   Dorena Bodo, PA-C    Allergies Prednisone  Family History  Problem Relation Age of Onset  . Diabetes Mother   . Hypertension Mother   . Miscarriages / [place] Mother   . Heart disease Father   . Diabetes Father   . Hypertension Father   . Cancer Sister        skin  . Diabetes Other     Social History Social History   Tobacco Use  . Smoking status: Current Some Day Smoker    Packs/day: 0.50    Years: 30.00    Pack years: 15.00    Types: Cigarettes  . Smokeless tobacco: Former Neurosurgeon    Quit date: 05/29/2015  Substance Use Topics  . Alcohol use: No    Alcohol/week: 0.0 oz  . Drug use: No    Review of Systems Constitutional: No fever/chills Eyes: No visual changes. ENT: No sore throat. No stiff neck no neck pain Cardiovascular: Denies chest pain. Respiratory: + shortness of breath. Gastrointestinal:   no vomiting.  No diarrhea.  No constipation. Genitourinary: Negative for dysuria. Musculoskeletal: Negative lower extremity swelling Skin: Negative for rash. Neurological: Negative for severe headaches, focal weakness or numbness.   ____________________________________________   PHYSICAL EXAM:  VITAL SIGNS: ED Triage Vitals  Enc Vitals Group     BP      Pulse      Resp      Temp      Temp src      SpO2      Weight      Height      Head Circumference      Peak Flow      Pain Score      Pain Loc      Pain Edu?      Excl. in GC?     Constitutional: Alert and oriented.  Chronically ill-appearing, wheezing.  Does not require emergent intubation clinically Eyes: Conjunctivae are normal Head: Atraumatic HEENT: No  congestion/rhinnorhea. Mucous membranes are moist.  Oropharynx non-erythematous Neck:   Nontender with no meningismus, no masses, no stridor Cardiovascular: Normal rate, regular rhythm. Grossly normal heart sounds.  Good peripheral circulation. Respiratory: [place] effort is only increased especially she moves around the bed, she has diffuse mild wheezes but no rales or rhonchi she is moving air,  abdominal: Soft and nontender. No distention. No guarding no rebound Back:  There is no focal tenderness or step off.  there is no midline tenderness there are no lesions noted. there is no CVA tenderness Musculoskeletal: No lower extremity tenderness, no upper extremity tenderness. No joint effusions, no DVT signs strong distal pulses  no edema Neurologic:  Normal speech and language. No gross focal neurologic deficits are appreciated.  Skin:  Skin is warm, dry and intact. No rash noted. Psychiatric: Mood and affect are normal. Speech and behavior are normal.  ____________________________________________   LABS (all labs ordered are listed, but only abnormal results are displayed)  Labs Reviewed  TROPONIN I  CBC WITH DIFFERENTIAL/PLATELET  BRAIN NATRIURETIC PEPTIDE  ETHANOL  BASIC METABOLIC PANEL    Pertinent labs  results that were available during my care of the patient were reviewed by me and considered in my medical decision making (see chart for details). ____________________________________________  EKG  I personally interpreted any EKGs ordered by me or triage Sinus rhythm rate 67 bpm no acute ST elevation no acute ST depression normal axis, nonspecific ST changes noted. ____________________________________________  RADIOLOGY  Pertinent labs & imaging results that were available during my care of the patient were reviewed by me and considered in my medical decision making (see chart for details). If possible, patient and/or family made aware of any abnormal findings.  No results  found. ____________________________________________    PROCEDURES  Procedure(s) performed: None  Procedures  Critical Care performed: None  ____________________________________________   INITIAL IMPRESSION / ASSESSMENT AND PLAN / ED COURSE  Pertinent labs & imaging results that were available during my care of the patient were reviewed by me and considered in my medical decision making (see chart for details).  Patient here with cough and wheeze COPD symptoms mostly wheeze, minimal coughing, no fever, she is improving with albuterol, she declines oral prednisone because she is "allergic".  States it made her sick.  Notes suggest it may have given her a rash which is unlikely in any event, she has had other types of steroids including Decadron in the past and hydrocortisone with no problem and if needed we can try those but it is my hope she gets better with albuterol.  If she does [place] have to have them of course we will give them to her.  We will get extensive workup on this patient actually because of her age and comorbidities and smoking, low suspicion for CHF will get chest x-ray and BNP low suspicion for ACS will get a troponin EKG and reassess.  ----------------------------------------- 8:33 PM on 01/30/2017 -----------------------------------------  Patient resting comfortably still with mild wheeze but no increased work of breathing sats are good, and she is much more comfortable  ----------------------------------------- 8:57 PM on 01/30/2017 -----------------------------------------  Call from pharmacy revealing a potassium "less than 2".  We will recheck this as it was drawn off a line but we will start her on potassium nonetheless EKG shows no acute or worrisome changes yet.  ----------------------------------------- 11:16 PM on 01/30/2017 -----------------------------------------  Potassium is actually normal, borderline low we did give potassium supplementation.   Patient sleeping comfortably, is still mildly intoxicated lungs are clearer, doing well on 2 L, signed out to dr. Zenda AlpersWebster at the end of my shift   ____________________________________________   FINAL CLINICAL IMPRESSION(S) / ED DIAGNOSES  Final diagnoses:  SOB (shortness of breath)      This chart was dictated using voice recognition software.  Despite best efforts to proofread,  errors can occur which can change meaning.      Jeanmarie PlantMcShane, Hulon Ferron A, MD 01/30/17 Brooke [id number]    Jeanmarie PlantMcShane, Jolly Bleicher A, MD 01/30/17 [id number]    Jeanmarie PlantMcShane, Theressa Piedra A, MD 01/30/17 [id number]    Jeanmarie PlantMcShane, Evelean Bigler A, MD 01/30/17 [id number]

## 2017-01-30 NOTE — ED Triage Notes (Signed)
Pt arrives via [facility] with c/o SHOB. Pt was leaving [employer] about 20 minutes and had an asthma attack. Per EMS, pt has received duonebs x 2 en route with wheezing throughout all fields. Pt is on non-rebreather at his time and 100%. Pt has IV access and is in NAD at his time.

## 2017-01-31 MED ORDER — DEXAMETHASONE SODIUM PHOSPHATE 10 MG/ML IJ SOLN
INTRAMUSCULAR | Status: AC
  Filled 2017-01-31: qty 1

## 2017-01-31 MED ORDER — DEXAMETHASONE 10 MG/ML FOR PEDIATRIC ORAL USE
10.0000 mg | Freq: Once | INTRAMUSCULAR | Status: AC
  Administered 2017-01-31: 10 mg via ORAL

## 2017-01-31 NOTE — Discharge Instructions (Signed)
PLease follow up with your primary care physician °

## 2017-01-31 NOTE — ED Notes (Signed)
Pt ambulatory to triage with steady gait.

## 2017-01-31 NOTE — ED Provider Notes (Signed)
-----------------------------------------   4:41 AM on 01/31/2017 -----------------------------------------   Blood pressure (!) 167/106, pulse 77, temperature (!) 96.8 F (36 C), temperature source Axillary, resp. rate 16, height 5\' 1"  (1.549 m), weight 81.6 kg (180 lb), SpO2 100 %.  Assuming care from Dr. Alphonzo LemmingsMcShane.  In short, Lauren Koch is a 66 y.o. female with a chief complaint of Shortness of Breath .  Refer to the original H&P for additional details.  The current plan of care is to reassess the patient's oxygenation status and have the patient sober prior to discharge.  Patient was walked around twice with no decrease in her oxygen saturation and no further development of wheezing.  The patient relaxed and slept in the emergency department for some time.  I went back into check on the patient and she said she feels well.  The patient's daughter is also with her and feels comfortable taking her home.  They do have an inhaler at home.  I will give the patient a dose of Decadron and have her follow-up with her primary care physician.        Rebecka ApleyWebster, Allison P, MD 01/31/17 [phone]

## 2017-01-31 NOTE — ED Notes (Signed)
Pt ambulated to the BR with this RN. Pt ambulatory with steady gait.

## 2017-02-04 ENCOUNTER — Ambulatory Visit: Payer: Medicare Other | Admitting: Family Medicine

## 2017-02-04 ENCOUNTER — Telehealth: Payer: Self-pay

## 2017-02-04 NOTE — Telephone Encounter (Signed)
Copied from CRM [id number]. Topic: Quick Communication - Appointment Cancellation >> Feb 04, 2017  3:10 PM Diana EvesHoyt, Maryann B wrote: Patient called to cancel appointment scheduled for 02/04/17. Patient has rescheduled their appointment.    Route to department's PEC pool.

## 2017-02-04 NOTE — Telephone Encounter (Signed)
Pt cancelled NP appt 02/04/17; note with cancellation stated that cancelled due to out of town death in family. Pt rescheduled for 02/18/17 at 3:45.

## 2017-02-07 NOTE — Telephone Encounter (Signed)
Spoke with Tabitha (DPR) requesting manual transmission to force LINQ programming to sync to network. Manual Transmission received.

## 2017-02-16 ENCOUNTER — Ambulatory Visit (INDEPENDENT_AMBULATORY_CARE_PROVIDER_SITE_OTHER): Payer: Medicare Other | Admitting: *Deleted

## 2017-02-16 DIAGNOSIS — I639 Cerebral infarction, unspecified: Secondary | ICD-10-CM

## 2017-02-17 NOTE — Progress Notes (Signed)
Carelink Summary Report / Loop Recorder 

## 2017-02-18 ENCOUNTER — Ambulatory Visit: Payer: Medicare Other | Admitting: Family Medicine

## 2017-02-25 LAB — CUP PACEART REMOTE DEVICE CHECK
Date Time Interrogation Session: 20190117014110
MDC IDC PG IMPLANT DT: 20180220

## 2017-03-18 ENCOUNTER — Ambulatory Visit (INDEPENDENT_AMBULATORY_CARE_PROVIDER_SITE_OTHER): Payer: Medicare Other | Admitting: *Deleted

## 2017-03-18 DIAGNOSIS — I639 Cerebral infarction, unspecified: Secondary | ICD-10-CM | POA: Diagnosis not present

## 2017-03-21 NOTE — Progress Notes (Signed)
Carelink Summary Report / Loop Recorder 

## 2017-04-12 ENCOUNTER — Emergency Department: Payer: Medicare Other

## 2017-04-12 ENCOUNTER — Encounter: Payer: Self-pay | Admitting: Emergency Medicine

## 2017-04-12 ENCOUNTER — Other Ambulatory Visit: Payer: Self-pay

## 2017-04-12 ENCOUNTER — Inpatient Hospital Stay
Admission: EM | Admit: 2017-04-12 | Discharge: 2017-04-15 | DRG: 189 | Disposition: A | Discharge status: Home Health | Payer: Medicare Other | Attending: Internal Medicine | Admitting: Internal Medicine

## 2017-04-12 DIAGNOSIS — J209 Acute bronchitis, unspecified: Secondary | ICD-10-CM | POA: Diagnosis present

## 2017-04-12 DIAGNOSIS — J9601 Acute respiratory failure with hypoxia: Principal | ICD-10-CM | POA: Diagnosis present

## 2017-04-12 DIAGNOSIS — Z8249 Family history of ischemic heart disease and other diseases of the circulatory system: Secondary | ICD-10-CM | POA: Diagnosis not present

## 2017-04-12 DIAGNOSIS — Z8673 Personal history of transient ischemic attack (TIA), and cerebral infarction without residual deficits: Secondary | ICD-10-CM | POA: Diagnosis not present

## 2017-04-12 DIAGNOSIS — J440 Chronic obstructive pulmonary disease with acute lower respiratory infection: Secondary | ICD-10-CM | POA: Diagnosis present

## 2017-04-12 DIAGNOSIS — Z833 Family history of diabetes mellitus: Secondary | ICD-10-CM

## 2017-04-12 DIAGNOSIS — R05 Cough: Secondary | ICD-10-CM | POA: Diagnosis not present

## 2017-04-12 DIAGNOSIS — J441 Chronic obstructive pulmonary disease with (acute) exacerbation: Secondary | ICD-10-CM | POA: Diagnosis present

## 2017-04-12 DIAGNOSIS — I10 Essential (primary) hypertension: Secondary | ICD-10-CM | POA: Diagnosis present

## 2017-04-12 DIAGNOSIS — K219 Gastro-esophageal reflux disease without esophagitis: Secondary | ICD-10-CM | POA: Diagnosis present

## 2017-04-12 DIAGNOSIS — Z92241 Personal history of systemic steroid therapy: Secondary | ICD-10-CM | POA: Diagnosis not present

## 2017-04-12 DIAGNOSIS — R0602 Shortness of breath: Secondary | ICD-10-CM | POA: Diagnosis not present

## 2017-04-12 DIAGNOSIS — J9621 Acute and chronic respiratory failure with hypoxia: Secondary | ICD-10-CM | POA: Diagnosis not present

## 2017-04-12 DIAGNOSIS — Z23 Encounter for immunization: Secondary | ICD-10-CM | POA: Diagnosis not present

## 2017-04-12 DIAGNOSIS — E785 Hyperlipidemia, unspecified: Secondary | ICD-10-CM | POA: Diagnosis present

## 2017-04-12 DIAGNOSIS — F17210 Nicotine dependence, cigarettes, uncomplicated: Secondary | ICD-10-CM | POA: Diagnosis present

## 2017-04-12 DIAGNOSIS — Z716 Tobacco abuse counseling: Secondary | ICD-10-CM | POA: Diagnosis not present

## 2017-04-12 LAB — INFLUENZA PANEL BY PCR (TYPE A & B)
INFLAPCR: NEGATIVE
INFLBPCR: NEGATIVE

## 2017-04-12 LAB — CBC
HCT: 49 % — ABNORMAL HIGH (ref 35.0–47.0)
Hemoglobin: 16.4 g/dL — ABNORMAL HIGH (ref 12.0–16.0)
MCH: 27.5 pg (ref 26.0–34.0)
MCHC: 33.4 g/dL (ref 32.0–36.0)
MCV: 82.3 fL (ref 80.0–100.0)
PLATELETS: 286 10*3/uL (ref 150–440)
RBC: 5.95 MIL/uL — AB (ref 3.80–5.20)
RDW: 15.4 % — AB (ref 11.5–14.5)
WBC: 8.4 10*3/uL (ref 3.6–11.0)

## 2017-04-12 LAB — BASIC METABOLIC PANEL
Anion gap: 13 (ref 5–15)
BUN: 15 mg/dL (ref 6–20)
CHLORIDE: 100 mmol/L — AB (ref 101–111)
CO2: 24 mmol/L (ref 22–32)
CREATININE: 0.98 mg/dL (ref 0.44–1.00)
Calcium: 9.3 mg/dL (ref 8.9–10.3)
GFR, EST NON AFRICAN AMERICAN: 59 mL/min — AB (ref >60)
Glucose, Bld: 128 mg/dL — ABNORMAL HIGH (ref 65–99)
POTASSIUM: 3.7 mmol/L (ref 3.5–5.1)
SODIUM: 137 mmol/L (ref 135–145)

## 2017-04-12 LAB — TROPONIN I: Troponin I: <0.03 ng/mL (ref <0.03)

## 2017-04-12 MED ORDER — BUDESONIDE 0.5 MG/2ML IN SUSP
0.5000 mg | Freq: Two times a day (BID) | RESPIRATORY_TRACT | Status: DC
  Administered 2017-04-12 – 2017-04-15 (×6): 0.5 mg via RESPIRATORY_TRACT
  Filled 2017-04-12 (×7): qty 2

## 2017-04-12 MED ORDER — IPRATROPIUM-ALBUTEROL 0.5-2.5 (3) MG/3ML IN SOLN
3.0000 mL | Freq: Four times a day (QID) | RESPIRATORY_TRACT | Status: DC
  Administered 2017-04-12 – 2017-04-15 (×11): 3 mL via RESPIRATORY_TRACT
  Filled 2017-04-12 (×11): qty 3

## 2017-04-12 MED ORDER — IOPAMIDOL (ISOVUE-370) INJECTION 76%
75.0000 mL | Freq: Once | INTRAVENOUS | Status: AC | PRN
  Administered 2017-04-12: 75 mL via INTRAVENOUS

## 2017-04-12 MED ORDER — METHYLPREDNISOLONE SODIUM SUCC 125 MG IJ SOLR
125.0000 mg | Freq: Once | INTRAMUSCULAR | Status: AC
  Administered 2017-04-12: 125 mg via INTRAVENOUS
  Filled 2017-04-12: qty 2

## 2017-04-12 MED ORDER — ASPIRIN EC 81 MG PO TBEC
81.0000 mg | DELAYED_RELEASE_TABLET | Freq: Every day | ORAL | Status: DC
  Administered 2017-04-12 – 2017-04-15 (×4): 81 mg via ORAL
  Filled 2017-04-12 (×4): qty 1

## 2017-04-12 MED ORDER — IPRATROPIUM-ALBUTEROL 0.5-2.5 (3) MG/3ML IN SOLN
3.0000 mL | Freq: Once | RESPIRATORY_TRACT | Status: AC
  Administered 2017-04-12: 3 mL via RESPIRATORY_TRACT
  Filled 2017-04-12: qty 3

## 2017-04-12 MED ORDER — HYDROCHLOROTHIAZIDE 25 MG PO TABS
25.0000 mg | ORAL_TABLET | Freq: Every day | ORAL | Status: DC
  Filled 2017-04-12: qty 1

## 2017-04-12 MED ORDER — LISINOPRIL 20 MG PO TABS
20.0000 mg | ORAL_TABLET | Freq: Every day | ORAL | Status: DC
  Filled 2017-04-12: qty 1

## 2017-04-12 MED ORDER — METHYLPREDNISOLONE SODIUM SUCC 40 MG IJ SOLR
40.0000 mg | Freq: Two times a day (BID) | INTRAMUSCULAR | Status: DC
  Administered 2017-04-12 – 2017-04-15 (×6): 40 mg via INTRAVENOUS
  Filled 2017-04-12 (×6): qty 1

## 2017-04-12 MED ORDER — ATORVASTATIN CALCIUM 20 MG PO TABS
40.0000 mg | ORAL_TABLET | Freq: Every day | ORAL | Status: DC
  Administered 2017-04-12 – 2017-04-14 (×3): 40 mg via ORAL
  Filled 2017-04-12 (×3): qty 2

## 2017-04-12 MED ORDER — TRAMADOL HCL 50 MG PO TABS
50.0000 mg | ORAL_TABLET | Freq: Once | ORAL | Status: AC
  Administered 2017-04-12: 50 mg via ORAL
  Filled 2017-04-12: qty 1

## 2017-04-12 MED ORDER — ONDANSETRON HCL 4 MG/2ML IJ SOLN: 4.0000 mg | Freq: Four times a day (QID) | INTRAMUSCULAR | Status: DC | PRN

## 2017-04-12 MED ORDER — INFLUENZA VAC SPLIT HIGH-DOSE 0.5 ML IM SUSY
0.5000 mL | PREFILLED_SYRINGE | INTRAMUSCULAR | Status: AC
  Administered 2017-04-15: 0.5 mL via INTRAMUSCULAR
  Filled 2017-04-12 (×2): qty 0.5

## 2017-04-12 MED ORDER — OXYCODONE-ACETAMINOPHEN 5-325 MG PO TABS
1.0000 | ORAL_TABLET | Freq: Four times a day (QID) | ORAL | Status: DC | PRN
  Administered 2017-04-12: 1 via ORAL
  Filled 2017-04-12: qty 1

## 2017-04-12 MED ORDER — ENOXAPARIN SODIUM 40 MG/0.4ML ~~LOC~~ SOLN
40.0000 mg | SUBCUTANEOUS | Status: DC
  Administered 2017-04-12 – 2017-04-14 (×3): 40 mg via SUBCUTANEOUS
  Filled 2017-04-12 (×3): qty 0.4

## 2017-04-12 MED ORDER — ACETAMINOPHEN 650 MG RE SUPP: 650.0000 mg | Freq: Four times a day (QID) | RECTAL | Status: DC | PRN

## 2017-04-12 MED ORDER — PNEUMOCOCCAL VAC POLYVALENT 25 MCG/0.5ML IJ INJ
0.5000 mL | INJECTION | INTRAMUSCULAR | Status: AC
  Administered 2017-04-15: 0.5 mL via INTRAMUSCULAR
  Filled 2017-04-12: qty 0.5

## 2017-04-12 MED ORDER — ACETAMINOPHEN 325 MG PO TABS: 650.0000 mg | ORAL_TABLET | Freq: Four times a day (QID) | ORAL | Status: DC | PRN

## 2017-04-12 MED ORDER — ALBUTEROL SULFATE (2.5 MG/3ML) 0.083% IN NEBU
5.0000 mg | INHALATION_SOLUTION | Freq: Once | RESPIRATORY_TRACT | Status: AC
  Administered 2017-04-12: 5 mg via RESPIRATORY_TRACT
  Filled 2017-04-12: qty 6

## 2017-04-12 MED ORDER — ONDANSETRON HCL 4 MG PO TABS: 4.0000 mg | ORAL_TABLET | Freq: Four times a day (QID) | ORAL | Status: DC | PRN

## 2017-04-12 NOTE — ED Triage Notes (Signed)
Pt in via POV with complaints of increasing shortness of breath with cough x 2 days.  Pt also complains of severe headache.  Pt 88% on room air, placed on 2L nasal cannula at this time.  Pt with hx of COPD.

## 2017-04-12 NOTE — ED Notes (Signed)
Pt placed in treatment gown. Bra and shirt placed in a patient's belongings bag at bedside with family member.

## 2017-04-12 NOTE — ED Notes (Signed)
MD made aware that pt is currently on 4L [place] and remains to have oxygen saturations in the 80s.

## 2017-04-12 NOTE — Progress Notes (Signed)
Patient ID: Lauren Koch, female   DOB: Feb 05, 1950, 67 y.o.   MRN: [id number]  ACP discussion.  Diagnosis: acute hypoxic respiratory failure, COPD exacerbation, hypertension, history of stroke.  CODE STATUS discussed at length.  Patient would like to be a full code at this point.  I also discussed the patient's hypoxia.  Hopefully we will be able to get the patient off oxygen prior to disposition.  We will try to get better air entry by giving steroids nebulizer treatments.  Prior to going home the patient will need an ambulatory pulse ox on room air to see if she still qualifies for oxygen at home.    Time spent on ACP discussion 17 minutes.  Dr. Alford Highlandichard Dawan Farney

## 2017-04-12 NOTE — ED Notes (Signed)
Pt left room for CXR and returns reporting pain in bilateral rib cage. Pt is tearful and reports pain increases with movement. MD made aware.

## 2017-04-12 NOTE — ED Provider Notes (Signed)
[facility] Emergency Department Provider Note    None    (approximate)  I have reviewed the triage vital signs and the nursing notes.   HISTORY  Chief Complaint Shortness of Breath    HPI Lauren Koch is a 67 y.o. female with a history of COPD hypertension as well as heavy smoker presents with chief complaint of shortness of breath that is worsened over the past day but initially started 2 days ago.  No recent antibiotics.  Denies any of any fevers or productive cough.  Did smoke before coming into the ER.  Is complaining of a mild to moderate headache.  Does not wear oxygen chronically.  Does have a oxygen as an trader at home which she has been using without any improvement.  Has been taking nebulizers at home without any improvement.  Presents to the ER to Neck and hypoxic on 2 L nasal cannula.  Past Medical History:  Diagnosis Date  . Asthma   . Cataract   . COPD (chronic obstructive pulmonary disease) (HCC)   . GERD (gastroesophageal reflux disease)   . Hypertension   . Smoker    Family History  Problem Relation Age of Onset  . Diabetes Mother   . Hypertension Mother   . Miscarriages / [place]   . Heart disease Father   . Diabetes Father   . Hypertension Father   . Cancer Sister        skin  . Diabetes Other    Past Surgical History:  Procedure Laterality Date  . CESAREAN SECTION  1971/1976  . HERNIA REPAIR  2013  . LOOP RECORDER INSERTION N/A 03/23/2016   Procedure: Loop Recorder Insertion;  Surgeon: Marinus MawGregg W Taylor, MD;  [name];  Service: Cardiovascular;  [name];  . PARTIAL HYMENECTOMY    . TEE WITHOUT CARDIOVERSION N/A 03/23/2016   Procedure: TRANSESOPHAGEAL ECHOCARDIOGRAM (TEE);  Surgeon: Chilton Siiffany Woodbury, MD;  [facility];  Service: Cardiovascular;  [name];   Patient Active Problem List   Diagnosis Date Noted  . Hyperlipemia 03/25/2016  . Cryptogenic stroke (HCC) - L  anterior cirulation and R pontine s/p IV tPA 03/20/2016  . Osteopenia 08/08/2015  . Pulmonary emphysema (HCC)   . Hyperglycemia 05/30/2015  . Abnormal TSH 05/30/2015  . CAP (community acquired pneumonia) 05/29/2015  . Acute respiratory failure with hypoxia (HCC) 05/29/2015  . COPD exacerbation (HCC) 05/29/2015  . Acute kidney injury (HCC) 05/29/2015  . Morbid obesity (HCC) 05/29/2015  . Tobacco abuse 05/29/2015  . Right leg pain 05/29/2015  . Essential hypertension 05/29/2015  . Vitamin D deficiency 04/17/2015  . Smoker 03/03/2015  . Asthma   . Cataract   . COPD (chronic obstructive pulmonary disease) (HCC)   . Hypertension   . GERD (gastroesophageal reflux disease)       Prior to Admission medications   Medication Sig Start Date End Date Taking? Authorizing Provider  albuterol (PROVENTIL HFA;[name] [name] 03/03/15  Yes Allayne Butcherixon, Mary B, PA-C  hydrochlorothiazide (HYDRODIURIL) 25 MG tablet TAKE ONE TABLET BY MOUTH ONCE DAILY 05/20/16  Yes Allayne Butcherixon, Mary B, PA-C  lisinopril (PRINIVIL,[name] [name] 03/23/16  Yes Layne BentonBiby, Sharon L, NP    Allergies Prednisone    Social History Social History   Tobacco Use  . Smoking  status: Current Some Day Smoker    Packs/day: 0.50    Years: 30.00    Pack years: 15.00    Types: Cigarettes  . Smokeless tobacco: Former Neurosurgeon    Quit date: 05/29/2015  Substance Use Topics  . Alcohol use: Yes    Alcohol/week: 0.0 oz    Comment: occasionally  . Drug use: No    Review of Systems Patient denies headaches, rhinorrhea, blurry vision, numbness, shortness of breath, chest pain, edema, cough, abdominal pain, nausea, vomiting, diarrhea, dysuria, fevers, rashes or hallucinations unless otherwise stated above in HPI. ____________________________________________   PHYSICAL EXAM:  VITAL  SIGNS: Vitals:   04/12/17 1507 04/12/17 1518  BP: (!) 170/83 (!) 141/82  Pulse: 100 (!) 102  Resp: (!) 21   Temp: 98 F (36.7 C)   SpO2: 91%     Constitutional: Alert and oriented. Well appearing and in no acute distress. Eyes: Conjunctivae are normal.  Head: Atraumatic. Nose: No congestion/rhinnorhea. Mouth/Throat: Mucous membranes are moist.   Neck: No stridor. Painless ROM.  Cardiovascular: Normal rate, regular rhythm. Grossly normal heart sounds.  Good peripheral circulation. Respiratory: tachypnea, with prolonged expiratory phase, speaking in short phrases, + use of accessory muscles Gastrointestinal: Soft and nontender. No distention. No abdominal bruits. No CVA tenderness. Genitourinary:  Musculoskeletal: No lower extremity tenderness nor edema.  No joint effusions. Neurologic:  Normal speech and language. No gross focal neurologic deficits are appreciated. No facial droop Skin:  Skin is warm, dry and intact. No rash noted. Psychiatric: Mood and affect are normal. Speech and behavior are normal.  ____________________________________________   LABS (all labs ordered are listed, but only abnormal results are displayed)  Results for orders placed or performed during the hospital encounter of 04/12/17 (from the past 24 hour(s))  Basic metabolic panel     Status: Abnormal   Collection Time: 04/12/17 11:40 AM  Result Value Ref Range   Sodium 137 135 - 145 mmol/L   Potassium 3.7 3.5 - 5.1 mmol/L   Chloride 100 (L) 101 - 111 mmol/L   CO2 24 22 - 32 mmol/L   Glucose, Bld 128 (H) 65 - 99 mg/dL   BUN 15 6 - 20 mg/dL   Creatinine, Ser 2.95 0.44 - 1.00 mg/dL   Calcium 9.3 8.9 - 62.1 mg/dL   GFR calc non Af Amer 59 (L) >60 mL/min   GFR calc Af Amer >60 >60 mL/min   Anion gap 13 5 - 15  CBC     Status: Abnormal   Collection Time: 04/12/17 11:40 AM  Result Value Ref Range   WBC 8.4 3.6 - 11.0 K/uL   RBC 5.95 (H) 3.80 - 5.20 MIL/uL   Hemoglobin 16.4 (H) 12.0 - 16.0 g/dL    HCT 30.8 (H) 65.7 - 47.0 %   MCV 82.3 80.0 - 100.0 fL   MCH 27.5 26.0 - 34.0 pg   MCHC 33.4 32.0 - 36.0 g/dL   RDW 84.6 (H) 96.2 - 95.2 %   Platelets 286 150 - 440 K/uL  Troponin I     Status: None   Collection Time: 04/12/17 11:40 AM  Result Value Ref Range   Troponin I <0.03 <0.03 ng/mL  Influenza panel by PCR (type A & B)     Status: None   Collection Time: 04/12/17  1:44 PM  Result Value Ref Range   Influenza A By PCR NEGATIVE NEGATIVE   Influenza B By PCR NEGATIVE NEGATIVE   ____________________________________________  EKG My review and personal  interpretation at Time: 11:14   Indication: sob  Rate: 80  Rhythm: sinus Axis: normal Other: normal intervals, no stemi,  ____________________________________________  RADIOLOGY  I personally reviewed all radiographic images ordered to evaluate for the above acute complaints and reviewed radiology reports and findings.  These findings were personally discussed with the patient.  Please see medical record for radiology report.  ____________________________________________   PROCEDURES  Procedure(s) performed:  .Critical Care Performed by: Willy Eddy, MD Authorized by: Willy Eddy, MD   Critical care provider statement:    Critical care time (minutes):  35   Critical care time was exclusive of:  Separately billable procedures and treating other patients   Critical care was necessary to treat or prevent imminent or life-threatening deterioration of the following conditions:  Respiratory failure   Critical care was time spent personally by me on the following activities:  Development of treatment plan with patient or surrogate, discussions with consultants, evaluation of patient's response to treatment, examination of patient, obtaining history from patient or surrogate, ordering and performing treatments and interventions, ordering and review of laboratory studies, ordering and review of radiographic studies, pulse  oximetry, re-evaluation of patient's condition and review of old charts       Critical Care performed: yes  ____________________________________________   INITIAL IMPRESSION / ASSESSMENT AND PLAN / ED COURSE  Pertinent labs & imaging results that were available during my care of the patient were reviewed by me and considered in my medical decision making (see chart for details).  DDX: Asthma, copd, CHF, pna, ptx, malignancy, Pe, anemia   VITA CURRIN is a 67 y.o. who presents to the ED with symptoms as described above.  Patient presenting with acute hypoxic respiratory failure.  Patient given nebulizer treatments with some improvement and given her underlying COPD was given bolus steroids.  Due to development of right sided pleuritic chest pain CT angiogram was ordered as she was not having significant improvement after nebulizer treatment due to concern for PE.  CT angiogram shows no evidence of pulmonary embolism.  Not clinically consistent with worsening heart failure or ACS.  No evidence of pneumonia.  Based on her acute respiratory failure with hypoxia requiring multiple nebulizers IV steroids and additional hemodynamic monitoring do feel patient will require admission to the hospital for further medical management.  Clinical Course as of Apr 13 1526  Tue Apr 12, 2017  1305 Basic metabolic panel [PR]    Clinical Course User Index [PR] Willy Eddy, MD     ____________________________________________   FINAL CLINICAL IMPRESSION(S) / ED DIAGNOSES  Final diagnoses:  Acute on chronic respiratory failure with hypoxia (HCC)  COPD exacerbation (HCC)      NEW MEDICATIONS STARTED DURING THIS VISIT:  Current Discharge Medication List       Note:  This document was prepared using Dragon voice recognition software and may include unintentional dictation errors.    Willy Eddy, MD 04/12/17 [phone]

## 2017-04-12 NOTE — H&P (Addendum)
[facility] [place] [facility]   PATIENT NAME: Lauren Koch    MR#:  [id number]  DATE OF BIRTH:  [birth year]  DATE OF ADMISSION:  04/12/2017  PRIMARY CARE PHYSICIAN: Pccm, Raymond Gurney, MD   REQUESTING/REFERRING PHYSICIAN: Dr Willy Eddy  CHIEF COMPLAINT:   Chief Complaint  Patient presents with  . Shortness of Breath    HISTORY OF PRESENT ILLNESS:  Lauren Koch  is a 67 y.o. female with a known history of COPD presents with shortness of breath.  She states her breathing has been poor for the last couple days.  She cannot catch her breath.  She is coughing but nonproductive.  She has a wheeze.  No complaints of chest pain.  In the ER, she was found to have a pulse ox of 88% on room air.  Hospitalist services were contacted for further evaluation.  Patient received numerous rounds of nebulizers in the ED without improvement.  CT scan of the chest was negative for pulmonary embolism or pneumonia.  PAST MEDICAL HISTORY:   Past Medical History:  Diagnosis Date  . Asthma   . Cataract   . COPD (chronic obstructive pulmonary disease) (HCC)   . GERD (gastroesophageal reflux disease)   . Hypertension   . Smoker     PAST SURGICAL HISTORY:   Past Surgical History:  Procedure Laterality Date  . CESAREAN SECTION  1971/1976  . HERNIA REPAIR  2013  . LOOP RECORDER INSERTION N/A 03/23/2016   Procedure: Loop Recorder Insertion;  [name], MD;  [name];  Service: Cardiovascular;  [name];  . PARTIAL HYMENECTOMY    . TEE WITHOUT CARDIOVERSION N/A 03/23/2016   Procedure: TRANSESOPHAGEAL ECHOCARDIOGRAM (TEE);  [name], MD;  Location: [facility] ENDOSCOPY;  Service: Cardiovascular;  [name];    SOCIAL HISTORY:   Social History   Tobacco Use  . Smoking status: Current Some Day Smoker    Packs/day: 0.50    Years: 30.00    Pack years: 15.00    Types: Cigarettes  . Smokeless tobacco: Former  Neurosurgeon    Quit date: 05/29/2015  Substance Use Topics  . Alcohol use: Yes    Alcohol/week: 0.0 oz    Comment: occasionally    FAMILY HISTORY:   Family History  Problem Relation Age of Onset  . Diabetes Mother   . Hypertension Mother   . Miscarriages / [place] Mother   . Heart disease Father   . Diabetes Father   . Hypertension Father   . Cancer Sister        skin  . Diabetes Other     DRUG ALLERGIES:   Allergies  Allergen Reactions  . Prednisone Rash and Other (See Comments)    Arrhythmia, also (Has tolerated hydrocortisone and decadron)     REVIEW OF SYSTEMS:  CONSTITUTIONAL: No fever, fatigue or weakness.  EYES: No blurred or double vision.  Wears glasses EARS, NOSE, AND THROAT: No tinnitus or ear pain. No sore throat RESPIRATORY: Positive for cough, shortness of breath, and wheezing.  No medication reconciliation still undergoing hemoptysis.  CARDIOVASCULAR: No chest pain, orthopnea, edema.  GASTROINTESTINAL: No nausea, vomiting, diarrhea or abdominal pain. No blood in bowel movements GENITOURINARY: No dysuria, hematuria.  ENDOCRINE: No polyuria, nocturia,  HEMATOLOGY: No anemia, easy bruising or bleeding SKIN: No rash or lesion. MUSCULOSKELETAL: No joint pain or arthritis.   NEUROLOGIC: No tingling, numbness, weakness.  PSYCHIATRY: No anxiety or depression.   MEDICATIONS AT HOME:  Prior to Admission medications   Medication Sig Start Date End Date Taking? Authorizing Provider  albuterol (PROVENTIL HFA;[name] [name] 03/03/15   Dorena Bodoixon, Mary B, PA-C  alendronate (FOSAMAX) 70 MG tablet Take 1 tablet (70 mg total) by mouth every 7 (seven) days. Take with a full glass of water on an empty stomach. 08/18/15   Allayne Butcherixon, Mary B, PA-C  amLODipine (NORVASC) 5 MG tablet TAKE ONE TABLET BY MOUTH ONCE DAILY 05/20/16   Dorena Bodoixon, Mary B, PA-C  aspirin 325 MG  tablet Take 1 tablet (325 mg total) by mouth daily. 03/24/16   Layne BentonBiby, Sharon L, NP  atorvastatin (LIPITOR) 40 MG tablet [name] 03/23/16   Layne BentonBiby, Sharon L, NP  Fluticasone-Salmeterol (ADVAIR DISKUS) 250-50 MCG/DOSE AEPB Inhale 1 puff into the lungs 2 (two) times daily. 03/17/15   Allayne Butcherixon, Mary B, PA-C  hydrochlorothiazide (HYDRODIURIL) 25 MG tablet TAKE ONE TABLET BY MOUTH ONCE DAILY 05/20/16   Allayne Butcherixon, Mary B, PA-C  lisinopril (PRINIVIL,[name] [name] 03/23/16   Layne BentonBiby, Sharon L, NP  omeprazole (PRILOSEC) 20 MG capsule Take 1 capsule (20 mg total) by mouth daily before breakfast. 03/23/16   Layne BentonBiby, Sharon L, NP  potassium chloride SA (K-DUR,KLOR-CON) 20 MEQ tablet Take 1 tablet (20 mEq total) by mouth daily. 06/03/15   Elliot CousinFisher, Denise, MD  tiotropium (SPIRIVA HANDIHALER) 18 MCG inhalation capsule Place 1 capsule (18 mcg total) into inhaler and inhale daily. 04/17/15   Dorena Bodoixon, Mary B, PA-C  Vitamin D, Ergocalciferol, (DRISDOL) 50000 units CAPS capsule Take 1 capsule (50,000 Units total) by mouth every 7 (seven) days. 04/17/15   Dorena Bodoixon, Mary B, PA-C   Medication reconciliation process still undergoing  VITAL SIGNS:  Blood pressure (!) 142/96, pulse (!) 105, temperature 97.6 F (36.4 C), temperature source Oral, resp. rate (!) 22, height 5\' 1"  (1.549 m), weight 88 kg (194 lb), SpO2 (!) 88 %.  PHYSICAL EXAMINATION:  GENERAL:  67 y.o.-year-old patient lying in the bed with no acute distress.  EYES: Pupils equal, round, reactive to light and accommodation. No scleral icterus. Extraocular muscles intact.  HEENT: Head atraumatic, normocephalic. Oropharynx and nasopharynx clear.  NECK:  Supple, no jugular venous distention. No thyroid enlargement, no tenderness.  LUNGS: Decreased breath sounds bilaterally, poor air entry bilaterally,  positive for inspiratory and expiratory wheezing.  No rales,rhonchi or crepitation. No use of accessory  muscles of respiration.  CARDIOVASCULAR: S1, S2 normal. No murmurs, rubs, or gallops.  ABDOMEN: Soft, nontender, nondistended. Bowel sounds present. No organomegaly or mass.  EXTREMITIES: Trace edema.  No cyanosis, or clubbing.  NEUROLOGIC: Cranial nerves II through XII are intact. Muscle strength 5/5 in all extremities. Sensation intact. Gait not checked.  PSYCHIATRIC: The patient is alert and oriented x 3.  SKIN: No rash, lesion, or ulcer.   LABORATORY PANEL:   CBC Recent Labs  Lab 04/12/17 1140  WBC 8.4  HGB 16.4*  HCT 49.0*  PLT 286   ------------------------------------------------------------------------------------------------------------------  Chemistries  Recent Labs  Lab 04/12/17 1140  NA 137  K 3.7  CL 100*  CO2 24  GLUCOSE 128*  BUN 15  CREATININE 0.98  CALCIUM 9.3   ------------------------------------------------------------------------------------------------------------------  Cardiac Enzymes Recent Labs  Lab 04/12/17 1140  TROPONINI <0.03   ------------------------------------------------------------------------------------------------------------------  RADIOLOGY:  Dg Chest  2 View  Result Date: 04/12/2017 CLINICAL DATA:  Shortness of breath.  Cough. EXAM: CHEST - 2 VIEW COMPARISON:  01/30/2017 and 05/29/2015 and CT scan dated 05/29/2015 FINDINGS: Heart size is normal. Right-sided aortic arch. The descending thoracic aorta is on the right as well. Pulmonary vascularity is normal. Slight scarring at the right lung base. Lungs are otherwise clear. No effusions. No acute bone abnormality. IMPRESSION: No acute abnormalities. Electronically Signed   By: Francene Boyers M.D.   On: 04/12/2017 12:27   Ct Angio Chest Pe W And/or Wo Contrast  Result Date: 04/12/2017 CLINICAL DATA:  Worsening shortness of breath and cough over the last 2 days. Headache. EXAM: CT ANGIOGRAPHY CHEST WITH CONTRAST TECHNIQUE: Multidetector CT imaging of the chest was performed  using the standard protocol during bolus administration of intravenous contrast. Multiplanar CT image reconstructions and MIPs were obtained to evaluate the vascular anatomy. CONTRAST:  75mL ISOVUE-370 IOPAMIDOL (ISOVUE-370) INJECTION 76% COMPARISON:  Chest radiography same day.  CT 05/29/2015. FINDINGS: Cardiovascular: Pulmonary arterial opacification is good. There are no pulmonary emboli. Minimal aortic atherosclerosis. Minimal coronary artery calcification. 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 These can be associated with dysphagia. Rarely, these can suffer from rupture or dissection. If the patient does not have a relationship with a vascular surgeon, that might be worth establishing for follow-up. Mediastinum/Nodes: No mass or adenopathy. Normal mediastinal nodes. Small hiatal hernia. Lungs/Pleura: Lungs are clear. No infiltrate or collapse. No significant focal finding. No pleural fluid. Upper Abdomen: Negative Musculoskeletal: Negative Review of the MIP images confirms the above findings. IMPRESSION: No pulmonary emboli or other acute chest pathology. 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 Mild aortic atherosclerosis and coronary artery calcification. See above discussion. Electronically Signed   By: Paulina Fusi M.D.   On: 04/12/2017 13:29    EKG:   Normal sinus rhythm 81 bpm.  No acute ST-T wave changes  IMPRESSION AND PLAN:   1.  Acute hypoxic respiratory failure with pulse ox of 88% on room air.  Continue oxygen supplementation to maintain pulse ox above 88%.  Check pulse ox on a daily basis.  Prior to going home will need ambulatory pulse ox on room air to see if she is a candidate for oxygen at home.  Hopefully we can get her off oxygen  prior to discharge home. 2.  COPD exacerbation.  Start Solu-Medrol 40 mg IV every 12 hours.  Patient received Solu-Medrol and 25 mg IV once.  DuoNeb nebulizer solution and budesonide nebulizers. 3.  Essential hypertension.  Continue her usual medications 4.  Hyperlipidemia unspecified restart atorvastatin 5.  Tobacco abuse.  Smoking cessation counseling done 4 minutes by me.  Nicotine patch refused. 6.  History of stroke in the past restart aspirin and atorvastatin.  I have personally reviewed the patient's laboratory data, EKG, chest x-ray and CT scan  Management plans discussed with the patient, family and they are in agreement.  CODE STATUS: Full Code  TOTAL TIME TAKING CARE OF THIS PATIENT: 55 minutes, including ACP time   Alford Highland M.D on 04/12/2017 at 2:36 PM  Between 7am to 6pm - Pager - [phone]  After 6pm call admission pager [phone]  [facility] Office  [phone]  CC: Primary care physician; Pccm, Raymond Gurney, MD

## 2017-04-13 LAB — BASIC METABOLIC PANEL
ANION GAP: 13 (ref 5–15)
BUN: 28 mg/dL — AB (ref 6–20)
CO2: 21 mmol/L — ABNORMAL LOW (ref 22–32)
Calcium: 9.5 mg/dL (ref 8.9–10.3)
Chloride: 101 mmol/L (ref 101–111)
Creatinine, Ser: 1.1 mg/dL — ABNORMAL HIGH (ref 0.44–1.00)
GFR calc Af Amer: 59 mL/min — ABNORMAL LOW (ref >60)
GFR, EST NON AFRICAN AMERICAN: 51 mL/min — AB (ref >60)
GLUCOSE: 179 mg/dL — AB (ref 65–99)
POTASSIUM: 3.5 mmol/L (ref 3.5–5.1)
SODIUM: 135 mmol/L (ref 135–145)

## 2017-04-13 LAB — CBC
HEMATOCRIT: 41.3 % (ref 35.0–47.0)
HEMOGLOBIN: 14.1 g/dL (ref 12.0–16.0)
MCH: 27.7 pg (ref 26.0–34.0)
MCHC: 34 g/dL (ref 32.0–36.0)
MCV: 81.4 fL (ref 80.0–100.0)
Platelets: 283 10*3/uL (ref 150–440)
RBC: 5.08 MIL/uL (ref 3.80–5.20)
RDW: 15.3 % — AB (ref 11.5–14.5)
WBC: 9.6 10*3/uL (ref 3.6–11.0)

## 2017-04-13 MED ORDER — LISINOPRIL 20 MG PO TABS
20.0000 mg | ORAL_TABLET | Freq: Every day | ORAL | Status: DC
  Administered 2017-04-14 – 2017-04-15 (×2): 20 mg via ORAL
  Filled 2017-04-13 (×2): qty 1

## 2017-04-13 MED ORDER — HYDROCHLOROTHIAZIDE 25 MG PO TABS
25.0000 mg | ORAL_TABLET | Freq: Once | ORAL | Status: AC
  Administered 2017-04-13: 25 mg via ORAL
  Filled 2017-04-13: qty 1

## 2017-04-13 MED ORDER — LISINOPRIL 20 MG PO TABS
20.0000 mg | ORAL_TABLET | Freq: Once | ORAL | Status: AC
Start: 2017-04-13 — End: 2017-04-13
  Administered 2017-04-13: 20 mg via ORAL
  Filled 2017-04-13: qty 1

## 2017-04-13 MED ORDER — HYDROCHLOROTHIAZIDE 25 MG PO TABS
25.0000 mg | ORAL_TABLET | Freq: Every day | ORAL | Status: DC
  Administered 2017-04-14 – 2017-04-15 (×2): 25 mg via ORAL
  Filled 2017-04-13 (×2): qty 1

## 2017-04-13 MED ORDER — FAMOTIDINE 20 MG PO TABS
20.0000 mg | ORAL_TABLET | Freq: Two times a day (BID) | ORAL | Status: DC
  Administered 2017-04-13 – 2017-04-15 (×6): 20 mg via ORAL
  Filled 2017-04-13 (×6): qty 1

## 2017-04-13 MED ORDER — DOXYCYCLINE HYCLATE 100 MG PO TABS
100.0000 mg | ORAL_TABLET | Freq: Two times a day (BID) | ORAL | Status: DC
  Administered 2017-04-13 – 2017-04-15 (×5): 100 mg via ORAL
  Filled 2017-04-13 (×5): qty 1

## 2017-04-13 NOTE — Progress Notes (Signed)
Notified Dr. Sheryle Hailiamond of patient complaining of indigestion. New order put in at this time.

## 2017-04-13 NOTE — Progress Notes (Signed)
[employer] [facility] - [place] at [facility]   PATIENT NAME: Lauren Koch PatientShelia Koch    MR#:  [id number]  DATE OF BIRTH:  10/15/1950  SUBJECTIVE:   Doing better then yesterday  REVIEW OF SYSTEMS:    Review of Systems  Constitutional: Negative for fever, chills weight loss HENT: Negative for ear pain, nosebleeds, congestion, facial swelling, rhinorrhea, neck pain, neck stiffness and ear discharge.   Respiratory:  ++for cough, shortness of breath, wheezing  Cardiovascular: Negative for chest pain, palpitations and leg swelling.  Gastrointestinal: Negative for heartburn, abdominal pain, vomiting, diarrhea or consitpation Genitourinary: Negative for dysuria, urgency, frequency, hematuria Musculoskeletal: Negative for back pain or joint pain Neurological: Negative for dizziness, seizures, syncope, focal weakness,  numbness and headaches.  Hematological: Does not bruise/bleed easily.  Psychiatric/Behavioral: Negative for hallucinations, confusion, dysphoric mood    Tolerating Diet: yes      DRUG ALLERGIES:   Allergies  Allergen Reactions  . Prednisone Rash and Other (See Comments)    Arrhythmia, also (Has tolerated hydrocortisone and decadron)     VITALS:  Blood pressure 118/64, pulse 75, temperature 97.8 F (36.6 C), temperature source Oral, resp. rate 16, height 5\' 1"  (1.549 m), weight 88 kg (194 lb), SpO2 92 %.  PHYSICAL EXAMINATION:  Constitutional: Appears well-developed and well-nourished. No distress. HENT: Normocephalic. Marland Kitchen. Oropharynx is clear and moist.  Eyes: Conjunctivae and EOM are normal. PERRLA, no scleral icterus.  Neck: Normal ROM. Neck supple. No JVD. No tracheal deviation. CVS: RRR, S1/S2 +, no murmurs, no gallops, no carotid bruit.  Pulmonary: Normal respiratory effort with bilateral expiratory prolonged wheezing  Abdominal: Soft. BS +,  no distension, tenderness, rebound or guarding.  Musculoskeletal: Normal range of motion. No edema and no tenderness.   Neuro: Alert. CN 2-12 grossly intact. No focal deficits. Skin: Skin is warm and dry. No rash noted. Psychiatric: Normal mood and affect.      LABORATORY PANEL:   CBC Recent Labs  Lab 04/13/17 0407  WBC 9.6  HGB 14.1  HCT 41.3  PLT 283   ------------------------------------------------------------------------------------------------------------------  Chemistries  Recent Labs  Lab 04/13/17 0407  NA 135  K 3.5  CL 101  CO2 21*  GLUCOSE 179*  BUN 28*  CREATININE 1.10*  CALCIUM 9.5   ------------------------------------------------------------------------------------------------------------------  Cardiac Enzymes Recent Labs  Lab 04/12/17 1140  TROPONINI <0.03   ------------------------------------------------------------------------------------------------------------------  RADIOLOGY:  Dg Chest 2 View  Result Date: 04/12/2017 CLINICAL DATA:  Shortness of breath.  Cough. EXAM: CHEST - 2 VIEW COMPARISON:  01/30/2017 and 05/29/2015 and CT scan dated 05/29/2015 FINDINGS: Heart size is normal. Right-sided aortic arch. The descending thoracic aorta is on the right as well. Pulmonary vascularity is normal. Slight scarring at the right lung base. Lungs are otherwise clear. No effusions. No acute bone abnormality. IMPRESSION: No acute abnormalities. Electronically Signed   By: Francene BoyersJames  Maxwell M.D.   On: 04/12/2017 12:27   Ct Angio Chest Pe W And/or Wo Contrast  Result Date: 04/12/2017 CLINICAL DATA:  Worsening shortness of breath and cough over the last 2 days. Headache. EXAM: CT ANGIOGRAPHY CHEST WITH CONTRAST TECHNIQUE: Multidetector CT imaging of the chest was performed using the standard protocol during bolus administration of intravenous contrast. Multiplanar CT image reconstructions and MIPs were obtained to evaluate the vascular anatomy. CONTRAST:  75mL ISOVUE-370 IOPAMIDOL (ISOVUE-370) INJECTION 76% COMPARISON:  Chest radiography same day.  CT 05/29/2015. FINDINGS:  Cardiovascular: Pulmonary arterial opacification is good. There are no pulmonary emboli. Minimal aortic atherosclerosis. Minimal coronary artery calcification.  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 These can be associated with dysphagia. Rarely, these can suffer from rupture or dissection. If the patient does not have a relationship with a vascular surgeon, that might be worth establishing for follow-up. Mediastinum/Nodes: No mass or adenopathy. Normal mediastinal nodes. Small hiatal hernia. Lungs/Pleura: Lungs are clear. No infiltrate or collapse. No significant focal finding. No pleural fluid. Upper Abdomen: Negative Musculoskeletal: Negative Review of the MIP images confirms the above findings. IMPRESSION: No pulmonary emboli or other acute chest pathology. 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 Mild aortic atherosclerosis and coronary artery calcification. See above discussion. Electronically Signed   By: Paulina Fusi M.D.   On: 04/12/2017 13:29     ASSESSMENT AND PLAN:   67 year old female with history of COPD and tobacco dependence who presents with shortness of breath.  1. Acute hypoxic respiratory failure in the setting of acute exacerbation of COPD with acute bronchitis I will continue IV steroids, nebs and inhalers. I will add doxycycline for acute bronchitis.  2. Hyperlipidemia: Continue statin  3. Essential hypertension: Continue lisinopril and HCTZ  4.Tobacco dependence: Patient is encouraged to quit smoking. Counseling was provided for 4 minutes.       Management plans discussed with the patient and she is in agreement.  CODE STATUS: full  TOTAL TIME TAKING CARE OF THIS PATIENT: 30 minutes.     POSSIBLE D/C tomorrow,  DEPENDING ON CLINICAL CONDITION.   Amilya Haver M.D on 04/13/2017 at 11:55 AM  Between 7am to 6pm - Pager - [phone] After 6pm go to [email or web address] - password [email or web address]  [facility] [place] [facility]  Office  [phone]  CC: Primary care physician; Pccm, [employer]-, MD  Note: This dictation was prepared with Dragon dictation along with smaller phrase technology. Any transcriptional errors that result from this process are unintentional.

## 2017-04-13 NOTE — Progress Notes (Signed)
Patient was not available upon visit. [facility] family arrives.

## 2017-04-14 NOTE — Progress Notes (Signed)
Chaplain followed up on OR for AD. Previously, the family had received education and asked for completion on the morning of 04/14/2017. Chaplain attempted to meet with the patient and family at 10:15. Family members were not present and the patient was asleep. Chaplain asked nurse to page the Chaplain when the family and patient were ready to complete the AD.

## 2017-04-14 NOTE — Evaluation (Signed)
Physical Therapy Evaluation Patient Details Name: Lauren Koch MRN: [id number] DOB: July 03, 1950 Today's Date: 04/14/2017   History of Present Illness  Pt is a 67 year old female admitted for a COPD exacerbation and acute on chronic respiratory failure.  PMH includes COPD, GERD, cataracts, asthma and Htn.  Clinical Impression  Pt is a 67 y.o. Female who lives in a mobile home with her daughter.  Pt was in bed upon PT arrival and was able to perform bed mobility independently.  Pt presented with generalized strength of UE and WNL shoulder flexion ROM.  She presented with WNL strength of BLE and reported no loss of sensation to light touch.  Pt was able to perform STS from EOB without physical assist or need for RW for support once standing.  She ambulated 80 ft in room, presenting with mild gait deviations indicative of fall risk as well as a TUG time which indicates fall risk with dynamic activity.  PT discussed with pt the benefit of use of a cane for fall prevention and stability for longer distance ambulation during the recovery process.  Pt will continue to benefit from skilled PT with focus on balance, ambulation with AD and tolerance to activity.    Follow Up Recommendations Home health PT    Equipment Recommendations       Recommendations for Other Services       Precautions / Restrictions Precautions Precautions: Fall Restrictions Weight Bearing Restrictions: No      Mobility  Bed Mobility Overal bed mobility: Independent                Transfers Overall transfer level: Independent                  Ambulation/Gait Ambulation/Gait assistance: Supervision Ambulation Distance (Feet): 50 Feet       Gait velocity interpretation: at or above normal speed for age/gender General Gait Details: Pt ambulated in room with PT following close behind with RW.  Pt maintained good foot clearance with lateral displacement to initiate swing phase and remains in a slightly  flexed posture.  Pt did appear to be fatigued but noted that she is feeling less SOB than she did when she previously ambulated.  Pt's O2 sats remained in the 94-95% range.  PT discussed with pt using SPC for stability when ambulating longer distance for fall prevention during the recovery process and pt stated that she has one that she can use at home.    Stairs            Wheelchair Mobility    Modified Rankin (Stroke Patients Only)       Balance Overall balance assessment: Independent                               Standardized Balance Assessment Standardized Balance Assessment : TUG: Timed Up and Go Test     Timed Up and Go Test TUG: Normal TUG Normal TUG (seconds): 13     Pertinent Vitals/Pain Pain Assessment: No/denies pain    Home Living Family/patient expects to be discharged to:: Private residence Living Arrangements: Children(Daughter) Available Help at Discharge: Family;Available PRN/intermittently Type of Home: Mobile home Home Access: Stairs to enter Entrance Stairs-Rails: Can reach both Entrance Stairs-Number of Steps: 3 Home Layout: One level Home Equipment: Walker - 2 wheels;Cane - single point      Prior Function Level of Independence: Independent  Comments: Pt is able to perform all ADL's independently; daughter drives when pt goes out.     Hand Dominance        Extremity/Trunk Assessment   Upper Extremity Assessment Upper Extremity Assessment: Generalized weakness    Lower Extremity Assessment Lower Extremity Assessment: Overall WFL for tasks assessed    Cervical / Trunk Assessment Cervical / Trunk Assessment: Normal  Communication   Communication: No difficulties  Cognition Arousal/Alertness: Awake/alert Behavior During Therapy: WFL for tasks assessed/performed Overall Cognitive Status: Within Functional Limits for tasks assessed                                        General Comments       Exercises     Assessment/Plan    PT Assessment Patient needs continued PT services  PT Problem List Decreased mobility;Decreased balance;Decreased knowledge of use of DME;Decreased activity tolerance;Cardiopulmonary status limiting activity       PT Treatment Interventions DME instruction;Therapeutic activities;Gait training;Therapeutic exercise;Stair training;Balance training;Functional mobility training;Neuromuscular re-education;Patient/family education    PT Goals (Current goals can be found in the Care Plan section)  Acute Rehab PT Goals Patient Stated Goal: To return home and continue to recover to normal level of mobility. PT Goal Formulation: With patient/family Time For Goal Achievement: 04/28/17 Potential to Achieve Goals: Good    Frequency Min 2X/week   Barriers to discharge        Co-evaluation               AM-PAC PT "6 Clicks" Daily Activity  Outcome Measure Difficulty turning over in bed (including adjusting bedclothes, sheets and blankets)?: None Difficulty moving from lying on back to sitting on the side of the bed? : A Little Difficulty sitting down on and standing up from a chair with arms (e.g., wheelchair, bedside commode, etc,.)?: A Little Help needed moving to and from a bed to chair (including a wheelchair)?: A Little Help needed walking in hospital room?: A Little Help needed climbing 3-5 steps with a railing? : A Little 6 Click Score: 19    End of Session Equipment Utilized During Treatment: Gait belt Activity Tolerance: Patient tolerated treatment well Patient left: in bed;with bed alarm set;with call bell/phone within reach   PT Visit Diagnosis: Unsteadiness on feet (R26.81)    Time: 9147-82951310-1325 PT Time Calculation (min) (ACUTE ONLY): 15 min   Charges:   PT Evaluation $PT Eval Low Complexity: 1 Low     PT G Codes:   PT G-Codes **NOT FOR INPATIENT CLASS** Functional Assessment Tool Used: AM-PAC 6 Clicks Basic Mobility     Glenetta HewSarah Keaden Gunnoe, PT, DPT   Glenetta HewSarah Reyanna Baley 04/14/2017, 1:51 PM

## 2017-04-14 NOTE — Progress Notes (Signed)
[employer] [facility] - [place] at [facility]   PATIENT NAME: Lauren Koch    MR#:  [id number]  DATE OF BIRTH:  Apr 16, 1950  SUBJECTIVE:   Patient continues to show some improvement however still short of breath when ambulating.  REVIEW OF SYSTEMS:    Review of Systems  Constitutional: Negative for fever, chills weight loss HENT: Negative for ear pain, nosebleeds, congestion, facial swelling, rhinorrhea, neck pain, neck stiffness and ear discharge.   Respiratory:  ++ cough, shortness of breath, wheezing  Cardiovascular: Negative for chest pain, palpitations and leg swelling.  Gastrointestinal: Negative for heartburn, abdominal pain, vomiting, diarrhea or consitpation Genitourinary: Negative for dysuria, urgency, frequency, hematuria Musculoskeletal: Negative for back pain or joint pain Neurological: Negative for dizziness, seizures, syncope, focal weakness,  numbness and headaches.  Hematological: Does not bruise/bleed easily.  Psychiatric/Behavioral: Negative for hallucinations, confusion, dysphoric mood    Tolerating Diet: yes      DRUG ALLERGIES:   Allergies  Allergen Reactions  . Prednisone Rash and Other (See Comments)    Arrhythmia, also (Has tolerated hydrocortisone and decadron)     VITALS:  Blood pressure 117/61, pulse 71, temperature 97.9 F (36.6 C), temperature source Oral, resp. rate 20, height 5\' 1"  (1.549 m), weight 88 kg (194 lb), SpO2 94 %.  PHYSICAL EXAMINATION:  Constitutional: Appears well-developed and well-nourished. No distress. HENT: Normocephalic. Marland Kitchen Oropharynx is clear and moist.  Eyes: Conjunctivae and EOM are normal. PERRLA, no scleral icterus.  Neck: Normal ROM. Neck supple. No JVD. No tracheal deviation. CVS: RRR, S1/S2 +, no murmurs, no gallops, no carotid bruit.  Pulmonary: Normal respiratory effort with bilateral expiratory prolonged wheezing  Abdominal: Soft. BS +,  no distension, tenderness, rebound or guarding.   Musculoskeletal: Normal range of motion. No edema and no tenderness.  Neuro: Alert. CN 2-12 grossly intact. No focal deficits. Skin: Skin is warm and dry. No rash noted. Psychiatric: Normal mood and affect.      LABORATORY PANEL:   CBC Recent Labs  Lab 04/13/17 0407  WBC 9.6  HGB 14.1  HCT 41.3  PLT 283   ------------------------------------------------------------------------------------------------------------------  Chemistries  Recent Labs  Lab 04/13/17 0407  NA 135  K 3.5  CL 101  CO2 21*  GLUCOSE 179*  BUN 28*  CREATININE 1.10*  CALCIUM 9.5   ------------------------------------------------------------------------------------------------------------------  Cardiac Enzymes Recent Labs  Lab 04/12/17 1140  TROPONINI <0.03   ------------------------------------------------------------------------------------------------------------------  RADIOLOGY:  Dg Chest 2 View  Result Date: 04/12/2017 CLINICAL DATA:  Shortness of breath.  Cough. EXAM: CHEST - 2 VIEW COMPARISON:  01/30/2017 and 05/29/2015 and CT scan dated 05/29/2015 FINDINGS: Heart size is normal. Right-sided aortic arch. The descending thoracic aorta is on the right as well. Pulmonary vascularity is normal. Slight scarring at the right lung base. Lungs are otherwise clear. No effusions. No acute bone abnormality. IMPRESSION: No acute abnormalities. Electronically Signed   By: Francene Boyers M.D.   On: 04/12/2017 12:27   Ct Angio Chest Pe W And/or Wo Contrast  Result Date: 04/12/2017 CLINICAL DATA:  Worsening shortness of breath and cough over the last 2 days. Headache. EXAM: CT ANGIOGRAPHY CHEST WITH CONTRAST TECHNIQUE: Multidetector CT imaging of the chest was performed using the standard protocol during bolus administration of intravenous contrast. Multiplanar CT image reconstructions and MIPs were obtained to evaluate the vascular anatomy. CONTRAST:  75mL ISOVUE-370 IOPAMIDOL (ISOVUE-370) INJECTION 76%  COMPARISON:  Chest radiography same day.  CT 05/29/2015. FINDINGS: Cardiovascular: Pulmonary arterial opacification is good. There are no  pulmonary emboli. Minimal aortic atherosclerosis. Minimal coronary artery calcification. 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 These can be associated with dysphagia. Rarely, these can suffer from rupture or dissection. If the patient does not have a relationship with a vascular surgeon, that might be worth establishing for follow-up. Mediastinum/Nodes: No mass or adenopathy. Normal mediastinal nodes. Small hiatal hernia. Lungs/Pleura: Lungs are clear. No infiltrate or collapse. No significant focal finding. No pleural fluid. Upper Abdomen: Negative Musculoskeletal: Negative Review of the MIP images confirms the above findings. IMPRESSION: No pulmonary emboli or other acute chest pathology. 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 Mild aortic atherosclerosis and coronary artery calcification. See above discussion. Electronically Signed   By: Paulina FusiMark  Shogry M.D.   On: 04/12/2017 13:29     ASSESSMENT AND PLAN:   67 year old female with history of COPD and tobacco dependence who presents with shortness of breath.  1. Acute hypoxic respiratory failure in the setting of acute exacerbation of COPD with acute bronchitis Continue IV steroids, nebs and inhalers.  Continue doxycycline for acute bronchitis.  2. Hyperlipidemia: Continue statin  3. Essential hypertension: Continue lisinopril and HCTZ  4.Tobacco dependence: Patient is encouraged to quit smoking. Counseling was provided for 4 minutes.  PT consultation placed for discharge planning     Management plans discussed with the patient and she is in  agreement.  CODE STATUS: full  TOTAL TIME TAKING CARE OF THIS PATIENT: 24 minutes.     POSSIBLE D/C tomorrow, DEPENDING ON CLINICAL CONDITION.   Gerold Sar M.D on 04/14/2017 at 11:29 AM  Between 7am to 6pm - Pager - [phone] After 6pm go to [email or web address] - password [email or web address]  [facility] [place] [facility]  Office  [phone]  CC: Primary care physician; Pccm, [employer], MD  Note: This dictation was prepared with Dragon dictation along with smaller phrase technology. Any transcriptional errors that result from this process are unintentional.

## 2017-04-15 MED ORDER — TIOTROPIUM BROMIDE MONOHYDRATE 18 MCG IN CAPS: 18.0000 ug | ORAL_CAPSULE | Freq: Every day | RESPIRATORY_TRACT | 2 refills | Status: DC

## 2017-04-15 MED ORDER — ASPIRIN 81 MG PO TBEC: 81.0000 mg | DELAYED_RELEASE_TABLET | Freq: Every day | ORAL | Status: DC

## 2017-04-15 MED ORDER — IPRATROPIUM-ALBUTEROL 0.5-2.5 (3) MG/3ML IN SOLN: 3.0000 mL | Freq: Four times a day (QID) | RESPIRATORY_TRACT | 0 refills | Status: DC

## 2017-04-15 MED ORDER — NICOTINE 21 MG/24HR TD PT24: 21.0000 mg | MEDICATED_PATCH | TRANSDERMAL | 1 refills | Status: DC

## 2017-04-15 MED ORDER — DOXYCYCLINE HYCLATE 100 MG PO TABS: 100.0000 mg | ORAL_TABLET | Freq: Two times a day (BID) | ORAL | 0 refills | Status: DC

## 2017-04-15 MED ORDER — ATORVASTATIN CALCIUM 40 MG PO TABS: 40.0000 mg | ORAL_TABLET | Freq: Every day | ORAL | 0 refills | Status: DC

## 2017-04-15 NOTE — Care Management (Signed)
Patient admitted with COPD.  Patient lives at home with daughter.  Patient does not currently have PCP.  New patient appointment made for 05/19/17.  PT has assessed patient and recommended home health PT.  Patient agreeable to home health services.  Patient has RW and cane available in the home.  Patient to discharge home with home nebulizer.  Barbara CowerJason from [facility] to deliver nebulizer prior to discharge.  Dr. Juliene PinaMody agreeable to sign home health orders in the interim.  Patient states that she does not have a home health agency preference.  [facility].  RNCM signing off

## 2017-04-15 NOTE — Care Management Important Message (Signed)
Important Message  Patient Details  Name: Lauren MayShelia D Halle MRN: [id number] Date of Birth: 11/08/1950   Medicare Important Message Given:  Yes    Chapman FitchBOWEN, Magalie Almon T, RN 04/15/2017, 11:12 AM

## 2017-04-15 NOTE — Discharge Summary (Signed)
[employer] [facility] [facility] [place] [facility]   PATIENT NAME: Lauren Koch PatientShelia Koch    MR#:  [id number]  DATE OF BIRTH:  25-May-1950  DATE OF ADMISSION:  04/12/2017 ADMITTING PHYSICIAN: Alford Highlandichard Wieting, MD  DATE OF DISCHARGE: 04/15/2017  PRIMARY CARE PHYSICIAN: Pccm, [employer], MD    ADMISSION DIAGNOSIS:  COPD exacerbation (HCC) [J44.1] Acute on chronic respiratory failure with hypoxia (HCC) [J96.21]  DISCHARGE DIAGNOSIS:  Active Problems:   COPD exacerbation (HCC)   SECONDARY DIAGNOSIS:   Past Medical History:  Diagnosis Date  . Asthma   . Cataract   . COPD (chronic obstructive pulmonary disease) (HCC)   . GERD (gastroesophageal reflux disease)   . Hypertension   . Smoker     HOSPITAL COURSE:    67 year old female with history of COPD and tobacco dependence who presents with shortness of breath.  1. Acute hypoxic respiratory failure in the setting of acute exacerbation of COPD with acute bronchitis He has been weaned off of oxygen. Patient treated with IV steroids doxycycline.  She is stable for discharge today.  She is allergic to prednisone and therefore cannot be discharged on oral prednisone.  She will continue doxycycline for the treatment of acute bronchitis.  She will be discharged on nebulizer treatments in addition to Spiriva. He is referred to pulmonary rehab upon discharge.   2. Hyperlipidemia: Continue statin  3. Essential hypertension: Continue lisinopril and HCTZ  4.Tobacco dependence: Patient is encouraged to quit smoking. Counseling was provided for 4 minutes. Nicotine patch upon discharge  PT  has recommended home with home health    DISCHARGE CONDITIONS AND DIET:   Stable for discharge on cardiac diet  CONSULTS OBTAINED:    DRUG ALLERGIES:   Allergies  Allergen Reactions  . Prednisone Rash and Other (See Comments)    Arrhythmia, also (Has tolerated hydrocortisone and decadron)     DISCHARGE MEDICATIONS:   Allergies as of  04/15/2017      Reactions   Prednisone Rash, Other (See Comments)   Arrhythmia, also (Has tolerated hydrocortisone and decadron)      Medication List    TAKE these medications   albuterol 108 (90 Base) MCG/ACT inhaler Commonly known as:  PROVENTIL HFA;VENTOLIN HFA [name]   aspirin 81 MG EC tablet [name]   atorvastatin 40 MG tablet Commonly known as:  LIPITOR [name]   doxycycline 100 MG tablet Commonly known as:  VIBRA-TABS Take 1 tablet (100 mg total) by mouth every 12 (twelve) hours.   hydrochlorothiazide 25 MG tablet Commonly known as:  HYDRODIURIL TAKE ONE TABLET BY MOUTH ONCE DAILY   ipratropium-albuterol 0.5-2.5 (3) MG/3ML Soln Commonly known as:  DUONEB [name]   lisinopril 20 MG tablet Commonly known as:  PRINIVIL,ZESTRIL [name]   nicotine 21 mg/24hr patch Commonly known as:  NICODERM CQ - dosed in mg/24 hours Place 1 patch (21 mg total) onto the skin daily.   tiotropium 18 MCG inhalation capsule Commonly known as:  SPIRIVA HANDIHALER Place 1 capsule (18 mcg total) into inhaler and inhale daily.            Durable Medical Equipment  (From admission, onward)        Start     Ordered  04/15/17 0000  DME Nebulizer machine    Question:  Patient needs a nebulizer to treat with the following condition  Answer:  COPD (chronic obstructive pulmonary disease) (HCC)   04/15/17 0909        Today   CHIEF COMPLAINT:   Patient feeling better this morning.  Ambulating with physical therapy.  She did not require oxygen upon discharge   VITAL SIGNS:  Blood pressure (!) 149/74, pulse 60, temperature 98 F (36.7 C), temperature source Oral, resp. rate 20, height 5\' 1"  (1.549 m), weight 88 kg (194 lb), SpO2 92  %.   REVIEW OF SYSTEMS:  Review of Systems  Constitutional: Negative.  Negative for chills, fever and malaise/fatigue.  HENT: Negative.  Negative for ear discharge, ear pain, hearing loss, nosebleeds and sore throat.   Eyes: Negative.  Negative for blurred vision and pain.  Respiratory: Positive for cough. Negative for hemoptysis, shortness of breath and wheezing.   Cardiovascular: Negative.  Negative for chest pain, palpitations and leg swelling.  Gastrointestinal: Negative.  Negative for abdominal pain, blood in stool, diarrhea, nausea and vomiting.  Genitourinary: Negative.  Negative for dysuria.  Musculoskeletal: Negative.  Negative for back pain.  Skin: Negative.   Neurological: Negative for dizziness, tremors, speech change, focal weakness, seizures and headaches.  Endo/Heme/Allergies: Negative.  Does not bruise/bleed easily.  Psychiatric/Behavioral: Negative.  Negative for depression, hallucinations and suicidal ideas.     PHYSICAL EXAMINATION:  GENERAL:  67 y.o.-year-old patient lying in the bed with no acute distress.  NECK:  Supple, no jugular venous distention. No thyroid enlargement, no tenderness.  LUNGS: Minimal expiratory wheezing normal respiratory CARDIOVASCULAR: S1, S2 normal. No murmurs, rubs, or gallops.  ABDOMEN: Soft, non-tender, non-distended. Bowel sounds present. No organomegaly or mass.  EXTREMITIES: No pedal edema, cyanosis, or clubbing.  PSYCHIATRIC: The patient is alert and oriented x 3.  SKIN: No obvious rash, lesion, or ulcer.   DATA REVIEW:   CBC Recent Labs  Lab 04/13/17 0407  WBC 9.6  HGB 14.1  HCT 41.3  PLT 283    Chemistries  Recent Labs  Lab 04/13/17 0407  NA 135  K 3.5  CL 101  CO2 21*  GLUCOSE 179*  BUN 28*  CREATININE 1.10*  CALCIUM 9.5    Cardiac Enzymes Recent Labs  Lab 04/12/17 1140  TROPONINI <0.03    Microbiology Results  @MICRORSLT48 @  RADIOLOGY:  No results found.    Allergies as of 04/15/2017       Reactions   Prednisone Rash, Other (See Comments)   Arrhythmia, also (Has tolerated hydrocortisone and decadron)      Medication List    TAKE these medications   albuterol 108 (90 Base) MCG/ACT inhaler Commonly known as:  PROVENTIL HFA;VENTOLIN HFA [name]   aspirin 81 MG EC tablet [name]   atorvastatin 40 MG tablet Commonly known as:  LIPITOR [name]   doxycycline 100 MG tablet Commonly known as:  VIBRA-TABS Take 1 tablet (100 mg total) by mouth every 12 (twelve) hours.   hydrochlorothiazide 25 MG tablet Commonly known as:  HYDRODIURIL TAKE ONE TABLET BY MOUTH ONCE DAILY   ipratropium-albuterol 0.5-2.5 (3) MG/3ML Soln Commonly known as:  DUONEB [name]   lisinopril 20 MG tablet Commonly known as:  PRINIVIL,ZESTRIL [name]   nicotine 21 mg/24hr patch Commonly known as:  NICODERM CQ - dosed in mg/24 hours Place 1 patch (21 mg total) onto the skin daily.   tiotropium 18 MCG inhalation capsule Commonly known as:  SPIRIVA HANDIHALER Place 1 capsule (18 mcg total) into inhaler and inhale daily.            Durable Medical Equipment  (From admission, onward)        Start     Ordered   04/15/17 0000  DME Nebulizer machine    Question:  Patient needs a nebulizer to treat with the following condition  Answer:  COPD (chronic obstructive pulmonary disease) (HCC)   04/15/17 0909         Management plans discussed with the patient and she is in agreement. Stable for discharge home with [facility]  Patient should follow up with pcp  CODE STATUS:     Code Status Orders  (From admission, onward)        Start     Ordered   04/12/17 1434  Full code  Continuous     04/12/17 1433    Code Status History    Date Active Date Inactive  Code Status Order ID Comments User Context   03/20/2016 17:50 03/23/2016 21:47 Full Code [id number]  Floreen Comber, RN Inpatient   05/29/2015 14:56 06/03/2015 17:38 Full Code [id number]  Elliot Cousin, MD ED      TOTAL TIME TAKING CARE OF THIS PATIENT: 38 minutes.    Note: This dictation was prepared with Dragon dictation along with smaller phrase technology. Any transcriptional errors that result from this process are unintentional.  Demiana Crumbley M.D on 04/15/2017 at 9:13 AM  Between 7am to 6pm - Pager - [phone] After 6pm go to [email or web address] - Social research officer, government  [facility]  Office  [phone]  CC: Primary care physician; Pccm, Raymond Gurney, MD

## 2017-04-15 NOTE — Progress Notes (Signed)
Discharge teaching given to patient, patient verbalized understanding and had no questions. Patient IV removed. Patient will be transported home by family. All patient belongings gathered prior to leaving. Nebulizer machine delivered prior to discharge, patients daughter stated that she was aware of how to operate machine. Patient has O2 tank at home in case she needs it.

## 2017-04-18 DIAGNOSIS — J441 Chronic obstructive pulmonary disease with (acute) exacerbation: Secondary | ICD-10-CM | POA: Diagnosis not present

## 2017-04-18 DIAGNOSIS — J9621 Acute and chronic respiratory failure with hypoxia: Secondary | ICD-10-CM | POA: Diagnosis not present

## 2017-04-18 DIAGNOSIS — Z8673 Personal history of transient ischemic attack (TIA), and cerebral infarction without residual deficits: Secondary | ICD-10-CM | POA: Diagnosis not present

## 2017-04-18 DIAGNOSIS — H269 Unspecified cataract: Secondary | ICD-10-CM | POA: Diagnosis not present

## 2017-04-18 DIAGNOSIS — F17210 Nicotine dependence, cigarettes, uncomplicated: Secondary | ICD-10-CM | POA: Diagnosis not present

## 2017-04-18 DIAGNOSIS — E785 Hyperlipidemia, unspecified: Secondary | ICD-10-CM | POA: Diagnosis not present

## 2017-04-18 DIAGNOSIS — I10 Essential (primary) hypertension: Secondary | ICD-10-CM | POA: Diagnosis not present

## 2017-04-18 LAB — CUP PACEART REMOTE DEVICE CHECK
Date Time Interrogation Session: 20190216024433
MDC IDC PG IMPLANT DT: 20180220

## 2017-04-20 ENCOUNTER — Ambulatory Visit (INDEPENDENT_AMBULATORY_CARE_PROVIDER_SITE_OTHER): Payer: Medicare Other | Admitting: *Deleted

## 2017-04-20 DIAGNOSIS — J441 Chronic obstructive pulmonary disease with (acute) exacerbation: Secondary | ICD-10-CM | POA: Diagnosis not present

## 2017-04-20 DIAGNOSIS — E785 Hyperlipidemia, unspecified: Secondary | ICD-10-CM | POA: Diagnosis not present

## 2017-04-20 DIAGNOSIS — I639 Cerebral infarction, unspecified: Secondary | ICD-10-CM

## 2017-04-20 DIAGNOSIS — F17210 Nicotine dependence, cigarettes, uncomplicated: Secondary | ICD-10-CM | POA: Diagnosis not present

## 2017-04-20 DIAGNOSIS — J9621 Acute and chronic respiratory failure with hypoxia: Secondary | ICD-10-CM | POA: Diagnosis not present

## 2017-04-20 DIAGNOSIS — H269 Unspecified cataract: Secondary | ICD-10-CM | POA: Diagnosis not present

## 2017-04-20 DIAGNOSIS — I10 Essential (primary) hypertension: Secondary | ICD-10-CM | POA: Diagnosis not present

## 2017-04-21 DIAGNOSIS — I10 Essential (primary) hypertension: Secondary | ICD-10-CM | POA: Diagnosis not present

## 2017-04-21 DIAGNOSIS — H269 Unspecified cataract: Secondary | ICD-10-CM | POA: Diagnosis not present

## 2017-04-21 DIAGNOSIS — J441 Chronic obstructive pulmonary disease with (acute) exacerbation: Secondary | ICD-10-CM | POA: Diagnosis not present

## 2017-04-21 DIAGNOSIS — F17210 Nicotine dependence, cigarettes, uncomplicated: Secondary | ICD-10-CM | POA: Diagnosis not present

## 2017-04-21 DIAGNOSIS — E785 Hyperlipidemia, unspecified: Secondary | ICD-10-CM | POA: Diagnosis not present

## 2017-04-21 DIAGNOSIS — J9621 Acute and chronic respiratory failure with hypoxia: Secondary | ICD-10-CM | POA: Diagnosis not present

## 2017-04-21 NOTE — Progress Notes (Signed)
Carelink Summary Report / Loop Recorder 

## 2017-04-22 DIAGNOSIS — F17210 Nicotine dependence, cigarettes, uncomplicated: Secondary | ICD-10-CM | POA: Diagnosis not present

## 2017-04-22 DIAGNOSIS — J441 Chronic obstructive pulmonary disease with (acute) exacerbation: Secondary | ICD-10-CM | POA: Diagnosis not present

## 2017-04-22 DIAGNOSIS — H269 Unspecified cataract: Secondary | ICD-10-CM | POA: Diagnosis not present

## 2017-04-22 DIAGNOSIS — I10 Essential (primary) hypertension: Secondary | ICD-10-CM | POA: Diagnosis not present

## 2017-04-22 DIAGNOSIS — E785 Hyperlipidemia, unspecified: Secondary | ICD-10-CM | POA: Diagnosis not present

## 2017-04-22 DIAGNOSIS — J9621 Acute and chronic respiratory failure with hypoxia: Secondary | ICD-10-CM | POA: Diagnosis not present

## 2017-04-25 DIAGNOSIS — E785 Hyperlipidemia, unspecified: Secondary | ICD-10-CM | POA: Diagnosis not present

## 2017-04-25 DIAGNOSIS — J9621 Acute and chronic respiratory failure with hypoxia: Secondary | ICD-10-CM | POA: Diagnosis not present

## 2017-04-25 DIAGNOSIS — H269 Unspecified cataract: Secondary | ICD-10-CM | POA: Diagnosis not present

## 2017-04-25 DIAGNOSIS — J441 Chronic obstructive pulmonary disease with (acute) exacerbation: Secondary | ICD-10-CM | POA: Diagnosis not present

## 2017-04-25 DIAGNOSIS — I10 Essential (primary) hypertension: Secondary | ICD-10-CM | POA: Diagnosis not present

## 2017-04-25 DIAGNOSIS — F17210 Nicotine dependence, cigarettes, uncomplicated: Secondary | ICD-10-CM | POA: Diagnosis not present

## 2017-04-27 DIAGNOSIS — H269 Unspecified cataract: Secondary | ICD-10-CM | POA: Diagnosis not present

## 2017-04-27 DIAGNOSIS — I10 Essential (primary) hypertension: Secondary | ICD-10-CM | POA: Diagnosis not present

## 2017-04-27 DIAGNOSIS — J9621 Acute and chronic respiratory failure with hypoxia: Secondary | ICD-10-CM | POA: Diagnosis not present

## 2017-04-27 DIAGNOSIS — J441 Chronic obstructive pulmonary disease with (acute) exacerbation: Secondary | ICD-10-CM | POA: Diagnosis not present

## 2017-04-27 DIAGNOSIS — F17210 Nicotine dependence, cigarettes, uncomplicated: Secondary | ICD-10-CM | POA: Diagnosis not present

## 2017-04-27 DIAGNOSIS — E785 Hyperlipidemia, unspecified: Secondary | ICD-10-CM | POA: Diagnosis not present

## 2017-04-28 ENCOUNTER — Telehealth: Payer: Self-pay | Admitting: Internal Medicine

## 2017-04-28 DIAGNOSIS — H269 Unspecified cataract: Secondary | ICD-10-CM | POA: Diagnosis not present

## 2017-04-28 DIAGNOSIS — I10 Essential (primary) hypertension: Secondary | ICD-10-CM | POA: Diagnosis not present

## 2017-04-28 DIAGNOSIS — F17210 Nicotine dependence, cigarettes, uncomplicated: Secondary | ICD-10-CM | POA: Diagnosis not present

## 2017-04-28 DIAGNOSIS — J441 Chronic obstructive pulmonary disease with (acute) exacerbation: Secondary | ICD-10-CM | POA: Diagnosis not present

## 2017-04-28 DIAGNOSIS — J9621 Acute and chronic respiratory failure with hypoxia: Secondary | ICD-10-CM | POA: Diagnosis not present

## 2017-04-28 DIAGNOSIS — E785 Hyperlipidemia, unspecified: Secondary | ICD-10-CM | POA: Diagnosis not present

## 2017-04-28 NOTE — Telephone Encounter (Signed)
Can paperwork be filled out during appointment?

## 2017-04-28 NOTE — Telephone Encounter (Signed)
Ok   TMS 

## 2017-04-28 NOTE — Telephone Encounter (Signed)
Copied from CRM [id number]. Topic: Quick Communication - See Telephone Encounter >> Apr 28, 2017 12:55 PM Arlyss Gandyichardson, Annalyce Lanpher N, NT wrote: CRM for notification. See Telephone encounter for: 04/28/17. Lyla SonCarrie with [facility] is wanting to see updated paperwork to certify the need for the oxygen. She would like to see if this can be done when the pt comes in for her appointment. CB#: [phone] ext [phone]

## 2017-04-29 DIAGNOSIS — J441 Chronic obstructive pulmonary disease with (acute) exacerbation: Secondary | ICD-10-CM | POA: Diagnosis not present

## 2017-04-29 DIAGNOSIS — E785 Hyperlipidemia, unspecified: Secondary | ICD-10-CM | POA: Diagnosis not present

## 2017-04-29 DIAGNOSIS — J9621 Acute and chronic respiratory failure with hypoxia: Secondary | ICD-10-CM | POA: Diagnosis not present

## 2017-04-29 DIAGNOSIS — I10 Essential (primary) hypertension: Secondary | ICD-10-CM | POA: Diagnosis not present

## 2017-04-29 DIAGNOSIS — H269 Unspecified cataract: Secondary | ICD-10-CM | POA: Diagnosis not present

## 2017-04-29 DIAGNOSIS — F17210 Nicotine dependence, cigarettes, uncomplicated: Secondary | ICD-10-CM | POA: Diagnosis not present

## 2017-04-29 NOTE — Telephone Encounter (Signed)
Left message informing Lyla SonCarrie of what Dr. French Anaracy stated.

## 2017-05-02 DIAGNOSIS — I10 Essential (primary) hypertension: Secondary | ICD-10-CM | POA: Diagnosis not present

## 2017-05-02 DIAGNOSIS — F17210 Nicotine dependence, cigarettes, uncomplicated: Secondary | ICD-10-CM | POA: Diagnosis not present

## 2017-05-02 DIAGNOSIS — J441 Chronic obstructive pulmonary disease with (acute) exacerbation: Secondary | ICD-10-CM | POA: Diagnosis not present

## 2017-05-02 DIAGNOSIS — J9621 Acute and chronic respiratory failure with hypoxia: Secondary | ICD-10-CM | POA: Diagnosis not present

## 2017-05-02 DIAGNOSIS — E785 Hyperlipidemia, unspecified: Secondary | ICD-10-CM | POA: Diagnosis not present

## 2017-05-02 DIAGNOSIS — H269 Unspecified cataract: Secondary | ICD-10-CM | POA: Diagnosis not present

## 2017-05-03 DIAGNOSIS — F17210 Nicotine dependence, cigarettes, uncomplicated: Secondary | ICD-10-CM | POA: Diagnosis not present

## 2017-05-03 DIAGNOSIS — J441 Chronic obstructive pulmonary disease with (acute) exacerbation: Secondary | ICD-10-CM | POA: Diagnosis not present

## 2017-05-03 DIAGNOSIS — H269 Unspecified cataract: Secondary | ICD-10-CM | POA: Diagnosis not present

## 2017-05-03 DIAGNOSIS — I10 Essential (primary) hypertension: Secondary | ICD-10-CM | POA: Diagnosis not present

## 2017-05-03 DIAGNOSIS — E785 Hyperlipidemia, unspecified: Secondary | ICD-10-CM | POA: Diagnosis not present

## 2017-05-03 DIAGNOSIS — J9621 Acute and chronic respiratory failure with hypoxia: Secondary | ICD-10-CM | POA: Diagnosis not present

## 2017-05-04 DIAGNOSIS — E785 Hyperlipidemia, unspecified: Secondary | ICD-10-CM | POA: Diagnosis not present

## 2017-05-04 DIAGNOSIS — I10 Essential (primary) hypertension: Secondary | ICD-10-CM | POA: Diagnosis not present

## 2017-05-04 DIAGNOSIS — J441 Chronic obstructive pulmonary disease with (acute) exacerbation: Secondary | ICD-10-CM | POA: Diagnosis not present

## 2017-05-04 DIAGNOSIS — J9621 Acute and chronic respiratory failure with hypoxia: Secondary | ICD-10-CM | POA: Diagnosis not present

## 2017-05-04 DIAGNOSIS — H269 Unspecified cataract: Secondary | ICD-10-CM | POA: Diagnosis not present

## 2017-05-04 DIAGNOSIS — F17210 Nicotine dependence, cigarettes, uncomplicated: Secondary | ICD-10-CM | POA: Diagnosis not present

## 2017-05-05 DIAGNOSIS — I10 Essential (primary) hypertension: Secondary | ICD-10-CM | POA: Diagnosis not present

## 2017-05-05 DIAGNOSIS — E785 Hyperlipidemia, unspecified: Secondary | ICD-10-CM | POA: Diagnosis not present

## 2017-05-05 DIAGNOSIS — H269 Unspecified cataract: Secondary | ICD-10-CM | POA: Diagnosis not present

## 2017-05-05 DIAGNOSIS — F17210 Nicotine dependence, cigarettes, uncomplicated: Secondary | ICD-10-CM | POA: Diagnosis not present

## 2017-05-05 DIAGNOSIS — J9621 Acute and chronic respiratory failure with hypoxia: Secondary | ICD-10-CM | POA: Diagnosis not present

## 2017-05-05 DIAGNOSIS — J441 Chronic obstructive pulmonary disease with (acute) exacerbation: Secondary | ICD-10-CM | POA: Diagnosis not present

## 2017-05-06 DIAGNOSIS — J9621 Acute and chronic respiratory failure with hypoxia: Secondary | ICD-10-CM | POA: Diagnosis not present

## 2017-05-06 DIAGNOSIS — H269 Unspecified cataract: Secondary | ICD-10-CM | POA: Diagnosis not present

## 2017-05-06 DIAGNOSIS — F17210 Nicotine dependence, cigarettes, uncomplicated: Secondary | ICD-10-CM | POA: Diagnosis not present

## 2017-05-06 DIAGNOSIS — I10 Essential (primary) hypertension: Secondary | ICD-10-CM | POA: Diagnosis not present

## 2017-05-06 DIAGNOSIS — E785 Hyperlipidemia, unspecified: Secondary | ICD-10-CM | POA: Diagnosis not present

## 2017-05-06 DIAGNOSIS — J441 Chronic obstructive pulmonary disease with (acute) exacerbation: Secondary | ICD-10-CM | POA: Diagnosis not present

## 2017-05-09 DIAGNOSIS — I10 Essential (primary) hypertension: Secondary | ICD-10-CM | POA: Diagnosis not present

## 2017-05-09 DIAGNOSIS — F17210 Nicotine dependence, cigarettes, uncomplicated: Secondary | ICD-10-CM | POA: Diagnosis not present

## 2017-05-09 DIAGNOSIS — E785 Hyperlipidemia, unspecified: Secondary | ICD-10-CM | POA: Diagnosis not present

## 2017-05-09 DIAGNOSIS — H269 Unspecified cataract: Secondary | ICD-10-CM | POA: Diagnosis not present

## 2017-05-09 DIAGNOSIS — J9621 Acute and chronic respiratory failure with hypoxia: Secondary | ICD-10-CM | POA: Diagnosis not present

## 2017-05-09 DIAGNOSIS — J441 Chronic obstructive pulmonary disease with (acute) exacerbation: Secondary | ICD-10-CM | POA: Diagnosis not present

## 2017-05-10 DIAGNOSIS — I10 Essential (primary) hypertension: Secondary | ICD-10-CM | POA: Diagnosis not present

## 2017-05-10 DIAGNOSIS — J441 Chronic obstructive pulmonary disease with (acute) exacerbation: Secondary | ICD-10-CM | POA: Diagnosis not present

## 2017-05-10 DIAGNOSIS — H269 Unspecified cataract: Secondary | ICD-10-CM | POA: Diagnosis not present

## 2017-05-10 DIAGNOSIS — J9621 Acute and chronic respiratory failure with hypoxia: Secondary | ICD-10-CM | POA: Diagnosis not present

## 2017-05-10 DIAGNOSIS — E785 Hyperlipidemia, unspecified: Secondary | ICD-10-CM | POA: Diagnosis not present

## 2017-05-10 DIAGNOSIS — F17210 Nicotine dependence, cigarettes, uncomplicated: Secondary | ICD-10-CM | POA: Diagnosis not present

## 2017-05-11 DIAGNOSIS — J9621 Acute and chronic respiratory failure with hypoxia: Secondary | ICD-10-CM | POA: Diagnosis not present

## 2017-05-11 DIAGNOSIS — E785 Hyperlipidemia, unspecified: Secondary | ICD-10-CM | POA: Diagnosis not present

## 2017-05-11 DIAGNOSIS — H269 Unspecified cataract: Secondary | ICD-10-CM | POA: Diagnosis not present

## 2017-05-11 DIAGNOSIS — F17210 Nicotine dependence, cigarettes, uncomplicated: Secondary | ICD-10-CM | POA: Diagnosis not present

## 2017-05-11 DIAGNOSIS — I10 Essential (primary) hypertension: Secondary | ICD-10-CM | POA: Diagnosis not present

## 2017-05-11 DIAGNOSIS — J441 Chronic obstructive pulmonary disease with (acute) exacerbation: Secondary | ICD-10-CM | POA: Diagnosis not present

## 2017-05-16 DIAGNOSIS — E785 Hyperlipidemia, unspecified: Secondary | ICD-10-CM | POA: Diagnosis not present

## 2017-05-16 DIAGNOSIS — J441 Chronic obstructive pulmonary disease with (acute) exacerbation: Secondary | ICD-10-CM | POA: Diagnosis not present

## 2017-05-16 DIAGNOSIS — I10 Essential (primary) hypertension: Secondary | ICD-10-CM | POA: Diagnosis not present

## 2017-05-16 DIAGNOSIS — J9621 Acute and chronic respiratory failure with hypoxia: Secondary | ICD-10-CM | POA: Diagnosis not present

## 2017-05-16 DIAGNOSIS — F17210 Nicotine dependence, cigarettes, uncomplicated: Secondary | ICD-10-CM | POA: Diagnosis not present

## 2017-05-16 DIAGNOSIS — H269 Unspecified cataract: Secondary | ICD-10-CM | POA: Diagnosis not present

## 2017-05-18 DIAGNOSIS — E785 Hyperlipidemia, unspecified: Secondary | ICD-10-CM | POA: Diagnosis not present

## 2017-05-18 DIAGNOSIS — J441 Chronic obstructive pulmonary disease with (acute) exacerbation: Secondary | ICD-10-CM | POA: Diagnosis not present

## 2017-05-18 DIAGNOSIS — J9621 Acute and chronic respiratory failure with hypoxia: Secondary | ICD-10-CM | POA: Diagnosis not present

## 2017-05-18 DIAGNOSIS — H269 Unspecified cataract: Secondary | ICD-10-CM | POA: Diagnosis not present

## 2017-05-18 DIAGNOSIS — F17210 Nicotine dependence, cigarettes, uncomplicated: Secondary | ICD-10-CM | POA: Diagnosis not present

## 2017-05-18 DIAGNOSIS — I10 Essential (primary) hypertension: Secondary | ICD-10-CM | POA: Diagnosis not present

## 2017-05-19 ENCOUNTER — Ambulatory Visit: Payer: Medicare Other | Admitting: Internal Medicine

## 2017-05-20 ENCOUNTER — Other Ambulatory Visit: Payer: Self-pay | Admitting: *Deleted

## 2017-05-20 DIAGNOSIS — J9621 Acute and chronic respiratory failure with hypoxia: Secondary | ICD-10-CM | POA: Diagnosis not present

## 2017-05-20 DIAGNOSIS — E785 Hyperlipidemia, unspecified: Secondary | ICD-10-CM | POA: Diagnosis not present

## 2017-05-20 DIAGNOSIS — I10 Essential (primary) hypertension: Secondary | ICD-10-CM | POA: Diagnosis not present

## 2017-05-20 DIAGNOSIS — F17210 Nicotine dependence, cigarettes, uncomplicated: Secondary | ICD-10-CM | POA: Diagnosis not present

## 2017-05-20 DIAGNOSIS — H269 Unspecified cataract: Secondary | ICD-10-CM | POA: Diagnosis not present

## 2017-05-20 DIAGNOSIS — J449 Chronic obstructive pulmonary disease, unspecified: Secondary | ICD-10-CM

## 2017-05-20 DIAGNOSIS — J441 Chronic obstructive pulmonary disease with (acute) exacerbation: Secondary | ICD-10-CM | POA: Diagnosis not present

## 2017-05-23 ENCOUNTER — Ambulatory Visit (INDEPENDENT_AMBULATORY_CARE_PROVIDER_SITE_OTHER): Payer: Medicare Other | Admitting: *Deleted

## 2017-05-23 DIAGNOSIS — I639 Cerebral infarction, unspecified: Secondary | ICD-10-CM

## 2017-05-23 DIAGNOSIS — E785 Hyperlipidemia, unspecified: Secondary | ICD-10-CM | POA: Diagnosis not present

## 2017-05-23 DIAGNOSIS — J441 Chronic obstructive pulmonary disease with (acute) exacerbation: Secondary | ICD-10-CM | POA: Diagnosis not present

## 2017-05-23 DIAGNOSIS — H269 Unspecified cataract: Secondary | ICD-10-CM | POA: Diagnosis not present

## 2017-05-23 DIAGNOSIS — J9621 Acute and chronic respiratory failure with hypoxia: Secondary | ICD-10-CM | POA: Diagnosis not present

## 2017-05-23 DIAGNOSIS — F17210 Nicotine dependence, cigarettes, uncomplicated: Secondary | ICD-10-CM | POA: Diagnosis not present

## 2017-05-23 DIAGNOSIS — I10 Essential (primary) hypertension: Secondary | ICD-10-CM | POA: Diagnosis not present

## 2017-05-24 NOTE — Progress Notes (Signed)
Carelink Summary Report / Loop Recorder 

## 2017-05-25 DIAGNOSIS — J441 Chronic obstructive pulmonary disease with (acute) exacerbation: Secondary | ICD-10-CM | POA: Diagnosis not present

## 2017-05-25 DIAGNOSIS — H269 Unspecified cataract: Secondary | ICD-10-CM | POA: Diagnosis not present

## 2017-05-25 DIAGNOSIS — F17210 Nicotine dependence, cigarettes, uncomplicated: Secondary | ICD-10-CM | POA: Diagnosis not present

## 2017-05-25 DIAGNOSIS — I10 Essential (primary) hypertension: Secondary | ICD-10-CM | POA: Diagnosis not present

## 2017-05-25 DIAGNOSIS — E785 Hyperlipidemia, unspecified: Secondary | ICD-10-CM | POA: Diagnosis not present

## 2017-05-25 DIAGNOSIS — J9621 Acute and chronic respiratory failure with hypoxia: Secondary | ICD-10-CM | POA: Diagnosis not present

## 2017-05-29 LAB — CUP PACEART REMOTE DEVICE CHECK
Date Time Interrogation Session: 20190321023621
Implantable Pulse Generator Implant Date: 20180220

## 2017-05-30 DIAGNOSIS — I10 Essential (primary) hypertension: Secondary | ICD-10-CM | POA: Diagnosis not present

## 2017-05-30 DIAGNOSIS — H269 Unspecified cataract: Secondary | ICD-10-CM | POA: Diagnosis not present

## 2017-05-30 DIAGNOSIS — F17210 Nicotine dependence, cigarettes, uncomplicated: Secondary | ICD-10-CM | POA: Diagnosis not present

## 2017-05-30 DIAGNOSIS — E785 Hyperlipidemia, unspecified: Secondary | ICD-10-CM | POA: Diagnosis not present

## 2017-05-30 DIAGNOSIS — J9621 Acute and chronic respiratory failure with hypoxia: Secondary | ICD-10-CM | POA: Diagnosis not present

## 2017-05-30 DIAGNOSIS — J441 Chronic obstructive pulmonary disease with (acute) exacerbation: Secondary | ICD-10-CM | POA: Diagnosis not present

## 2017-06-02 DIAGNOSIS — H269 Unspecified cataract: Secondary | ICD-10-CM | POA: Diagnosis not present

## 2017-06-02 DIAGNOSIS — J9621 Acute and chronic respiratory failure with hypoxia: Secondary | ICD-10-CM | POA: Diagnosis not present

## 2017-06-02 DIAGNOSIS — E785 Hyperlipidemia, unspecified: Secondary | ICD-10-CM | POA: Diagnosis not present

## 2017-06-02 DIAGNOSIS — I10 Essential (primary) hypertension: Secondary | ICD-10-CM | POA: Diagnosis not present

## 2017-06-02 DIAGNOSIS — F17210 Nicotine dependence, cigarettes, uncomplicated: Secondary | ICD-10-CM | POA: Diagnosis not present

## 2017-06-02 DIAGNOSIS — J441 Chronic obstructive pulmonary disease with (acute) exacerbation: Secondary | ICD-10-CM | POA: Diagnosis not present

## 2017-06-10 ENCOUNTER — Ambulatory Visit (INDEPENDENT_AMBULATORY_CARE_PROVIDER_SITE_OTHER): Payer: Medicare Other | Admitting: Internal Medicine

## 2017-06-10 ENCOUNTER — Encounter: Payer: Self-pay | Admitting: Internal Medicine

## 2017-06-10 VITALS — BP 136/90 | HR 87 | Temp 97.6°F | Ht 61.0 in | Wt 192.4 lb

## 2017-06-10 DIAGNOSIS — J449 Chronic obstructive pulmonary disease, unspecified: Secondary | ICD-10-CM | POA: Diagnosis not present

## 2017-06-10 DIAGNOSIS — Z1159 Encounter for screening for other viral diseases: Secondary | ICD-10-CM

## 2017-06-10 DIAGNOSIS — R413 Other amnesia: Secondary | ICD-10-CM | POA: Insufficient documentation

## 2017-06-10 DIAGNOSIS — N6002 Solitary cyst of left breast: Secondary | ICD-10-CM | POA: Insufficient documentation

## 2017-06-10 DIAGNOSIS — Z13818 Encounter for screening for other digestive system disorders: Secondary | ICD-10-CM | POA: Diagnosis not present

## 2017-06-10 DIAGNOSIS — E538 Deficiency of other specified B group vitamins: Secondary | ICD-10-CM | POA: Diagnosis not present

## 2017-06-10 DIAGNOSIS — R739 Hyperglycemia, unspecified: Secondary | ICD-10-CM

## 2017-06-10 DIAGNOSIS — E785 Hyperlipidemia, unspecified: Secondary | ICD-10-CM | POA: Diagnosis not present

## 2017-06-10 DIAGNOSIS — Z1211 Encounter for screening for malignant neoplasm of colon: Secondary | ICD-10-CM | POA: Diagnosis not present

## 2017-06-10 DIAGNOSIS — Z720 Tobacco use: Secondary | ICD-10-CM

## 2017-06-10 DIAGNOSIS — I639 Cerebral infarction, unspecified: Secondary | ICD-10-CM

## 2017-06-10 DIAGNOSIS — I10 Essential (primary) hypertension: Secondary | ICD-10-CM

## 2017-06-10 DIAGNOSIS — M8580 Other specified disorders of bone density and structure, unspecified site: Secondary | ICD-10-CM

## 2017-06-10 DIAGNOSIS — R946 Abnormal results of thyroid function studies: Secondary | ICD-10-CM

## 2017-06-10 DIAGNOSIS — E559 Vitamin D deficiency, unspecified: Secondary | ICD-10-CM | POA: Diagnosis not present

## 2017-06-10 MED ORDER — DONEPEZIL HCL 5 MG PO TABS: 5.0000 mg | ORAL_TABLET | Freq: Every day | ORAL | 1 refills | Status: DC

## 2017-06-10 MED ORDER — LEVOFLOXACIN 750 MG PO TABS: 750.0000 mg | ORAL_TABLET | Freq: Every day | ORAL | 0 refills | Status: DC

## 2017-06-10 MED ORDER — LISINOPRIL 20 MG PO TABS: ORAL_TABLET | ORAL | 1 refills | Status: DC

## 2017-06-10 MED ORDER — ATORVASTATIN CALCIUM 40 MG PO TABS: 40.0000 mg | ORAL_TABLET | Freq: Every day | ORAL | 1 refills | Status: DC

## 2017-06-10 MED ORDER — FLUTICASONE-SALMETEROL 250-50 MCG/DOSE IN AEPB: 1.0000 | INHALATION_SPRAY | Freq: Two times a day (BID) | RESPIRATORY_TRACT | 11 refills | Status: DC

## 2017-06-10 MED ORDER — HYDROCHLOROTHIAZIDE 25 MG PO TABS: 25.0000 mg | ORAL_TABLET | Freq: Every day | ORAL | 1 refills | Status: DC

## 2017-06-10 MED ORDER — IPRATROPIUM-ALBUTEROL 0.5-2.5 (3) MG/3ML IN SOLN: 3.0000 mL | Freq: Four times a day (QID) | RESPIRATORY_TRACT | 11 refills | Status: DC

## 2017-06-10 NOTE — Addendum Note (Signed)
Addended by: Penne Lash on: 06/10/2017 10:38 AM   Modules accepted: Orders

## 2017-06-10 NOTE — Patient Instructions (Addendum)
Please sch mammogram  We referred you to the lung doctor   Chronic Obstructive Pulmonary Disease Chronic obstructive pulmonary disease (COPD) is a long-term (chronic) condition that affects the lungs. COPD is a general term that can be used to describe many different lung problems that cause lung swelling (inflammation) and limit airflow, including chronic bronchitis and emphysema. If you have COPD, your lung function will probably never return to normal. In most cases, it gets worse over time. However, there are steps you can take to slow the progression of the disease and improve your quality of life. What are the causes? This condition may be caused by:  Smoking. This is the most common cause.  Certain genes passed down through families.  What increases the risk? The following factors may make you more likely to develop this condition:  Secondhand smoke from cigarettes, pipes, or cigars.  Exposure to chemicals and other irritants such as fumes and dust in the work environment.  Chronic lung conditions or infections.  What are the signs or symptoms? Symptoms of this condition include:  Shortness of breath, especially during physical activity.  Chronic cough with a large amount of thick mucus. Sometimes the cough may not have any mucus (dry cough).  Wheezing.  Rapid breaths.  Gray or bluish discoloration (cyanosis) of the skin, especially in your fingers, toes, or lips.  Feeling tired (fatigue).  Weight loss.  Chest tightness.  Frequent infections.  Episodes when breathing symptoms become much worse (exacerbations).  Swelling in the ankles, feet, or legs. This may occur in later stages of the disease.  How is this diagnosed? This condition is diagnosed based on:  Your medical history.  A physical exam.  You may also have tests, including:  Lung (pulmonary) function tests. This may include a spirometry test, which measures your ability to exhale  properly.  Chest X-ray.  CT scan.  Blood tests.  How is this treated? This condition may be treated with:  Medicines. These may include inhaled rescue medicines to treat acute exacerbations as well as long-term, or maintenance, medicines to prevent flare-ups of COPD. ? Bronchodilators help treat COPD by dilating the airways to allow increased airflow and make your breathing more comfortable. ? Steroids can reduce airway inflammation and help prevent exacerbations.  Smoking cessation. If you smoke, your health care provider may ask you to quit, and may also recommend therapy or replacement products to help you quit.  Pulmonary rehabilitation. This may involve working with a team of health care providers and specialists, such as respiratory, occupational, and physical therapists.  Exercise and physical activity. These are beneficial for nearly all people with COPD.  Nutrition therapy to gain weight, if you are underweight.  Oxygen. Supplemental oxygen therapy is only helpful if you have a low oxygen level in your blood (hypoxemia).  Lung surgery or transplant.  Palliative care. This is to help people with COPD feel comfortable when treatment is no longer working.  Follow these instructions at home: Medicines  Take over-the-counter and prescription medicines (inhaled or pills) only as told by your health care provider.  Talk to your health care provider before taking any cough or allergy medicines. You may need to avoid certain medicines that dry out your airways. Lifestyle  If you are a smoker, the most important thing that you can do is to stop smoking. Do not use any products that contain nicotine or tobacco, such as cigarettes and e-cigarettes. If you need help quitting, ask your health care provider.  Continuing to smoke will cause the disease to progress faster.  Avoid exposure to things that irritate your lungs, such as smoke, chemicals, and fumes.  Stay active, but  balance activity with periods of rest. Exercise and physical activity will help you maintain your ability to do things you want to do.  Learn and use relaxation techniques to manage stress and to control your breathing.  Get the right amount of sleep and get quality sleep. Most adults need 7 or more hours per night.  Eat healthy foods. Eating smaller, more frequent meals and resting before meals may help you maintain your strength. Controlled breathing Learn and use controlled breathing techniques as directed by your health care provider. Controlled breathing techniques include:  Pursed lip breathing. Start by breathing in (inhaling) through your nose for 1 second. Then, purse your lips as if you were going to whistle and breathe out (exhale) through the pursed lips for 2 seconds.  Diaphragmatic breathing. Start by putting one hand on your abdomen just above your waist. Inhale slowly through your nose. The hand on your abdomen should move out. Then purse your lips and exhale slowly. You should be able to feel the hand on your abdomen moving in as you exhale.  Controlled coughing Learn and use controlled coughing to clear mucus from your lungs. Controlled coughing is a series of short, progressive coughs. The steps of controlled coughing are: 1. Lean your head slightly forward. 2. Breathe in deeply using diaphragmatic breathing. 3. Try to hold your breath for 3 seconds. 4. Keep your mouth slightly open while coughing twice. 5. Spit any mucus out into a tissue. 6. Rest and repeat the steps once or twice as needed.  General instructions  Make sure you receive all the vaccines that your health care provider recommends, especially the pneumococcal and influenza vaccines. Preventing infection and hospitalization is very important when you have COPD.  Use oxygen therapy and pulmonary rehabilitation if directed to by your health care provider. If you require home oxygen therapy, ask your health  care provider whether you should purchase a pulse oximeter to measure your oxygen level at home.  Work with your health care provider to develop a COPD action plan. This will help you know what steps to take if your condition gets worse.  Keep other chronic health conditions under control as told by your health care provider.  Avoid extreme temperature and humidity changes.  Avoid contact with people who have an illness that spreads from person to person (is contagious), such as viral infections or pneumonia.  Keep all follow-up visits as told by your health care provider. This is important. Contact a health care provider if:  You are coughing up more mucus than usual.  There is a change in the color or thickness of your mucus.  Your breathing is more labored than usual.  Your breathing is faster than usual.  You have difficulty sleeping.  You need to use your rescue medicines or inhalers more often than expected.  You have trouble doing routine activities such as getting dressed or walking around the house. Get help right away if:  You have shortness of breath while you are resting.  You have shortness of breath that prevents you from: ? Being able to talk. ? Performing your usual physical activities.  You have chest pain lasting longer than 5 minutes.  Your skin color is more blue (cyanotic) than usual.  You measure low oxygen saturations for longer than 5 minutes with  a pulse oximeter.  You have a fever.  You feel too tired to breathe normally. Summary  Chronic obstructive pulmonary disease (COPD) is a long-term (chronic) condition that affects the lungs.  Your lung function will probably never return to normal. In most cases, it gets worse over time. However, there are steps you can take to slow the progression of the disease and improve your quality of life.  Treatment for COPD may include taking medicines, quitting smoking, pulmonary rehabilitation, and changes  to diet and exercise. As the disease progresses, you may need oxygen therapy, a lung transplant, or palliative care.  To help manage your condition, do not smoke, avoid exposure to things that irritate your lungs, stay up to date on all vaccines, and follow your health care provider's instructions for taking medicines. This information is not intended to replace advice given to you by your health care provider. Make sure you discuss any questions you have with your health care provider. Document Released: 10/28/2004 Document Revised: 02/23/2016 Document Reviewed: 02/23/2016 Elsevier Interactive Patient Education  [employer].

## 2017-06-10 NOTE — Progress Notes (Signed)
Pre visit review using our clinic review tool, if applicable. No additional management support is needed unless otherwise documented below in the visit note. 

## 2017-06-10 NOTE — Progress Notes (Signed)
Chief Complaint  Patient presents with  . Establish Care   Establish care with daughter Lawerance Bach  1. COPD needs O2 continuous renewal O2 walking w/o O2 was 85 % with exertion and 90 % at rest and with 2L O2 walking 93% and 90% sitting with 2L o2  She is only on prn Albuterol and duoneb in past was on advair spiriva was too expensive. Pt is still smoking 1.5 ppd  2. HTN controlled on lis 20, hctz 25 needs refills  3. Daughter reports memory problems short term pt does have h/o stroke    Review of Systems  Constitutional: Negative for weight loss.  HENT: Negative for hearing loss.   Eyes: Negative for blurred vision.  Respiratory: Positive for cough, shortness of breath and wheezing.   Cardiovascular: Negative for chest pain.  Musculoskeletal: Negative for falls.  Skin: Negative for rash.  Neurological: Negative for headaches.  Psychiatric/Behavioral: Positive for memory loss. Negative for depression.   Past Medical History:  Diagnosis Date  . Asthma   . Cataract   . COPD (chronic obstructive pulmonary disease) ([place])   . GERD (gastroesophageal reflux disease)   . Hypertension   . Smoker    Past Surgical History:  Procedure Laterality Date  . CESAREAN SECTION  1971/1976  . HERNIA REPAIR  2013  . LOOP RECORDER INSERTION N/A 03/23/2016   Procedure: Loop Recorder Insertion;  Surgeon: Evans Lance, MD;  Location: [facility];  Service: Cardiovascular;  [name];  . PARTIAL HYMENECTOMY    . TEE WITHOUT CARDIOVERSION N/A 03/23/2016   Procedure: TRANSESOPHAGEAL ECHOCARDIOGRAM (TEE);  Surgeon: Skeet Latch, MD;  Location: [facility] ENDOSCOPY;  Service: Cardiovascular;  [name];   Family History  Problem Relation Age of Onset  . Diabetes Mother   . Hypertension Mother   . Miscarriages / [place] Mother   . Heart disease Father   . Diabetes Father   . Hypertension Father   . Cancer Sister        skin  . Diabetes Other    Social History   Socioeconomic  History  . Marital status: Widowed    Spouse name: Not on file  . Number of children: Not on file  . Years of education: Not on file  . Highest education level: Not on file  Occupational History  . Not on file  Social Needs  . Financial resource strain: Not on file  . Food insecurity:    Worry: Not on file    Inability: Not on file  . Transportation needs:    Medical: Not on file    Non-medical: Not on file  Tobacco Use  . Smoking status: Current Some Day Smoker    Packs/day: 0.50    Years: 30.00    Pack years: 15.00    Types: Cigarettes  . Smokeless tobacco: Former Systems developer    Quit date: 05/29/2015  Substance and Sexual Activity  . Alcohol use: Yes    Alcohol/week: 0.0 oz    Comment: occasionally  . Drug use: No  . Sexual activity: Not Currently    Birth control/protection: None  Lifestyle  . Physical activity:    Days per week: Not on file    Minutes per session: Not on file  . Stress: Not on file  Relationships  . Social connections:    Talks on phone: Not on file    Gets together: Not on file    Attends religious service: Not on file    Active member  of club or organization: Not on file    Attends meetings of clubs or organizations: Not on file    Relationship status: Not on file  . Intimate partner violence:    Fear of current or ex partner: Not on file    Emotionally abused: Not on file    Physically abused: Not on file    Forced sexual activity: Not on file  Other Topics Concern  . Not on file  Social History Narrative  . Not on file   Current Meds  Medication Sig  . albuterol (PROVENTIL HFA;[name] [name]  . aspirin EC 81 MG EC tablet [name]  . atorvastatin (LIPITOR) 40 MG tablet [name]  . hydrochlorothiazide (HYDRODIURIL) 25 MG tablet TAKE ONE TABLET  BY MOUTH ONCE DAILY  . ipratropium-albuterol (DUONEB) 0.5-2.5 (3) MG/3ML SOLN [name]  . lisinopril (PRINIVIL,[name] [name]   Allergies  Allergen Reactions  . Prednisone Rash and Other (See Comments)    Arrhythmia, also (Has tolerated hydrocortisone and decadron)    Recent Results (from the past 2160 hour(s))  CUP PACEART REMOTE DEVICE CHECK     Status: None   Collection Time: 03/19/17  2:44 AM  Result Value Ref Range   Date Time Interrogation Session [id number]    Pulse Generator Manufacturer MERM    Pulse Gen Model LNQ11 Reveal LINQ    Pulse Gen Serial Number [id number]    Clinic Name [facility]    Implantable Pulse Generator Type ICM/ILR    Implantable Pulse Generator Implant Date 20180220    Eval Rhythm SR   Basic metabolic panel     Status: Abnormal   Collection Time: 04/12/17 11:40 AM  Result Value Ref Range   Sodium 137 135 - 145 mmol/L   Potassium 3.7 3.5 - 5.1 mmol/L   Chloride 100 (L) 101 - 111 mmol/L   CO2 24 22 - 32 mmol/L   Glucose, Bld 128 (H) 65 - 99 mg/dL   BUN 15 6 - 20 mg/dL   Creatinine, Ser 0.98 0.44 - 1.00 mg/dL   Calcium 9.3 8.9 - 10.3 mg/dL   GFR calc non Af Amer 59 (L) >60 mL/min   GFR calc Af Amer >60 >60 mL/min    Comment: (NOTE) The eGFR has been calculated using the CKD EPI equation. This calculation has not been validated in all clinical situations. eGFR's persistently <60 mL/min signify possible Chronic Kidney Disease.    Anion gap 13 5 - 15    Comment: Performed at [facility], [address] [place], [place] [address]  CBC     Status: Abnormal   Collection Time: 04/12/17 11:40 AM  Result Value Ref Range   WBC 8.4 3.6 - 11.0 K/uL   RBC 5.95 (H) 3.80 - 5.20 MIL/uL   Hemoglobin 16.4 (H) 12.0 - 16.0 g/dL   HCT 49.0 (H) 35.0 - 47.0 %   MCV 82.3 80.0 - 100.0 fL   MCH 27.5 26.0 - 34.0 pg   MCHC 33.4 32.0 - 36.0 g/dL   RDW 15.4 (H)  11.5 - 14.5 %   Platelets 286 150 - 440 K/uL    Comment: Performed at [facility], [address] [place], [place] [address]  Troponin I     Status: None   Collection Time: 04/12/17 11:40 AM  Result Value Ref Range   Troponin I <0.03 <0.03 ng/mL    Comment: Performed at [facility], [address] [place], [place] [address]  Influenza panel by PCR (type A & B)     Status: None   Collection Time: 04/12/17  1:44 PM  Result Value Ref Range   Influenza A By PCR NEGATIVE NEGATIVE   Influenza B By PCR NEGATIVE NEGATIVE    Comment: (NOTE) The Xpert Xpress Flu assay is intended as an aid in the diagnosis of  influenza and should not be used as a sole basis for treatment.  This  assay is FDA approved for nasopharyngeal swab specimens only. Nasal  washings and aspirates are unacceptable for Xpert Xpress Flu testing. Performed at [facility], [address] [place], [place] [address]   Basic metabolic panel     Status: Abnormal   Collection Time: 04/13/17  4:07 AM  Result Value Ref Range   Sodium 135 135 - 145 mmol/L   Potassium 3.5 3.5 - 5.1 mmol/L   Chloride 101 101 - 111 mmol/L   CO2 21 (L) 22 - 32 mmol/L   Glucose, Bld 179 (H) 65 - 99 mg/dL   BUN 28 (H) 6 - 20 mg/dL   Creatinine, Ser 1.10 (H) 0.44 - 1.00 mg/dL   Calcium 9.5 8.9 - 10.3 mg/dL   GFR calc non Af Amer 51 (L) >60 mL/min   GFR calc Af Amer 59 (L) >60 mL/min    Comment: (NOTE) The eGFR has been calculated using the CKD EPI equation. This calculation has not been validated in all clinical situations. eGFR's persistently <60 mL/min signify possible Chronic Kidney Disease.    Anion gap 13 5 - 15    Comment: Performed at [facility], [address] [place], [place] [address]  CBC     Status: Abnormal   Collection Time: 04/13/17  4:07 AM  Result Value Ref Range   WBC 9.6 3.6 - 11.0 K/uL   RBC 5.08 3.80 - 5.20 MIL/uL   Hemoglobin 14.1 12.0 - 16.0 g/dL   HCT 41.3 35.0 - 47.0 %    MCV 81.4 80.0 - 100.0 fL   MCH 27.7 26.0 - 34.0 pg   MCHC 34.0 32.0 - 36.0 g/dL   RDW 15.3 (H) 11.5 - 14.5 %   Platelets 283 150 - 440 K/uL    Comment: Performed at [facility], [address] , [place] [address]  CUP PACEART REMOTE DEVICE CHECK     Status: None   Collection Time: 04/21/17  2:36 AM  Result Value Ref Range   Date Time Interrogation Session [id number]    Pulse Generator Manufacturer MERM    Pulse Gen Model LNQ11 Reveal LINQ    Pulse Gen Serial Number [id number]    Clinic Name [facility]    Implantable Pulse Generator Type ICM/ILR    Implantable Pulse Generator Implant Date 20180220    Eval Rhythm SR    Objective  Body mass index is 36.35 kg/m. Wt Readings from Last 3 Encounters:  06/10/17 192 lb 6.4 oz (87.3 kg)  04/12/17 194 lb (88 kg)  01/30/17 180 lb (81.6 kg)   Temp Readings from Last 3 Encounters:  06/10/17 97.6 F (36.4 C) (Oral)  04/15/17 98 F (36.7 C) (Oral)  01/30/17 (!) 96.8 F (  36 C) (Axillary)   BP Readings from Last 3 Encounters:  06/10/17 136/90  04/15/17 138/69  01/31/17 (!) 165/95   Pulse Readings from Last 3 Encounters:  06/10/17 87  04/15/17 74  01/31/17 75    Physical Exam  Constitutional: She is oriented to person, place, and time. She appears well-developed and well-nourished. She is cooperative.  HENT:  Head: Normocephalic and atraumatic.  Mouth/Throat: Oropharynx is clear and moist and mucous membranes are normal.  Eyes: Pupils are equal, round, and reactive to light. Conjunctivae are normal.  Cardiovascular: Normal rate, regular rhythm and normal heart sounds.  Pulmonary/Chest: She has wheezes.  Course breath sounds b/l doing well on 2L o2   Neurological: She is alert and oriented to person, place, and time. Gait normal.  Skin: Skin is warm, dry and intact.  Psychiatric: She has a normal mood and affect. Her speech is normal and behavior is normal. Judgment and thought content normal.  Cognition and memory are normal.  Nursing note and vitals reviewed.   Assessment   1. COPD on 2L O2  2. HTN  3. Memory problems with h/o stroke  4. HM Plan   1. See HPI O2 sat w/o o2 dropped to 85 % with exertion and with 2L O2 with walking 93 % and sitting w/o O2 90% and sitting with 2L O2 90%  Referral sent lung md needs pfts  Resume advair unable to afford spirva  Prn Albuterol and douneb  rec smoking cessation 1.5 ppd smoking since 30s  levaquin 750 x 5 days today  Renew continuous O2 unable to tolerate prednisone  2. Cont meds  3. Trial of aricept 5 mg qhs  mmse at f/u  4.  Had flu Had pna 23 prevnar due 04/16/2018  utd tdap  Declines shingrix and MMR check   Referred mammogram today  Referred cologaurd  Declines derm  S/p partial hysterectomy  DEXA 07/29/15 osteopenia    Labs today   Provider: Dr.  McLean-Scocuzza-Internal Medicine  

## 2017-06-11 LAB — URINALYSIS, ROUTINE W REFLEX MICROSCOPIC
Bilirubin, UA: NEGATIVE
GLUCOSE, UA: NEGATIVE
Ketones, UA: NEGATIVE
Leukocytes, UA: NEGATIVE
NITRITE UA: NEGATIVE
PH UA: 5 (ref 5.0–7.5)
PROTEIN UA: NEGATIVE
RBC, UA: NEGATIVE
Specific Gravity, UA: 1.028 (ref 1.005–1.030)
Urobilinogen, Ur: 0.2 mg/dL (ref 0.2–1.0)

## 2017-06-13 ENCOUNTER — Encounter: Payer: Self-pay | Admitting: Emergency Medicine

## 2017-06-13 ENCOUNTER — Other Ambulatory Visit: Payer: Self-pay

## 2017-06-13 DIAGNOSIS — R079 Chest pain, unspecified: Secondary | ICD-10-CM | POA: Insufficient documentation

## 2017-06-13 DIAGNOSIS — R0602 Shortness of breath: Secondary | ICD-10-CM | POA: Diagnosis not present

## 2017-06-13 DIAGNOSIS — Z5321 Procedure and treatment not carried out due to patient leaving prior to being seen by health care provider: Secondary | ICD-10-CM | POA: Insufficient documentation

## 2017-06-13 DIAGNOSIS — R05 Cough: Secondary | ICD-10-CM | POA: Insufficient documentation

## 2017-06-13 NOTE — ED Triage Notes (Signed)
Pt to triage via w/c with no distress noted, congested cough noted; pt reports having mid CP, nonradiating tonight accomp by [facility]

## 2017-06-14 ENCOUNTER — Emergency Department
Admission: EM | Admit: 2017-06-14 | Discharge: 2017-06-14 | Disposition: A | Discharge status: Home / Self Care | Payer: Medicare Other | Attending: Emergency Medicine | Admitting: Emergency Medicine

## 2017-06-14 ENCOUNTER — Emergency Department: Payer: Medicare Other

## 2017-06-14 DIAGNOSIS — R079 Chest pain, unspecified: Secondary | ICD-10-CM | POA: Diagnosis not present

## 2017-06-14 DIAGNOSIS — R0602 Shortness of breath: Secondary | ICD-10-CM | POA: Diagnosis not present

## 2017-06-14 LAB — CBC
HEMATOCRIT: 44.2 % (ref 35.0–47.0)
HEMOGLOBIN: 15.3 g/dL (ref 12.0–16.0)
MCH: 29.2 pg (ref 26.0–34.0)
MCHC: 34.5 g/dL (ref 32.0–36.0)
MCV: 84.5 fL (ref 80.0–100.0)
Platelets: 329 10*3/uL (ref 150–440)
RBC: 5.23 MIL/uL — AB (ref 3.80–5.20)
RDW: 15.9 % — ABNORMAL HIGH (ref 11.5–14.5)
WBC: 8.9 10*3/uL (ref 3.6–11.0)

## 2017-06-14 LAB — BASIC METABOLIC PANEL
ANION GAP: 8 (ref 5–15)
BUN: 18 mg/dL (ref 6–20)
CO2: 24 mmol/L (ref 22–32)
Calcium: 9.1 mg/dL (ref 8.9–10.3)
Chloride: 104 mmol/L (ref 101–111)
Creatinine, Ser: 1 mg/dL (ref 0.44–1.00)
GFR calc non Af Amer: 57 mL/min — ABNORMAL LOW (ref >60)
Glucose, Bld: 119 mg/dL — ABNORMAL HIGH (ref 65–99)
POTASSIUM: 3.5 mmol/L (ref 3.5–5.1)
Sodium: 136 mmol/L (ref 135–145)

## 2017-06-14 LAB — TROPONIN I: Troponin I: <0.03 ng/mL (ref <0.03)

## 2017-06-17 ENCOUNTER — Other Ambulatory Visit

## 2017-06-20 LAB — CUP PACEART REMOTE DEVICE CHECK
MDC IDC PG IMPLANT DT: 20180220
MDC IDC SESS DTM: 20190423033531

## 2017-06-21 NOTE — Progress Notes (Signed)
[name], [name], [name], [name], [name], as well as her previous attempts at quitting.  Currently she does not appear to be serious about quitting.  Spent 3 minutes in discussion.  Return in about 6 months (around 12/23/2017).   Date: 06/22/2017  MRN# [id number] Lauren Koch 1950-09-11   Lauren Koch is a 67 y.o. old female seen in consultation for chief complaint of:    Chief Complaint  Patient presents with  . Consult    Referred by French Ana M-Scocuzza for eval of COPD: she states Carmie End is too expensive.  . Cough    pt is a current smoker 1.5 pack per day.   . Wheezing  . Shortness of Breath    HPI:   The patient is a 67 year old female smoker, she is a history of oxygen dependent COPD.  She was admitted to the hospital on 04/12/2017 for 3 days for COPD exacerbation. She is present with her daughter today who gives most of the history.  Pt notes that she is winded and constantly wheezing, she has been doing this for several months for the wheezing, [name], and advair twice per day, [name], [name], [name], [name], she is a homebody, does not work outside. When she goes to [employer] with daughter she uses motorized cart.  She is on oxygen at 2L at home but she does not wear it.    Desat walk 06/22/17; baseline sat at rest on RA was 91% and HR 63. Walked 180 feet with moderate dyspnea and audible wheezing. Sat is 93% and HR 82.   **Spirometry 06/22/2017 tracings personally reviewed, [name], [name], results are consistent with severe emphysema.  Imaging personally reviewed, CT chest 04/12/2017, as well as other recent imaging; reduced lung volumes due to obesity; mild bibasilar atelectatic changes, otherwise normal lungs.  Large pulmonary artery suggestive of pulmonary hypertension.   PMHX:   Past Medical History:  Diagnosis Date  . Asthma   . Cataract   . COPD (chronic obstructive pulmonary disease) ([facility])   . GERD (gastroesophageal reflux disease)   . Hypertension   . Smoker   . Stroke [facility])    Surgical Hx:  Past Surgical History:  Procedure Laterality Date  . ABDOMINAL HYSTERECTOMY     partial   . CESAREAN SECTION  1971/1976  . HERNIA REPAIR  2013  . LOOP RECORDER INSERTION N/A 03/23/2016   Procedure:  Loop Recorder Insertion;  [name], MD;  [name];  Service: Cardiovascular;  [name];  . PARTIAL HYMENECTOMY    . TEE WITHOUT CARDIOVERSION N/A 03/23/2016   Procedure: TRANSESOPHAGEAL ECHOCARDIOGRAM (TEE);  [name], MD;  [name];  Service: Cardiovascular;  [name];   Family Hx:  Family History  Problem Relation Age of Onset  . Diabetes Mother   . Hypertension Mother   . Miscarriages / [place] Mother   . Hyperlipidemia Mother   . Heart disease Father   . Diabetes Father   . Hypertension Father   . Hyperlipidemia Father   . Stroke  Father   . Cancer Sister        skin  . Diabetes Other   . Hyperlipidemia Daughter    Social Hx:   Social History   Tobacco Use  . Smoking status: Current Some Day Smoker    Packs/day: 0.50    Years: 30.00    Pack years: 15.00    Types: Cigarettes  . Smokeless tobacco: Former Neurosurgeon    Quit date: 05/29/2015  Substance Use Topics  . Alcohol use: Yes    Alcohol/week: 0.0 oz    Comment: occasionally  . Drug use: No   Medication:    Current Outpatient Medications:  .  albuterol (PROVENTIL HFA;[name], [name], [name], [name], [name], [name] , [name], [name], [name], [name], [name], [name], [name], Inhale 1 puff into the lungs 2 (two) times daily. Rinse mouth, [name], [name], [name], [name], [name], [name], [name], [name], [name], [name], [name],[name], [name], [name], [name], sweats, [name], double vision. bleeds, [name], [name], [name], [name], [name], [name],  [name], [name], insomnia. Other:  All other systems were reviewed with the patient and were negative other that what is mentioned in the HPI.   Physical Examination:   VS: BP 118/62 (BP Location: Left Arm, Cuff Size: Large)   Pulse 61   Resp 16   Ht  (1.549 m)   Wt 190 lb (86.2 kg)   SpO2 95%   BMI 35.90 kg/m   General Appearance: Loud raspy breathing Neuro:without focal findings,  speech normal,  HEENT: PERRLA, EOM intact.   Pulmonary: Loud raspy breathing, bilateral diffuse wheezing. CardiovascularNormal S1,S2.  No m/r/g.   Abdomen: Benign, Soft, non-tender. Renal:  No costovertebral tenderness  GU:  No performed at this time. Endoc: No evident thyromegaly, no signs of acromegaly. Skin:   warm, no rashes, no ecchymosis  Extremities: normal, no cyanosis, clubbing.  Other findings:    LABORATORY PANEL:   CBC No results for input(s): WBC, HGB, HCT, PLT in the last 168 hours. ------------------------------------------------------------------------------------------------------------------  Chemistries  No results for input(s): NA, K, CL, CO2, GLUCOSE, BUN, CREATININE, CALCIUM, MG, AST, ALT, ALKPHOS, BILITOT in the last 168 hours.  Invalid input(s): GFRCGP ------------------------------------------------------------------------------------------------------------------  Cardiac Enzymes No results for input(s): TROPONINI in the last 168 hours. ------------------------------------------------------------  RADIOLOGY:  No results found.     Thank  you for the consultation and for allowing [facility] to assist in the care of your patient. Our recommendations are noted above.  Please contact us if we can be of further service.   Wells Guiles, MD.  Board Certified in Internal Medicine, Pulmonary  Medicine, Critical Care Medicine, and Sleep Medicine.  [facility] Office Number: [phone]  Santiago Glad, M.D.  Billy Fischer, M.D  06/22/2017

## 2017-06-22 ENCOUNTER — Ambulatory Visit (INDEPENDENT_AMBULATORY_CARE_PROVIDER_SITE_OTHER): Payer: Medicare Other | Admitting: Internal Medicine

## 2017-06-22 ENCOUNTER — Telehealth: Payer: Self-pay | Admitting: *Deleted

## 2017-06-22 ENCOUNTER — Encounter: Payer: Self-pay | Admitting: Internal Medicine

## 2017-06-22 VITALS — BP 118/62 | HR 61 | Resp 16 | Ht 61.0 in | Wt 190.0 lb

## 2017-06-22 DIAGNOSIS — I639 Cerebral infarction, unspecified: Secondary | ICD-10-CM

## 2017-06-22 DIAGNOSIS — J449 Chronic obstructive pulmonary disease, unspecified: Secondary | ICD-10-CM

## 2017-06-22 NOTE — Patient Instructions (Signed)
Use Advair twice daily, and use spiriva once daily.  Use the nebulizers twice daily or more if needed.

## 2017-06-22 NOTE — Telephone Encounter (Signed)
error 

## 2017-06-23 ENCOUNTER — Encounter: Payer: Self-pay | Admitting: *Deleted

## 2017-06-23 ENCOUNTER — Emergency Department: Payer: Medicare Other

## 2017-06-23 ENCOUNTER — Other Ambulatory Visit: Payer: Self-pay

## 2017-06-23 ENCOUNTER — Observation Stay
Admission: EM | Admit: 2017-06-23 | Discharge: 2017-06-25 | Disposition: A | Discharge status: Home / Self Care | Payer: Medicare Other | Attending: Internal Medicine | Admitting: Internal Medicine

## 2017-06-23 DIAGNOSIS — Z6838 Body mass index (BMI) 38.0-38.9, adult: Secondary | ICD-10-CM | POA: Diagnosis not present

## 2017-06-23 DIAGNOSIS — Z888 Allergy status to other drugs, medicaments and biological substances status: Secondary | ICD-10-CM | POA: Diagnosis not present

## 2017-06-23 DIAGNOSIS — Z79899 Other long term (current) drug therapy: Secondary | ICD-10-CM | POA: Insufficient documentation

## 2017-06-23 DIAGNOSIS — F17210 Nicotine dependence, cigarettes, uncomplicated: Secondary | ICD-10-CM | POA: Diagnosis not present

## 2017-06-23 DIAGNOSIS — Z8489 Family history of other specified conditions: Secondary | ICD-10-CM | POA: Insufficient documentation

## 2017-06-23 DIAGNOSIS — Z8673 Personal history of transient ischemic attack (TIA), and cerebral infarction without residual deficits: Secondary | ICD-10-CM | POA: Insufficient documentation

## 2017-06-23 DIAGNOSIS — E785 Hyperlipidemia, unspecified: Secondary | ICD-10-CM | POA: Insufficient documentation

## 2017-06-23 DIAGNOSIS — Z7982 Long term (current) use of aspirin: Secondary | ICD-10-CM | POA: Insufficient documentation

## 2017-06-23 DIAGNOSIS — E559 Vitamin D deficiency, unspecified: Secondary | ICD-10-CM | POA: Insufficient documentation

## 2017-06-23 DIAGNOSIS — R55 Syncope and collapse: Secondary | ICD-10-CM | POA: Diagnosis not present

## 2017-06-23 DIAGNOSIS — E861 Hypovolemia: Secondary | ICD-10-CM | POA: Diagnosis not present

## 2017-06-23 DIAGNOSIS — Z7951 Long term (current) use of inhaled steroids: Secondary | ICD-10-CM | POA: Insufficient documentation

## 2017-06-23 DIAGNOSIS — Z823 Family history of stroke: Secondary | ICD-10-CM | POA: Diagnosis not present

## 2017-06-23 DIAGNOSIS — N179 Acute kidney failure, unspecified: Secondary | ICD-10-CM | POA: Insufficient documentation

## 2017-06-23 DIAGNOSIS — Z833 Family history of diabetes mellitus: Secondary | ICD-10-CM | POA: Insufficient documentation

## 2017-06-23 DIAGNOSIS — I10 Essential (primary) hypertension: Secondary | ICD-10-CM | POA: Diagnosis not present

## 2017-06-23 DIAGNOSIS — K219 Gastro-esophageal reflux disease without esophagitis: Secondary | ICD-10-CM | POA: Diagnosis not present

## 2017-06-23 DIAGNOSIS — J449 Chronic obstructive pulmonary disease, unspecified: Secondary | ICD-10-CM | POA: Diagnosis not present

## 2017-06-23 DIAGNOSIS — F0390 Unspecified dementia without behavioral disturbance: Secondary | ICD-10-CM | POA: Diagnosis not present

## 2017-06-23 DIAGNOSIS — Z8249 Family history of ischemic heart disease and other diseases of the circulatory system: Secondary | ICD-10-CM | POA: Insufficient documentation

## 2017-06-23 DIAGNOSIS — R413 Other amnesia: Secondary | ICD-10-CM | POA: Diagnosis not present

## 2017-06-23 DIAGNOSIS — R569 Unspecified convulsions: Secondary | ICD-10-CM | POA: Insufficient documentation

## 2017-06-23 DIAGNOSIS — M8580 Other specified disorders of bone density and structure, unspecified site: Secondary | ICD-10-CM | POA: Diagnosis not present

## 2017-06-23 DIAGNOSIS — R0602 Shortness of breath: Secondary | ICD-10-CM | POA: Diagnosis not present

## 2017-06-23 DIAGNOSIS — Z808 Family history of malignant neoplasm of other organs or systems: Secondary | ICD-10-CM | POA: Insufficient documentation

## 2017-06-23 DIAGNOSIS — Z90710 Acquired absence of both cervix and uterus: Secondary | ICD-10-CM | POA: Insufficient documentation

## 2017-06-23 HISTORY — DX: Other ill-defined heart diseases: I51.89

## 2017-06-23 HISTORY — DX: Unspecified dementia, unspecified severity, without behavioral disturbance, psychotic disturbance, mood disturbance, and anxiety: F03.90

## 2017-06-23 LAB — CBC WITH DIFFERENTIAL/PLATELET
BASOS ABS: 0 10*3/uL (ref 0–0.1)
Basophils Relative: 1 %
EOS ABS: 0.2 10*3/uL (ref 0–0.7)
EOS PCT: 3 %
HCT: 43.1 % (ref 35.0–47.0)
HEMOGLOBIN: 14.6 g/dL (ref 12.0–16.0)
LYMPHS ABS: 1.8 10*3/uL (ref 1.0–3.6)
LYMPHS PCT: 23 %
MCH: 29.3 pg (ref 26.0–34.0)
MCHC: 33.9 g/dL (ref 32.0–36.0)
MCV: 86.3 fL (ref 80.0–100.0)
Monocytes Absolute: 0.9 10*3/uL (ref 0.2–0.9)
Monocytes Relative: 11 %
NEUTROS PCT: 62 %
Neutro Abs: 5 10*3/uL (ref 1.4–6.5)
Platelets: 313 10*3/uL (ref 150–440)
RBC: 4.99 MIL/uL (ref 3.80–5.20)
RDW: 16 % — ABNORMAL HIGH (ref 11.5–14.5)
WBC: 8 10*3/uL (ref 3.6–11.0)

## 2017-06-23 LAB — BASIC METABOLIC PANEL
Anion gap: 9 (ref 5–15)
BUN: 14 mg/dL (ref 6–20)
CO2: 27 mmol/L (ref 22–32)
Calcium: 9.1 mg/dL (ref 8.9–10.3)
Chloride: 104 mmol/L (ref 101–111)
Creatinine, Ser: 1.17 mg/dL — ABNORMAL HIGH (ref 0.44–1.00)
GFR, EST AFRICAN AMERICAN: 55 mL/min — AB (ref >60)
GFR, EST NON AFRICAN AMERICAN: 47 mL/min — AB (ref >60)
Glucose, Bld: 127 mg/dL — ABNORMAL HIGH (ref 65–99)
Potassium: 3.7 mmol/L (ref 3.5–5.1)
SODIUM: 140 mmol/L (ref 135–145)

## 2017-06-23 MED ORDER — IPRATROPIUM-ALBUTEROL 0.5-2.5 (3) MG/3ML IN SOLN
3.0000 mL | Freq: Once | RESPIRATORY_TRACT | Status: AC
  Administered 2017-06-23: 3 mL via RESPIRATORY_TRACT
  Filled 2017-06-23: qty 3

## 2017-06-23 MED ORDER — SODIUM CHLORIDE 0.9 % IV BOLUS
1000.0000 mL | Freq: Once | INTRAVENOUS | Status: AC
  Administered 2017-06-23: 1000 mL via INTRAVENOUS

## 2017-06-23 MED ORDER — METHYLPREDNISOLONE SODIUM SUCC 125 MG IJ SOLR
125.0000 mg | Freq: Once | INTRAMUSCULAR | Status: AC
  Administered 2017-06-23: 125 mg via INTRAVENOUS
  Filled 2017-06-23: qty 2

## 2017-06-23 MED ORDER — LEVETIRACETAM IN NACL 1000 MG/100ML IV SOLN
1000.0000 mg | Freq: Once | INTRAVENOUS | Status: AC
  Administered 2017-06-24: 1000 mg via INTRAVENOUS
  Filled 2017-06-23: qty 100

## 2017-06-23 NOTE — ED Notes (Signed)
Family at bedside. State pt is normally acting at present

## 2017-06-23 NOTE — ED Triage Notes (Signed)
Pt had a witnessed syncopal episode tonight while out with family. Per EMS her BP was low 80/50's and tachycardia. Pt didn't initially want to come to the hospital but then did agree when VS were not improving.

## 2017-06-23 NOTE — ED Provider Notes (Signed)
[facility] Emergency Department Provider Note  ____________________________________________   First MD Initiated Contact with Patient 06/23/17 2315     (approximate)  I have reviewed the triage vital signs and the nursing notes.   HISTORY  Chief Complaint Loss of Consciousness  Level 5 exemption history limited by the patient's clinical condition  HPI Lauren Koch is a 67 y.o. female comes to the emergency department via EMS after a seizure versus a syncopal event.  She was outside standing with family this evening when her chin slumped down and she fell to the ground.  Patient reports tonic-clonic jerking like activity.  Patient is currently now back to her baseline.  She does have an implantable loop recorder in place secondary to a recent stroke.  The patient herself has a mild headache otherwise no complaints.  Past Medical History:  Diagnosis Date  . Asthma   . Cataract   . COPD (chronic obstructive pulmonary disease) (HCC)   . Cryptogenic Stroke (HCC)    a. 03/2016 L ACA, L MCX, L PCA, and R dorsal pontine territory infarcts; b. 03/2016 Carotid U/S: 1-39% bil dzs; c. 03/2016 TEE: EF 60-65%, no rwma, no LA/LAA/RA/RAA thrombus. No PFO; d. 03/2016 s/p MDT Reveal Linq (ser# [id number]) - no AFib noted to date (some sinus w/ freq PACs).  . Dementia   . Diastolic dysfunction    a. 03/2016 Echo: EF 60-65%, gr1 DD, nl RV fxn.  Marland Kitchen GERD (gastroesophageal reflux disease)   . Hypertension   . Smoker     Patient Active Problem List   Diagnosis Date Noted  . Syncope 06/24/2017  . Memory loss 06/10/2017  . Abnormal thyroid function test 06/10/2017  . Breast cyst, left 06/10/2017  . Hyperlipemia 03/25/2016  . 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 Osteopenia 08/08/2015  . Pulmonary emphysema (HCC)   . Hyperglycemia 05/30/2015  . Abnormal TSH 05/30/2015  . CAP (community acquired pneumonia) 05/29/2015  .  Acute respiratory failure with hypoxia (HCC) 05/29/2015  . COPD exacerbation (HCC) 05/29/2015  . Acute kidney injury (HCC) 05/29/2015  . Morbid obesity (HCC) 05/29/2015  . Tobacco abuse 05/29/2015  . Right leg pain 05/29/2015  . Essential hypertension 05/29/2015  . Vitamin D deficiency 04/17/2015  . Smoker 03/03/2015  . Asthma   . Cataract   . COPD (chronic obstructive pulmonary disease) (HCC)   . Hypertension   . GERD (gastroesophageal reflux disease)     Past Surgical History:  Procedure Laterality Date  . ABDOMINAL HYSTERECTOMY     partial   . CESAREAN SECTION  1971/1976  . HERNIA REPAIR  2013  . LOOP RECORDER INSERTION N/A 03/23/2016   Procedure: Loop Recorder Insertion;  [name], MD;  [name];  Service: Cardiovascular;  [name];  . PARTIAL HYMENECTOMY    . TEE WITHOUT CARDIOVERSION N/A 03/23/2016   Procedure: TRANSESOPHAGEAL ECHOCARDIOGRAM (TEE);  [name], MD;  Location: [facility] ENDOSCOPY;  Service: Cardiovascular;  [name];    Prior to Admission medications   Medication Sig Start Date End Date Taking? Authorizing Provider  albuterol (PROVENTIL HFA;[name] [name] 03/03/15  Yes Dorena Bodo, PA-C  aspirin EC 81 MG EC tablet [name] 04/15/17  Yes Adrian Saran, MD  atorvastatin (LIPITOR) 40 MG tablet [name] 06/10/17  Yes McLean-Scocuzza, Pasty Spillers, MD  donepezil (ARICEPT) 5 MG tablet [name] 06/10/17  Yes McLean-Scocuzza, Pasty Spillers, MD  Fluticasone-Salmeterol (ADVAIR DISKUS) 250-50 MCG/DOSE AEPB Inhale 1 puff into the lungs 2 (two) times daily. Rinse mouth 06/10/17  Yes McLean-Scocuzza, Pasty Spillers, MD  ipratropium-albuterol (DUONEB) 0.5-2.5 (3) MG/3ML SOLN Take 3 mLs by nebulization  every 6 (six) hours. 06/10/17  Yes McLean-Scocuzza, Pasty Spillers, MD  lisinopril (PRINIVIL,[name] [name] 06/10/17  Yes McLean-Scocuzza, Pasty Spillers, MD    Allergies Prednisone  Family History  Problem Relation Age of Onset  . Diabetes Mother   . Hypertension Mother   . Miscarriages / [place] Mother   . Hyperlipidemia Mother   . Heart disease Father   . Diabetes Father   . Hypertension Father   . Hyperlipidemia Father   . Stroke Father   . Cancer Sister        skin  . Diabetes Other   . Hyperlipidemia Daughter     Social History Social History   Tobacco Use  . Smoking status: Current Some Day Smoker    Packs/day: 1.00    Years: 30.00    Pack years: 30.00    Types: Cigarettes  . Smokeless tobacco: Former Neurosurgeon    Quit date: 05/29/2015  Substance Use Topics  . Alcohol use: Yes    Alcohol/week: 0.0 oz    Comment: occasionally  . Drug use: No    Review of Systems Level 5 exemption history limited the patient's clinical condition  ____________________________________________   PHYSICAL EXAM:  VITAL SIGNS: ED Triage Vitals  Enc Vitals Group     BP 06/23/17 2219 (!) 85/76     Pulse Rate 06/23/17 2219 64     Resp 06/23/17 2219 20     Temp 06/23/17 2219 97.6 F (36.4 C)     Temp src --      SpO2 06/23/17 2215 93 %     Weight 06/23/17 2221 190 lb (86.2 kg)     Height 06/23/17 2221  (1.549 m)     Head Circumference --      Peak Flow --      Pain Score 06/23/17 2221 0     Pain Loc --      Pain Edu? --      Excl. in GC? --     Constitutional: Pleasant cooperative no acute distress Eyes: PERRL EOMI. midrange and brisk Head: Atraumatic. Nose: No congestion/rhinnorhea. Mouth/Throat: No trismus Neck: No stridor.   Cardiovascular: Normal rate, regular rhythm. Grossly normal heart sounds.  Good peripheral circulation. Respiratory: Somewhat increased respiratory effort.  No retractions.  Diffuse expiratory wheezing  throughout lung sounds equal bilaterally Gastrointestinal: Soft nontender Musculoskeletal: No lower extremity edema   Neurologic:  Normal speech and language. No gross focal neurologic deficits are appreciated. Skin:  Skin is warm, dry and intact. No rash noted. Psychiatric: Mood and affect are normal. Speech and behavior are normal.    ____________________________________________   DIFFERENTIAL includes but not limited to  Cardiogenic syncope, vasovagal syncope, dehydration, seizure, dehydration ____________________________________________   LABS (all labs ordered are listed, but only abnormal results are displayed)  Labs Reviewed  BASIC METABOLIC PANEL - Abnormal; Notable for the following components:      Result Value   Glucose, Bld  127 (*)    Creatinine, Ser 1.17 (*)    GFR calc non Af Amer 47 (*)    GFR calc Af Amer 55 (*)    All other components within normal limits  CBC WITH DIFFERENTIAL/PLATELET - Abnormal; Notable for the following components:   RDW 16.0 (*)    All other components within normal limits  HEPATIC FUNCTION PANEL - Abnormal; Notable for the following components:   Albumin 3.4 (*)    Total Bilirubin 0.2 (*)    Bilirubin, Direct <0.1 (*)    All other components within normal limits  URINALYSIS, COMPLETE (UACMP) WITH MICROSCOPIC - Abnormal; Notable for the following components:   Color, Urine YELLOW (*)    APPearance CLEAR (*)    All other components within normal limits  BASIC METABOLIC PANEL - Abnormal; Notable for the following components:   CO2 21 (*)    Glucose, Bld 268 (*)    Creatinine, Ser 1.18 (*)    Calcium 8.6 (*)    GFR calc non Af Amer 47 (*)    GFR calc Af Amer 54 (*)    All other components within normal limits  GLUCOSE, CAPILLARY - Abnormal; Notable for the following components:   Glucose-Capillary 188 (*)    All other components within normal limits  GLUCOSE, CAPILLARY - Abnormal; Notable for the following components:    Glucose-Capillary 119 (*)    All other components within normal limits  GLUCOSE, CAPILLARY - Abnormal; Notable for the following components:   Glucose-Capillary 161 (*)    All other components within normal limits  GLUCOSE, CAPILLARY - Abnormal; Notable for the following components:   Glucose-Capillary 125 (*)    All other components within normal limits  BASIC METABOLIC PANEL - Abnormal; Notable for the following components:   Glucose, Bld 103 (*)    Calcium 8.4 (*)    All other components within normal limits  TROPONIN I  BRAIN NATRIURETIC PEPTIDE  HIV ANTIBODY (ROUTINE TESTING)  TROPONIN I  TROPONIN I  TROPONIN I  GLUCOSE, CAPILLARY  GLUCOSE, CAPILLARY    Lab work reviewed by me with no acute disease __________________________________________  EKG  ED ECG REPORT I, Merrily Brittle, the attending physician, personally viewed and interpreted this ECG.  Date: 06/23/2017 EKG Time:  Rate: 60 Rhythm: normal sinus rhythm QRS Axis: normal Intervals: normal ST/T Wave abnormalities: normal Narrative Interpretation: no evidence of acute ischemia  ____________________________________________  RADIOLOGY  Head CT reviewed by me with chronic changes but no acute disease ____________________________________________   PROCEDURES  Procedure(s) performed: no  Procedures  Critical Care performed: no  Observation: no ____________________________________________   INITIAL IMPRESSION / ASSESSMENT AND PLAN / ED COURSE  Pertinent labs & imaging results that were available during my care of the patient were reviewed by me and considered in my medical decision making (see chart for details).       ----------------------------------------- 11:24 PM on 06/23/2017 -----------------------------------------  While the patient is currently behaving normally it is difficult to assess out whether or not she had an arrhythmia versus a new seizure.  CT of the head, chest x-ray,  duo nebs, Solu-Medrol, and out urinalysis are pending.  We will interrogate her implanted device to evaluate for arrhythmia. ____________________________________________   The patient feels improved after DuoNeb's.  We attempted to interrogate her device but were unable to do so.  At this point this may represent a first lifetime seizure versus a cardiogenic event so the patient requires inpatient admission for telemetry  and full evaluation.   FINAL CLINICAL IMPRESSION(S) / ED DIAGNOSES  Final diagnoses:  Syncope and collapse  Seizure (HCC)      NEW MEDICATIONS STARTED DURING THIS VISIT:  Discharge Medication List as of 06/25/2017  2:34 PM       Note:  This document was prepared using Dragon voice recognition software and may include unintentional dictation errors.     Merrily Brittle, MD 06/27/17 [phone]

## 2017-06-24 ENCOUNTER — Other Ambulatory Visit: Payer: Self-pay

## 2017-06-24 ENCOUNTER — Observation Stay (HOSPITAL_BASED_OUTPATIENT_CLINIC_OR_DEPARTMENT_OTHER)
Admit: 2017-06-24 | Discharge: 2017-06-24 | Disposition: A | Discharge status: Home / Self Care | Payer: Medicare Other | Attending: Cardiovascular Disease | Admitting: Cardiovascular Disease

## 2017-06-24 ENCOUNTER — Other Ambulatory Visit

## 2017-06-24 ENCOUNTER — Encounter: Payer: Self-pay | Admitting: Internal Medicine

## 2017-06-24 DIAGNOSIS — J432 Centrilobular emphysema: Secondary | ICD-10-CM | POA: Diagnosis not present

## 2017-06-24 DIAGNOSIS — R739 Hyperglycemia, unspecified: Secondary | ICD-10-CM | POA: Diagnosis not present

## 2017-06-24 DIAGNOSIS — I9589 Other hypotension: Secondary | ICD-10-CM

## 2017-06-24 DIAGNOSIS — R7989 Other specified abnormal findings of blood chemistry: Secondary | ICD-10-CM | POA: Diagnosis not present

## 2017-06-24 DIAGNOSIS — I10 Essential (primary) hypertension: Secondary | ICD-10-CM | POA: Diagnosis not present

## 2017-06-24 DIAGNOSIS — I959 Hypotension, unspecified: Secondary | ICD-10-CM | POA: Diagnosis not present

## 2017-06-24 DIAGNOSIS — I6310 Cerebral infarction due to embolism of unspecified precerebral artery: Secondary | ICD-10-CM | POA: Diagnosis not present

## 2017-06-24 DIAGNOSIS — R55 Syncope and collapse: Secondary | ICD-10-CM

## 2017-06-24 DIAGNOSIS — E861 Hypovolemia: Secondary | ICD-10-CM

## 2017-06-24 HISTORY — DX: Syncope and collapse: R55

## 2017-06-24 LAB — URINALYSIS, COMPLETE (UACMP) WITH MICROSCOPIC
Bacteria, UA: NONE SEEN
Bilirubin Urine: NEGATIVE
Glucose, UA: NEGATIVE mg/dL
Hgb urine dipstick: NEGATIVE
Ketones, ur: NEGATIVE mg/dL
Leukocytes, UA: NEGATIVE
Nitrite: NEGATIVE
PROTEIN: NEGATIVE mg/dL
SPECIFIC GRAVITY, URINE: 1.019 (ref 1.005–1.030)
pH: 5 (ref 5.0–8.0)

## 2017-06-24 LAB — BASIC METABOLIC PANEL
ANION GAP: 8 (ref 5–15)
BUN: 15 mg/dL (ref 6–20)
CHLORIDE: 106 mmol/L (ref 101–111)
CO2: 21 mmol/L — ABNORMAL LOW (ref 22–32)
Calcium: 8.6 mg/dL — ABNORMAL LOW (ref 8.9–10.3)
Creatinine, Ser: 1.18 mg/dL — ABNORMAL HIGH (ref 0.44–1.00)
GFR calc Af Amer: 54 mL/min — ABNORMAL LOW (ref >60)
GFR calc non Af Amer: 47 mL/min — ABNORMAL LOW (ref >60)
Glucose, Bld: 268 mg/dL — ABNORMAL HIGH (ref 65–99)
POTASSIUM: 3.6 mmol/L (ref 3.5–5.1)
SODIUM: 135 mmol/L (ref 135–145)

## 2017-06-24 LAB — HEPATIC FUNCTION PANEL
ALK PHOS: 69 U/L (ref 38–126)
ALT: 14 U/L (ref 14–54)
AST: 21 U/L (ref 15–41)
Albumin: 3.4 g/dL — ABNORMAL LOW (ref 3.5–5.0)
Total Bilirubin: 0.2 mg/dL — ABNORMAL LOW (ref 0.3–1.2)
Total Protein: 6.7 g/dL (ref 6.5–8.1)

## 2017-06-24 LAB — ECHOCARDIOGRAM COMPLETE
Height: 60 in
Weight: 3081.6 oz

## 2017-06-24 LAB — TROPONIN I: Troponin I: <0.03 ng/mL (ref <0.03)

## 2017-06-24 LAB — BRAIN NATRIURETIC PEPTIDE: B Natriuretic Peptide: 16 pg/mL (ref 0.0–100.0)

## 2017-06-24 LAB — GLUCOSE, CAPILLARY
GLUCOSE-CAPILLARY: 161 mg/dL — AB (ref 65–99)
GLUCOSE-CAPILLARY: 188 mg/dL — AB (ref 65–99)
GLUCOSE-CAPILLARY: 119 mg/dL — AB (ref 65–99)
Glucose-Capillary: 125 mg/dL — ABNORMAL HIGH (ref 65–99)

## 2017-06-24 MED ORDER — LISINOPRIL 20 MG PO TABS
20.0000 mg | ORAL_TABLET | Freq: Every day | ORAL | Status: DC
  Administered 2017-06-24 – 2017-06-25 (×2): 20 mg via ORAL
  Filled 2017-06-24 (×2): qty 1

## 2017-06-24 MED ORDER — ASPIRIN EC 81 MG PO TBEC
81.0000 mg | DELAYED_RELEASE_TABLET | Freq: Every day | ORAL | Status: DC
  Administered 2017-06-24 – 2017-06-25 (×2): 81 mg via ORAL
  Filled 2017-06-24 (×2): qty 1

## 2017-06-24 MED ORDER — ASPIRIN 81 MG PO CHEW
324.0000 mg | CHEWABLE_TABLET | Freq: Once | ORAL | Status: AC
  Administered 2017-06-24: 324 mg via ORAL
  Filled 2017-06-24: qty 4

## 2017-06-24 MED ORDER — MOMETASONE FURO-FORMOTEROL FUM 200-5 MCG/ACT IN AERO
2.0000 | INHALATION_SPRAY | Freq: Two times a day (BID) | RESPIRATORY_TRACT | Status: DC
  Administered 2017-06-24 – 2017-06-25 (×3): 2 via RESPIRATORY_TRACT
  Filled 2017-06-24 (×2): qty 8.8

## 2017-06-24 MED ORDER — ONDANSETRON HCL 4 MG PO TABS: 4.0000 mg | ORAL_TABLET | Freq: Four times a day (QID) | ORAL | Status: DC | PRN

## 2017-06-24 MED ORDER — ACETAMINOPHEN 650 MG RE SUPP: 650.0000 mg | Freq: Four times a day (QID) | RECTAL | Status: DC | PRN

## 2017-06-24 MED ORDER — LACTATED RINGERS IV SOLN
INTRAVENOUS | Status: AC
  Administered 2017-06-24: 03:00:00 via INTRAVENOUS

## 2017-06-24 MED ORDER — ENOXAPARIN SODIUM 40 MG/0.4ML ~~LOC~~ SOLN
40.0000 mg | SUBCUTANEOUS | Status: DC
  Administered 2017-06-24 – 2017-06-25 (×2): 40 mg via SUBCUTANEOUS
  Filled 2017-06-24 (×2): qty 0.4

## 2017-06-24 MED ORDER — ACETAMINOPHEN 325 MG PO TABS
650.0000 mg | ORAL_TABLET | Freq: Four times a day (QID) | ORAL | Status: DC | PRN
  Administered 2017-06-24: 650 mg via ORAL
  Filled 2017-06-24: qty 2

## 2017-06-24 MED ORDER — IPRATROPIUM-ALBUTEROL 0.5-2.5 (3) MG/3ML IN SOLN
3.0000 mL | Freq: Four times a day (QID) | RESPIRATORY_TRACT | Status: DC | PRN
Start: 2017-06-24 — End: 2017-06-25
  Administered 2017-06-25: 3 mL via RESPIRATORY_TRACT
  Filled 2017-06-24: qty 3

## 2017-06-24 MED ORDER — ATORVASTATIN CALCIUM 20 MG PO TABS
40.0000 mg | ORAL_TABLET | Freq: Every day | ORAL | Status: DC
  Administered 2017-06-24 (×2): 40 mg via ORAL
  Filled 2017-06-24 (×2): qty 2

## 2017-06-24 MED ORDER — ONDANSETRON HCL 4 MG/2ML IJ SOLN: 4.0000 mg | Freq: Four times a day (QID) | INTRAMUSCULAR | Status: DC | PRN

## 2017-06-24 MED ORDER — SENNOSIDES-DOCUSATE SODIUM 8.6-50 MG PO TABS: 1.0000 | ORAL_TABLET | Freq: Every evening | ORAL | Status: DC | PRN

## 2017-06-24 MED ORDER — BISACODYL 5 MG PO TBEC: 5.0000 mg | DELAYED_RELEASE_TABLET | Freq: Every day | ORAL | Status: DC | PRN

## 2017-06-24 MED ORDER — SODIUM CHLORIDE 0.9% FLUSH
3.0000 mL | Freq: Two times a day (BID) | INTRAVENOUS | Status: DC
  Administered 2017-06-24 – 2017-06-25 (×4): 3 mL via INTRAVENOUS

## 2017-06-24 MED ORDER — PERFLUTREN LIPID MICROSPHERE
1.0000 mL | INTRAVENOUS | Status: AC | PRN
  Administered 2017-06-24: 2 mL via INTRAVENOUS
  Filled 2017-06-24: qty 10

## 2017-06-24 MED ORDER — PANTOPRAZOLE SODIUM 40 MG PO TBEC
40.0000 mg | DELAYED_RELEASE_TABLET | Freq: Every day | ORAL | Status: DC
  Administered 2017-06-24 – 2017-06-25 (×2): 40 mg via ORAL
  Filled 2017-06-24: qty 1

## 2017-06-24 MED ORDER — HYDROCHLOROTHIAZIDE 25 MG PO TABS
25.0000 mg | ORAL_TABLET | Freq: Every day | ORAL | Status: DC
  Filled 2017-06-24: qty 1

## 2017-06-24 MED ORDER — DONEPEZIL HCL 5 MG PO TABS
5.0000 mg | ORAL_TABLET | Freq: Every day | ORAL | Status: DC
  Administered 2017-06-24: 5 mg via ORAL
  Filled 2017-06-24 (×2): qty 1

## 2017-06-24 NOTE — Progress Notes (Signed)
*  PRELIMINARY RESULTS* Echocardiogram 2D Echocardiogram has been performed.  Cristela Blue 06/24/2017, 3:42 PM

## 2017-06-24 NOTE — Consult Note (Signed)
Cardiology Consult    Patient ID: MATISSE ROSKELLEY MRN: [id number], DOB/AGE: 66-Dec-1952   Admit date: 06/23/2017 Date of Consult: 06/24/2017  Primary Physician: McLean-Scocuzza, Pasty Spillers, MD Primary Cardiologist: Lewayne Bunting, MD Requesting Provider: Ozella Rocks, MD  Patient Profile    Lauren Koch is a 67 y.o. female with a history of cryptogenic stroke s/p MDT Linq, diast dysfxn, ongoing tob abuse, COPD on home O2, dementia, GERD, and HTN, who is being seen today for the evaluation of syncope at the request of Dr. Elisabeth Pigeon.  Past Medical History   Past Medical History:  Diagnosis Date  . Asthma   . Cataract   . COPD (chronic obstructive pulmonary disease) (HCC)   . Cryptogenic Stroke (HCC)    a. 03/2016 L ACA, L MCX, L PCA, and R dorsal pontine territory infarcts; b. 03/2016 Carotid U/S: 1-39% bil dzs; c. 03/2016 TEE: EF 60-65%, no rwma, no LA/LAA/RA/RAA thrombus. No PFO; d. 03/2016 s/p MDT Reveal Linq (ser# [id number]) - no AFib noted to date (some sinus w/ freq PACs).  . Dementia   . Diastolic dysfunction    a. 03/2016 Echo: EF 60-65%, gr1 DD, nl RV fxn.  Marland Kitchen GERD (gastroesophageal reflux disease)   . Hypertension   . Smoker     Past Surgical History:  Procedure Laterality Date  . ABDOMINAL HYSTERECTOMY     partial   . CESAREAN SECTION  1971/1976  . HERNIA REPAIR  2013  . LOOP RECORDER INSERTION N/A 03/23/2016   Procedure: Loop Recorder Insertion;  [name], MD;  [name];  Service: Cardiovascular;  [name];  . PARTIAL HYMENECTOMY    . TEE WITHOUT CARDIOVERSION N/A 03/23/2016   Procedure: TRANSESOPHAGEAL ECHOCARDIOGRAM (TEE);  [name], MD;  Location: [facility] ENDOSCOPY;  Service: Cardiovascular;  [name];     Allergies  Allergies  Allergen Reactions  . Prednisone Rash and Other (See Comments)    Arrhythmia, also (Has tolerated hydrocortisone and decadron)     History of Present Illness    67 y/o ? with  the above complex PMH including HTN, ongoing tob abuse, COPD on home O2, GERD, dementia, and cryptogenic stroke.  She was admitted to [facility] in 03/2016 with stroke, presumed to be embolic.  TEE was neg for cardioembolic source.  She underwent MDT Reval Linq placement at that time and has been followed in our device clinic.  Review of interrogations shows intermittent reports of Afib, but on MD review, these were found to be false, and instead were sinus with PAC's.  She has never had any significant tachy or bradyarrhythmias.  She lives in [place] with her dtr and granddtr.  She is sedentary and sits much of the day.  She is only able to walk short distances and otw uses a scooter to get around outside of the home.  She cont to smoke 1ppd and is only currently using O2 on a prn basis, though she was recently told by her PCP that she needs to wear it around the clock.  Though she has chronic DOE, she has no prior h/o chest pain, pnd, orthopnea, or edema.  She was in her usoh on 5/23, when she went with her family to a local pool hall.  She was sitting outside talking with a friend while her dtr and granddtr were inside playing darts.  Friend witnessed pt suddenly slumping down in her seat and becoming unresponsive. Pt has no recollection of this or  any prodromal symptoms.  Someone ran inside to alert her family and her granddtr came out and noted that she was slumped and initially unresponsive.  She was diaphoretic.  She sat next to her and tried to arouse her.  After about 10 mins, Lauren Koch became somewhat responsive but was very groggy and only grunting.  EMS arrived ~ 20 mins after the syncopal event, and Lauren Koch was noted to be hypotensive with pressures in the 80's.  No report of arrhythmia.  She was transported to [facility] where she was still hypotensive.  Creat mildly elevated above baseline @ 1.17.  ECG non-acute. H/H, lytes, UA, CXR, Head CT w/o acute findings. Pt admitted for further eval.  BP better  with hydration.  No events on tele.  No recurrent presyncope/syncope.  Inpatient Medications    . aspirin EC  81 mg Oral Daily  . atorvastatin  40 mg Oral q1800  . donepezil  5 mg Oral QHS  . enoxaparin (LOVENOX) injection  40 mg Subcutaneous Q24H  . lisinopril  20 mg Oral Daily  . mometasone-formoterol  2 puff Inhalation BID  . sodium chloride flush  3 mL Intravenous Q12H    Family History    Family History  Problem Relation Age of Onset  . Diabetes Mother   . Hypertension Mother   . Miscarriages / [place] Mother   . Hyperlipidemia Mother   . Heart disease Father   . Diabetes Father   . Hypertension Father   . Hyperlipidemia Father   . Stroke Father   . Cancer Sister        skin  . Diabetes Other   . Hyperlipidemia Daughter    indicated that her mother is alive. She indicated that her father is deceased. She indicated that all of her four sisters are alive. She indicated that her brother is alive. She indicated that her maternal grandmother is deceased. She indicated that her maternal grandfather is deceased. She indicated that her paternal grandmother is deceased. She indicated that her paternal grandfather is deceased. She indicated that her daughter is alive. She indicated that the status of her other is unknown.   Social History    Social History   Socioeconomic History  . Marital status: Widowed    Spouse name: Not on file  . Number of children: Not on file  . Years of education: Not on file  . Highest education level: Not on file  Occupational History  . Not on file  Social Needs  . Financial resource strain: Not on file  . Food insecurity:    Worry: Not on file    Inability: Not on file  . Transportation needs:    Medical: Not on file    Non-medical: Not on file  Tobacco Use  . Smoking status: Current Some Day Smoker    Packs/day: 1.00    Years: 30.00    Pack years: 30.00    Types: Cigarettes  . Smokeless tobacco: Former Neurosurgeon    Quit date:  05/29/2015  Substance and Sexual Activity  . Alcohol use: Yes    Alcohol/week: 0.0 oz    Comment: occasionally  . Drug use: No  . Sexual activity: Not Currently    Birth control/protection: None  Lifestyle  . Physical activity:    Days per week: Not on file    Minutes per session: Not on file  . Stress: Not on file  Relationships  . Social connections:    Talks on phone: Not  on file    Gets together: Not on file    Attends religious service: Not on file    Active member of club or organization: Not on file    Attends meetings of clubs or organizations: Not on file    Relationship status: Not on file  . Intimate partner violence:    Fear of current or ex partner: Not on file    Emotionally abused: Not on file    Physically abused: Not on file    Forced sexual activity: Not on file  Other Topics Concern  . 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  No chills, fever, night sweats or weight changes.  Cardiovascular:  No chest pain, +++ chronic dyspnea on exertion, no edema, orthopnea, palpitations, paroxysmal nocturnal dyspnea. +++ syncope without prodrome. Dermatological: No rash, lesions/masses Respiratory: No cough, +++ chronic dyspnea Urologic: No hematuria, dysuria Abdominal:   No nausea, vomiting, diarrhea, bright red blood per rectum, melena, or hematemesis Neurologic:  No visual changes, wkns, changes in mental status. All other systems reviewed and are otherwise negative except as noted above.  Physical Exam    Blood pressure 125/77, pulse 67, temperature 97.7 F (36.5 C), temperature source Oral, resp. rate 16, height 5' (1.524 m), weight 192 lb 9.6 oz (87.4 kg), SpO2 97 %.  General: Pleasant, NAD Psych: Nl affect. Neuro: Alert  and oriented X 3 though she defers most of her answers to my questions to her dtr and granddtr. Moves all extremities spontaneously. HEENT: Normal  Neck: Supple without bruits or JVD. Lungs:  Resp regular and unlabored, diminished breath sounds bilat with scatt rhonchi. Heart: RRR no s3, s4, 2/6 syst murmur @ base. Abdomen: Soft, non-tender, non-distended, BS + x 4.  Extremities: No clubbing, cyanosis or edema. DP/PT/Radials 2+ and equal bilaterally.  Labs     Recent Labs    06/23/17 2241  TROPONINI <0.03   Lab Results  Component Value Date   WBC 8.0 06/23/2017   HGB 14.6 06/23/2017   HCT 43.1 06/23/2017   MCV 86.3 06/23/2017   PLT 313 06/23/2017    Recent Labs  Lab 06/23/17 2241  NA 140  K 3.7  CL 104  CO2 27  BUN 14  CREATININE 1.17*  CALCIUM 9.1  PROT 6.7  BILITOT 0.2*  ALKPHOS 69  ALT 14  AST 21  GLUCOSE 127*   Lab Results  Component Value Date   CHOL 211 (H) 03/20/2016   HDL 77 03/20/2016   LDLCALC 122 (H) 03/20/2016   TRIG 60 03/20/2016     Radiology Studies    Dg Chest 2 View  Result Date: 06/14/2017 CLINICAL DATA:  68 y/o  F; mid chest pain and shortness of breath. EXAM: CHEST - 2 VIEW COMPARISON:  04/12/2017 chest radiograph and chest CT. FINDINGS: Stable normal heart size given projection and technique. Right arch. Loop recorder noted projecting over anterior chest wall. No consolidation, effusion, or pneumothorax. Bones are unremarkable. IMPRESSION: No acute pulmonary process identified.  Right arch. Electronically Signed   By: Mitzi Hansen M.D.   On: 06/14/2017 00:36   Ct Head Wo Contrast  Result  Date: 06/23/2017 CLINICAL DATA:  Seizure, new, nontraumatic, >40 yrs. EXAM: CT HEAD WITHOUT CONTRAST TECHNIQUE: Contiguous axial images were obtained from the base of the skull through the vertex without intravenous contrast. COMPARISON:  Head CT and brain MRI February 2018 FINDINGS: Brain: Stable degree of atrophy and chronic small vessel  ischemia. Remote lacunar infarct in right caudate. No intracranial hemorrhage, mass effect, or midline shift. No hydrocephalus. The basilar cisterns are patent. No evidence of territorial infarct or acute ischemia. No extra-axial or intracranial fluid collection. Vascular: Mild atherosclerosis of skull base vasculature without hyperdense vessel. Skull: No fracture or focal lesion. Sinuses/Orbits: Paranasal sinuses and mastoid air cells are clear. The visualized orbits are unremarkable. Other: None. IMPRESSION: 1.  No acute intracranial abnormality. 2. Stable atrophy and chronic small vessel ischemia. Electronically Signed   By: Rubye Oaks M.D.   On: 06/23/2017 23:56   Dg Chest Port 1 View  Result Date: 06/23/2017 CLINICAL DATA:  Short of breath, syncopal episode EXAM: PORTABLE CHEST 1 VIEW COMPARISON:  06/14/2017, 04/12/2017 FINDINGS: Electronic device over the left lower chest. Borderline cardiomegaly. No acute opacity or pleural effusion. Right-sided aortic arch. No pneumothorax. IMPRESSION: No active disease.  Right-sided aortic arch Electronically Signed   By: Jasmine Pang M.D.   On: 06/23/2017 23:43    ECG & Cardiac Imaging    RSR, 63, non-specific T changes  Assessment & Plan    1.  Syncope:  Pt with prior h/o cryptogenic stroke was sitting and talking with a friend while outside a pool hall on 5/23, when she was noted to suddenly stop talking and slump down in her seat.  Family reports that she was unresponsive for about 10 mins and was markedly diaphoretic.  After regaining consciousness, she was groggy and grunting some.  Pt has no recollection of any of this.  Upon EMS arrival, ~ 20 mins after event, she was hypotensive w/ pressures in the 80's  ED  BP 85/76 on arrival.  ECG non-acute.  Creat mildly elevated above baseline @ 1.17.  Trop nl.  BP improved with hydration. No recurrent presyncope/syncope. No events on tele.  I've talked with the MDT rep, who will come and interrogate her  Linq today.  Provided no significant arrhythmias, this most likely represents hypotension in the setting of mild dehydration vs vasovagal episode.  We have placed HCTZ on hold.  Further recs pending device interrogation.  If she is felt to need an MRI, her device is compatible so long as the MRI meets certain criteria : [email or web address].  2.  H/o CVA:  No Afib noted on Linq interrogations up to this point.  Head CT w/o acute findings yesterday.    3.  Hypotension w/ h/o essential HTN:  Hypotensive on arrival with evidence of mild dehydration in setting of acei/hctz rx @ home.  Holding hctz.  BP better and feeling better w/ hydration.  4.  Tob Abuse/COPD:  Recently advised that she will require 24 hr supplemental O2 in setting of hypoxia w/ exertion on room air (dropped to 85% @ 5/10 office visit), though she is only using prn.  She still smokes 1ppd.  Tob cessation advised.  5. Prerenal azotemia:  Mild.  Holding HCTZ.  Cont IVF.  F/u bmet.  Signed, Nicolasa Ducking, NP 06/24/2017, 9:20 AM  For questions or updates, please contact   Please consult [email or web address] for contact info under Cardiology/STEMI.

## 2017-06-24 NOTE — Progress Notes (Signed)
Patient complaining of heart burn and requested for medication for that. Dr. Caryn Bee notified with a new order for Protonix 40 oral every day. Will administer and continue to monitor.

## 2017-06-24 NOTE — H&P (Addendum)
[employer] [facility] [place] at [facility]   PATIENT NAME: Lauren Koch    MR#:  [id number]  DATE OF BIRTH:  Sep 24, 1950  DATE OF ADMISSION:  06/23/2017  PRIMARY CARE PHYSICIAN: McLean-Scocuzza, Pasty Spillers, MD   REQUESTING/REFERRING PHYSICIAN: Merrily Brittle, MD  CHIEF COMPLAINT:   Chief Complaint  Patient presents with  . Loss of Consciousness   HISTORY OF PRESENT ILLNESS:  Lauren Koch  is a 67 y.o. female with a known history of HTN, CVA, mild dementia p/w syncope. Pt AAOx3, is at baseline. Answers questions appropriately. Hx assisted by family, as pt is p/w LOC. Pt was out w/ family, was sitting down outside and suddenly slumped over to one side. Tongue was out, pt was sweating, unconsciousness estimated to last 5-14min per family. EMS called. Pt apparently transiently tremulous while regaining consciousness, c/o feeling tired/sleepy after waking. Pt remembers talking to family outside, then waking to EMS. No confusion. Had similar incident ~50mo ago. Mild dementia recently Dx, recently started on Aricept. Good PO intake of food/fluids. Otherwise feels well, w/o complaint. VSS. Physical exam normal. Neurologically nonfocal. Labs demonstrate hyperglycemia (Glu 127), otherwise unimpressive. Cr 1.17, near baseline (0.9-1.1). Trop-I (-). U/A (-) infxn. EKG sinus. CXR (-). CT head (-) acute intracranial abnl. Pt has Medtronic Reveal LINQ device (implanted after prior CVA). Obs, syncope.  PAST MEDICAL HISTORY:   Past Medical History:  Diagnosis Date  . Asthma   . Cataract   . COPD (chronic obstructive pulmonary disease) (HCC)   . GERD (gastroesophageal reflux disease)   . Hypertension   . Smoker   . Stroke [facility])    PAST SURGICAL HISTORY:   Past Surgical History:  Procedure Laterality Date  . ABDOMINAL HYSTERECTOMY     partial   . CESAREAN SECTION  1971/1976  . HERNIA REPAIR  2013  . LOOP RECORDER INSERTION N/A 03/23/2016   Procedure: Loop Recorder Insertion;  Surgeon:  Marinus Maw, MD;  [name];  Service: Cardiovascular;  [name];  . PARTIAL HYMENECTOMY    . TEE WITHOUT CARDIOVERSION N/A 03/23/2016   Procedure: TRANSESOPHAGEAL ECHOCARDIOGRAM (TEE);  [name], MD;  Location: [facility] ENDOSCOPY;  Service: Cardiovascular;  [name];   SOCIAL HISTORY:   Social History   Tobacco Use  . Smoking status: Current Some Day Smoker    Packs/day: 0.50    Years: 30.00    Pack years: 15.00    Types: Cigarettes  . Smokeless tobacco: Former Neurosurgeon    Quit date: 05/29/2015  Substance Use Topics  . Alcohol use: Yes    Alcohol/week: 0.0 oz    Comment: occasionally    FAMILY HISTORY:   Family History  Problem Relation Age of Onset  . Diabetes Mother   . Hypertension Mother   . Miscarriages / [place] Mother   . Hyperlipidemia Mother   . Heart disease Father   . Diabetes Father   . Hypertension Father   . Hyperlipidemia Father   . Stroke Father   . Cancer Sister        skin  . Diabetes Other   . Hyperlipidemia Daughter     DRUG ALLERGIES:   Allergies  Allergen Reactions  . Prednisone Rash and Other (See Comments)    Arrhythmia, also (Has tolerated hydrocortisone and decadron)     REVIEW OF SYSTEMS:   Review of Systems  Constitutional: Positive for diaphoresis and malaise/fatigue. Negative for chills, fever and weight loss.  HENT: Negative for congestion, ear  pain, hearing loss, nosebleeds, sinus pain, sore throat and tinnitus.   Eyes: Negative for blurred vision, double vision and photophobia.  Respiratory: Negative for cough, hemoptysis, sputum production, shortness of breath and wheezing.   Cardiovascular: Negative for chest pain, palpitations, orthopnea, claudication, leg swelling and PND.  Gastrointestinal: Negative for abdominal pain, blood in stool, constipation, diarrhea, heartburn, melena, nausea and vomiting.  Genitourinary: Negative for dysuria, frequency, hematuria and urgency.    Musculoskeletal: Negative for back pain, joint pain, myalgias and neck pain.  Skin: Negative for itching and rash.  Neurological: Positive for tremors and loss of consciousness. Negative for dizziness, tingling, sensory change, speech change, focal weakness, seizures, weakness and headaches.  Psychiatric/Behavioral: Negative for memory loss. The patient does not have insomnia.     MEDICATIONS AT HOME:   Prior to Admission medications   Medication Sig Start Date End Date Taking? Authorizing Provider  albuterol (PROVENTIL HFA;[name] [name] 03/03/15  Yes Dorena Bodo, PA-C  aspirin EC 81 MG EC tablet [name] 04/15/17  Yes Adrian Saran, MD  atorvastatin (LIPITOR) 40 MG tablet [name] 06/10/17  Yes McLean-Scocuzza, Pasty Spillers, MD  donepezil (ARICEPT) 5 MG tablet [name] 06/10/17  Yes McLean-Scocuzza, Pasty Spillers, MD  Fluticasone-Salmeterol (ADVAIR DISKUS) 250-50 MCG/DOSE AEPB Inhale 1 puff into the lungs 2 (two) times daily. Rinse mouth 06/10/17  Yes McLean-Scocuzza, Pasty Spillers, MD  hydrochlorothiazide (HYDRODIURIL) 25 MG tablet [name] 06/10/17  Yes McLean-Scocuzza, Pasty Spillers, MD  ipratropium-albuterol (DUONEB) 0.5-2.5 (3) MG/3ML SOLN [name] 06/10/17  Yes McLean-Scocuzza, Pasty Spillers, MD  levofloxacin (LEVAQUIN) 750 MG tablet [name] 06/10/17  Yes McLean-Scocuzza, Pasty Spillers, MD  lisinopril (PRINIVIL,[name] [name] 06/10/17  Yes McLean-Scocuzza, Pasty Spillers, MD      VITAL SIGNS:  Blood pressure 119/66, pulse 66, temperature 97.8 F (36.6 C), resp. rate 16, height  (1.549 m), weight 86.2 kg (190 lb), SpO2 100  %.  PHYSICAL EXAMINATION:  Physical Exam  Constitutional: She is oriented to person, place, and time. She appears well-developed and well-nourished. She is active and cooperative.  Non-toxic appearance. She does not have a sickly appearance. She does not appear ill. No distress. She is not intubated.  HENT:  Head: Normocephalic and atraumatic.  Mouth/Throat: Oropharynx is clear and moist. No oropharyngeal exudate.  Eyes: Conjunctivae, EOM and lids are normal. No scleral icterus.  Neck: Neck supple. No JVD present. No thyromegaly present.  Cardiovascular: Normal rate, regular rhythm, S1 normal, S2 normal and normal heart sounds.  No extrasystoles are present. Exam reveals no gallop, no S3, no S4, no distant heart sounds and no friction rub.  No murmur heard. Pulmonary/Chest: Effort normal and breath sounds normal. No accessory muscle usage or stridor. No apnea, no tachypnea and no bradypnea. She is not intubated. No respiratory distress. She has no decreased breath sounds. She has no wheezes. She has no rhonchi. She has no rales.  Abdominal: Soft. Bowel sounds are normal. She exhibits no distension. There is no tenderness. There is no rebound and no guarding.  Musculoskeletal: Normal range of motion. She exhibits no edema.  Lymphadenopathy:    She has no cervical adenopathy.  Neurological: She is alert and oriented to person, place, and time. She has normal strength and normal reflexes. She is not disoriented. She displays no tremor and normal reflexes. No cranial nerve deficit or sensory deficit. She exhibits normal muscle tone. She displays no seizure activity.  Skin: Skin is warm, dry and intact. No rash noted. She is not diaphoretic. No erythema.  Psychiatric: She has a normal mood and affect. Her speech is normal and behavior is normal. Judgment and thought content normal.   LABORATORY PANEL:   CBC Recent Labs  Lab 06/23/17 2241  WBC 8.0  HGB 14.6  HCT 43.1  PLT 313    ------------------------------------------------------------------------------------------------------------------  Chemistries  Recent Labs  Lab 06/23/17 2241  NA 140  K 3.7  CL 104  CO2 27  GLUCOSE 127*  BUN 14  CREATININE 1.17*  CALCIUM 9.1  AST 21  ALT 14  ALKPHOS 69  BILITOT 0.2*   ------------------------------------------------------------------------------------------------------------------  Cardiac Enzymes Recent Labs  Lab 06/23/17 2241  TROPONINI <0.03   ------------------------------------------------------------------------------------------------------------------  RADIOLOGY:  Ct Head Wo Contrast  Result Date: 06/23/2017 CLINICAL DATA:  Seizure, new, nontraumatic, >40 yrs. EXAM: CT HEAD WITHOUT CONTRAST TECHNIQUE: Contiguous axial images were obtained from the base of the skull through the vertex without intravenous contrast. COMPARISON:  Head CT and brain MRI February 2018 FINDINGS: Brain: Stable degree of atrophy and chronic small vessel ischemia. Remote lacunar infarct in right caudate. No intracranial hemorrhage, mass effect, or midline shift. No hydrocephalus. The basilar cisterns are patent. No evidence of territorial infarct or acute ischemia. No extra-axial or intracranial fluid collection. Vascular: Mild atherosclerosis of skull base vasculature without hyperdense vessel. Skull: No fracture or focal lesion. Sinuses/Orbits: Paranasal sinuses and mastoid air cells are clear. The visualized orbits are unremarkable. Other: None. IMPRESSION: 1.  No acute intracranial abnormality. 2. Stable atrophy and chronic small vessel ischemia. Electronically Signed   By: Rubye Oaks M.D.   On: 06/23/2017 23:56   Dg Chest Port 1 View  Result Date: 06/23/2017 CLINICAL DATA:  Short of breath, syncopal episode EXAM: PORTABLE CHEST 1 VIEW COMPARISON:  06/14/2017, 04/12/2017 FINDINGS: Electronic device over the left lower chest. Borderline cardiomegaly. No acute opacity  or pleural effusion. Right-sided aortic arch. No pneumothorax. IMPRESSION: No active disease.  Right-sided aortic arch Electronically Signed   By: Jasmine Pang M.D.   On: 06/23/2017 23:43   IMPRESSION AND PLAN:   A/P: 29F syncope of unclear etiology, hyperglycemia, transient hypotension (now resolved), elevated Cr. -Etiology of syncope unclear. Narrative inconsistent w/ seizure. Narrative also not compelling for vasovagal/neurocardiogenic syncope. Neurologically nonfocal. Heart sounds normal. Does not appear dehydrated, looks well. Reportedly hypotensive by EMS, BP WNL in ED. Labwork, imaging unimpressive thus far. 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 Avoid nephrotoxins. -c/w home meds/formulary subs -Cardiac diet -IVF -Lovenox -Full code -Observation, < 2 midnights   All the records are reviewed and case discussed with ED provider. Management plans discussed with the patient, family and they are in agreement.  CODE STATUS: Full code  TOTAL TIME TAKING CARE OF THIS PATIENT: 60 minutes.    Barbaraann Rondo M.D on 06/24/2017 at 1:59 AM  Between 7am to 6pm - Pager - [phone]  After 6pm go to [email or web address] - Social research officer, government  [facility] [place] [facility]  Office  [phone]  CC: Primary care physician; McLean-Scocuzza, Pasty Spillers, MD   Note: This dictation was prepared with Dragon dictation along with smaller phrase technology. Any transcriptional errors that result from this process are unintentional.

## 2017-06-24 NOTE — Plan of Care (Signed)
  Problem: Education: Goal: Knowledge of General Education information will improve Outcome: Progressing   Problem: Clinical Measurements: Goal: Will remain free from infection Outcome: Progressing   Problem: Safety: Goal: Ability to remain free from injury will improve Outcome: Progressing   

## 2017-06-24 NOTE — Progress Notes (Signed)
Inpatient Diabetes Program Recommendations  AACE/ADA: New Consensus Statement on Inpatient Glycemic Control (2015)  Target Ranges:  Prepandial:   less than 140 mg/dL      Peak postprandial:   less than 180 mg/dL (1-2 hours)      Critically ill patients:  140 - 180 mg/dL   Lab Results  Component Value Date   GLUCAP 188 (H) 06/24/2017   HGBA1C 6.2 (H) 03/20/2016    Review of Glycemic ControlResults for PRIM, MORACE (MRN [id number]) as of 06/24/2017 12:13  Ref. Range 06/24/2017 11:53  Glucose-Capillary Latest Ref Range: 65 - 99 mg/dL 086 (H)  Results for KACI, DILLIE (MRN [id number]) as of 06/24/2017 12:13  Ref. Range 06/24/2017 10:21  Glucose Latest Ref Range: 65 - 99 mg/dL 528 (H)   Diabetes history: None- hx. Of hyperglycemia noted Outpatient Diabetes medications: None Current orders for Inpatient glycemic control:  None Inpatient Diabetes Program Recommendations:   While in the hospital, consider adding Novolog sensitive correction tid with meals and HS. Of note patient did receive Solumedrol x1 on 5/23 which likely has contributed to increased blood sugars.   Thanks,  Beryl Meager, RN, BC-ADM Inpatient Diabetes Coordinator Pager [phone] (8a-5p)

## 2017-06-24 NOTE — Care Management Obs Status (Signed)
MEDICARE OBSERVATION STATUS NOTIFICATION   Patient Details  Name: Lauren Koch MRN: [id number] Date of Birth: [birth year]   Medicare Observation Status Notification Given:  Yes    Marily Memos, RN 06/24/2017, 12:23 PM

## 2017-06-24 NOTE — Progress Notes (Addendum)
Pt complained of mid chest pain. Doctor Elisabeth Pigeon  Was notified and he ordered STAT EKG and troponin now then every 4 hours. Will continue to monitor.  Update 1900: Doctor Luberta Mutter was page if pt can have pain med's other than tylenol and pt also requested TUMS. Awaiting callback. Will continue to monitor.

## 2017-06-25 DIAGNOSIS — I10 Essential (primary) hypertension: Secondary | ICD-10-CM | POA: Diagnosis not present

## 2017-06-25 DIAGNOSIS — I959 Hypotension, unspecified: Secondary | ICD-10-CM | POA: Diagnosis not present

## 2017-06-25 DIAGNOSIS — R55 Syncope and collapse: Secondary | ICD-10-CM | POA: Diagnosis not present

## 2017-06-25 DIAGNOSIS — R739 Hyperglycemia, unspecified: Secondary | ICD-10-CM | POA: Diagnosis not present

## 2017-06-25 DIAGNOSIS — R7989 Other specified abnormal findings of blood chemistry: Secondary | ICD-10-CM | POA: Diagnosis not present

## 2017-06-25 LAB — BASIC METABOLIC PANEL
Anion gap: 6 (ref 5–15)
BUN: 18 mg/dL (ref 6–20)
CALCIUM: 8.4 mg/dL — AB (ref 8.9–10.3)
CO2: 27 mmol/L (ref 22–32)
CREATININE: 0.86 mg/dL (ref 0.44–1.00)
Chloride: 108 mmol/L (ref 101–111)
GFR calc Af Amer: >60 mL/min (ref >60)
GFR calc non Af Amer: >60 mL/min (ref >60)
GLUCOSE: 103 mg/dL — AB (ref 65–99)
Potassium: 3.7 mmol/L (ref 3.5–5.1)
Sodium: 141 mmol/L (ref 135–145)

## 2017-06-25 LAB — GLUCOSE, CAPILLARY
GLUCOSE-CAPILLARY: 86 mg/dL (ref 65–99)
Glucose-Capillary: 81 mg/dL (ref 65–99)

## 2017-06-25 LAB — TROPONIN I

## 2017-06-25 LAB — HIV ANTIBODY (ROUTINE TESTING W REFLEX): HIV Screen 4th Generation wRfx: NONREACTIVE

## 2017-06-25 NOTE — Progress Notes (Signed)
Progress Note  Patient Name: Lauren Koch Date of Encounter: 06/25/2017  Primary Cardiologist: Lewayne Bunting, MD   Subjective   Feeling well.  No chest pain or shortness of breath.  No lightheadedness or dizziness.  Inpatient Medications    Scheduled Meds: . aspirin EC  81 mg Oral Daily  . atorvastatin  40 mg Oral q1800  . donepezil  5 mg Oral QHS  . enoxaparin (LOVENOX) injection  40 mg Subcutaneous Q24H  . lisinopril  20 mg Oral Daily  . mometasone-formoterol  2 puff Inhalation BID  . pantoprazole  40 mg Oral Daily  . sodium chloride flush  3 mL Intravenous Q12H   Continuous Infusions:  PRN Meds: acetaminophen **OR** acetaminophen, bisacodyl, ipratropium-albuterol, ondansetron **OR** ondansetron (ZOFRAN) IV, senna-docusate   Vital Signs    Vitals:   06/24/17 1617 06/24/17 2049 06/25/17 0414 06/25/17 0746  BP: 115/61 111/60 121/76 122/73  Pulse: 69 66 65 (!) 47  Resp:    18  Temp: 98.3 F (36.8 C)  98.3 F (36.8 C) 98.4 F (36.9 C)  TempSrc: Oral  Oral Oral  SpO2: 96% 96% 96% 97%  Weight:   198 lb (89.8 kg)   Height:        Intake/Output Summary (Last 24 hours) at 06/25/2017 1104 Last data filed at 06/25/2017 1010 Gross per 24 hour  Intake 1080 ml  Output 650 ml  Net 430 ml   Filed Weights   06/23/17 2221 06/24/17 0228 06/25/17 0414  Weight: 190 lb (86.2 kg) 192 lb 9.6 oz (87.4 kg) 198 lb (89.8 kg)    Telemetry    Sinus rhythm, sinus bradycardia.  Rate upper 40s to 90s.  - Personally Reviewed  ECG    Sinus rhythm.  Rate 74 bpm.  Anterior TWI unchanged from prior. - Personally Reviewed  Physical Exam   VS:  BP 122/73 (BP Location: Left Arm)   Pulse (!) 47   Temp 98.4 F (36.9 C) (Oral)   Resp 18   Ht 5' (1.524 m)   Wt 198 lb (89.8 kg)   SpO2 97%   BMI 38.67 kg/m   , BMI Body mass index is 38.67 kg/m. GENERAL:  Well appearing HEENT: Pupils equal round and reactive, fundi not visualized, oral mucosa unremarkable NECK:  No jugular venous  distention, waveform within normal limits, carotid upstroke brisk and symmetric, no bruits LUNGS:  Clear to auscultation bilaterally HEART:  RRR.  PMI not displaced or sustained,S1 and S2 within normal limits, no S3, no S4, no clicks, no rubs, no murmurs ABD:  Flat, positive bowel sounds normal in frequency in pitch, no bruits, no rebound, no guarding, no midline pulsatile mass, no hepatomegaly, no splenomegaly EXT:  2 plus pulses throughout, no edema, no cyanosis no clubbing SKIN:  No rashes no nodules NEURO:  Cranial nerves II through XII grossly intact, motor grossly intact throughout [facility]:  Cognitively intact, oriented to person place and time   Labs    Chemistry Recent Labs  Lab 06/23/17 2241 06/24/17 1021 06/25/17 0900  NA 140 135 141  K 3.7 3.6 3.7  CL 104 106 108  CO2 27 21* 27  GLUCOSE 127* 268* 103*  BUN CREATININE 1.17* 1.18* 0.86  CALCIUM 9.1 8.6* 8.4*  PROT 6.7  --   --   ALBUMIN 3.4*  --   --   AST 21  --   --   ALT 14  --   --  ALKPHOS 69  --   --   BILITOT 0.2*  --   --   GFRNONAA 47* 47* >60  GFRAA 55* 54* >60  ANIONGAP Hematology Recent Labs  Lab 06/23/17 2241  WBC 8.0  RBC 4.99  HGB 14.6  HCT 43.1  MCV 86.3  MCH 29.3  MCHC 33.9  RDW 16.0*  PLT 313    Cardiac Enzymes Recent Labs  Lab 06/23/17 2241 06/24/17 1903 06/24/17 2308 06/25/17 0231  TROPONINI <0.03 <0.03 <0.03 <0.03   No results for input(s): TROPIPOC in the last 168 hours.   BNP Recent Labs  Lab 06/23/17 2241  BNP 16.0     DDimer No results for input(s): DDIMER in the last 168 hours.   Radiology    Ct Head Wo Contrast  Result Date: 06/23/2017 CLINICAL DATA:  Seizure, new, nontraumatic, >40 yrs. EXAM: CT HEAD WITHOUT CONTRAST TECHNIQUE: Contiguous axial images were obtained from the base of the skull through the vertex without intravenous contrast. COMPARISON:  Head CT and brain MRI February 2018 FINDINGS: Brain: Stable degree of atrophy and  chronic small vessel ischemia. Remote lacunar infarct in right caudate. No intracranial hemorrhage, mass effect, or midline shift. No hydrocephalus. The basilar cisterns are patent. No evidence of territorial infarct or acute ischemia. No extra-axial or intracranial fluid collection. Vascular: Mild atherosclerosis of skull base vasculature without hyperdense vessel. Skull: No fracture or focal lesion. Sinuses/Orbits: Paranasal sinuses and mastoid air cells are clear. The visualized orbits are unremarkable. Other: None. IMPRESSION: 1.  No acute intracranial abnormality. 2. Stable atrophy and chronic small vessel ischemia. Electronically Signed   By: Rubye Oaks M.D.   On: 06/23/2017 23:56   Dg Chest Port 1 View  Result Date: 06/23/2017 CLINICAL DATA:  Short of breath, syncopal episode EXAM: PORTABLE CHEST 1 VIEW COMPARISON:  06/14/2017, 04/12/2017 FINDINGS: Electronic device over the left lower chest. Borderline cardiomegaly. No acute opacity or pleural effusion. Right-sided aortic arch. No pneumothorax. IMPRESSION: No active disease.  Right-sided aortic arch Electronically Signed   By: Jasmine Pang M.D.   On: 06/23/2017 23:43    Cardiac Studies   Echo 06/24/17: Study Conclusions  - Procedure narrative: Transthoracic echocardiography. The study   was technically difficult. Intravenous contrast (Definity) was   administered. - Left ventricle: The cavity size was normal. There was mild   concentric hypertrophy. Systolic function was vigorous. The   estimated ejection fraction was in the range of 65% to 70%. Wall   motion was normal; there were no regional wall motion   abnormalities. Doppler parameters are consistent with abnormal   left ventricular relaxation (grade 1 diastolic dysfunction). - Left atrium: The atrium was mildly dilated. - Pulmonary arteries: Systolic pressure could not be accurately   estimated.  Patient Profile     67 y.o. female with cryptogenic stroke, chronic  diastolic heart failure, ongoing tobacco abuse, COPD, hypertension, dementia, and GERD here with syncope.  Assessment & Plan    # Syncope:  Patient became unresponsive and diaphoretic in a pool hall.  She was hypotensive and intravascularly volume depleted.  She is feeling well after IV fluids.  Echo unremarkable.  Her implantable loop recorder did not reveal any arrhythmias at the time of her episode.  Of note, she has been bradycardic to the upper 40s overnight.  However there is been no associated symptoms.  No indication for pacemaker at this time.  # Hypertension: Blood pressure well-controlled.Given that intravascular volume depletion  contributed to her episode her home HCTZ has been held and her blood pressure remains controlled.  # Cryptogenic stroke: 03/2016.  Implantable loop recorder in place.    # Tobacco abuse: Cessation advised.   For questions or updates, please contact [facility] Please consult [email or web address] for contact info under Cardiology/STEMI.      Signed, Chilton Si, MD  06/25/2017, 11:04 AM

## 2017-06-25 NOTE — Discharge Summary (Signed)
[facility] - [place] at [facility]   PATIENT NAME: Lauren Koch    MR#:  [id number]  DATE OF BIRTH:  1950-08-02  DATE OF ADMISSION:  06/23/2017 ADMITTING PHYSICIAN: Barbaraann Rondo, MD  DATE OF DISCHARGE: 06/25/2017   PRIMARY CARE PHYSICIAN: McLean-Scocuzza, Pasty Spillers, MD    ADMISSION DIAGNOSIS:  Syncope and collapse [R55] Seizure (HCC) [R56.9]  DISCHARGE DIAGNOSIS:  Active Problems:   Syncope   SECONDARY DIAGNOSIS:   Past Medical History:  Diagnosis Date  . Asthma   . Cataract   . COPD (chronic obstructive pulmonary disease) (HCC)   . Cryptogenic Stroke (HCC)    a. 03/2016 L ACA, L MCX, L PCA, and R dorsal pontine territory infarcts; b. 03/2016 Carotid U/S: 1-39% bil dzs; c. 03/2016 TEE: EF 60-65%, no rwma, no LA/LAA/RA/RAA thrombus. No PFO; d. 03/2016 s/p MDT Reveal Linq (ser# [id number]) - no AFib noted to date (some sinus w/ freq PACs).  . Dementia   . Diastolic dysfunction    a. 03/2016 Echo: EF 60-65%, gr1 DD, nl RV fxn.  Marland Kitchen GERD (gastroesophageal reflux disease)   . Hypertension   . Smoker     HOSPITAL COURSE:   * Syncope Concerning for hypertension, orthostasis- had mild Ac renal failure- which is corrected now with IV fluids. We had reveal device interrogated to rule out arrhythmia- appreciated cardiology help. Less likely stroke given no focal neurologic deficits -Currently on IV fluids for hypovolemia Would recommend , hold her HCTZ BP stable.- cont Lisinopril.  * Hx CVA Head CT w/o acute findings yesterday.  Reveal/Linq is interrogated  * COPD, active smoker Discussed with family, Smoking cessation recommended,  They are likely enabling her as she has mild dementia  * Chest pain- monitor troponin.    DISCHARGE CONDITIONS:   Stable.  CONSULTS OBTAINED:  Treatment Team:  Barbaraann Rondo, MD Antonieta Iba, MD  DRUG ALLERGIES:   Allergies  Allergen Reactions  . Prednisone Rash and Other (See Comments)     Arrhythmia, also (Has tolerated hydrocortisone and decadron)     DISCHARGE MEDICATIONS:   Allergies as of 06/25/2017      Reactions   Prednisone Rash, Other (See Comments)   Arrhythmia, also (Has tolerated hydrocortisone and decadron)      Medication List    STOP taking these medications   hydrochlorothiazide 25 MG tablet Commonly known as:  HYDRODIURIL   levofloxacin 750 MG tablet Commonly known as:  LEVAQUIN     TAKE these medications   albuterol 108 (90 Base) MCG/ACT inhaler Commonly known as:  PROVENTIL HFA;VENTOLIN HFA [name]   aspirin 81 MG EC tablet [name]   atorvastatin 40 MG tablet Commonly known as:  LIPITOR [name]   donepezil 5 MG tablet Commonly known as:  ARICEPT [name]   Fluticasone-Salmeterol 250-50 MCG/DOSE Aepb Commonly known as:  ADVAIR DISKUS Inhale 1 puff into the lungs 2 (two) times daily. Rinse mouth   ipratropium-albuterol 0.5-2.5 (3) MG/3ML Soln Commonly known as:  DUONEB [name]   lisinopril 20 MG tablet Commonly known as:  PRINIVIL,ZESTRIL [name]        DISCHARGE INSTRUCTIONS:    Follow with  PMD in 1-2 weeks.  If you experience worsening of your admission symptoms, develop shortness of breath, life threatening emergency, suicidal or homicidal thoughts you must seek medical attention immediately by calling 911 or calling your MD immediately  if symptoms less severe.  You Must read complete instructions/literature along with all the possible adverse reactions/side effects for all the Medicines you take and that have been prescribed to you. Take any new Medicines after you have completely understood and accept all the possible adverse reactions/side  effects.   Please note  You were cared for by a hospitalist during your hospital stay. If you have any questions about your discharge medications or the care you received while you were in the hospital after you are discharged, you can call the unit and asked to speak with the hospitalist on call if the hospitalist that took care of you is not available. Once you are discharged, your primary care physician will handle any further medical issues. Please note that NO REFILLS for any discharge medications will be authorized once you are discharged, as it is imperative that you return to your primary care physician (or establish a relationship with a primary care physician if you do not have one) for your aftercare needs so that they can reassess your need for medications and monitor your lab values.    Today   CHIEF COMPLAINT:   Chief Complaint  Patient presents with  . Loss of Consciousness    HISTORY OF PRESENT ILLNESS:  Lauren Koch  is a 67 y.o. female with a known history of HTN, CVA, mild dementia p/w syncope. Pt AAOx3, is at baseline. Answers questions appropriately. Hx assisted by family, as pt is p/w LOC. Pt was out w/ family, was sitting down outside and suddenly slumped over to one side. Tongue was out, pt was sweating, unconsciousness estimated to last 5-67min per family. EMS called. Pt apparently transiently tremulous while regaining consciousness, c/o feeling tired/sleepy after waking. Pt remembers talking to family outside, then waking to EMS. No confusion. Had similar incident ~69mo ago. Mild dementia recently Dx, recently started on Aricept. Good PO intake of food/fluids. Otherwise feels well, w/o complaint. VSS. Physical exam normal. Neurologically nonfocal. Labs demonstrate hyperglycemia (Glu 127), otherwise unimpressive. Cr 1.17, near baseline (0.9-1.1). Trop-I (-). U/A (-) infxn. EKG sinus. CXR (-). CT head (-) acute intracranial abnl. Pt has [employer] Reveal LINQ device  (implanted after prior CVA). Obs, syncope.     VITAL SIGNS:  Blood pressure 122/73, pulse (!) 47, temperature 98.4 F (36.9 C), temperature source Oral, resp. rate 18, height 5' (1.524 m), weight 89.8 kg (198 lb), SpO2 97 %.  I/O:    Intake/Output Summary (Last 24 hours) at 06/25/2017 1311 Last data filed at 06/25/2017 1010 Gross per 24 hour  Intake 1080 ml  Output 650 ml  Net 430 ml    PHYSICAL EXAMINATION:  GENERAL:  67 y.o.-year-old patient lying in the bed with no acute distress.  EYES: Pupils equal, round, reactive to light and accommodation. No scleral icterus. Extraocular muscles intact.  HEENT: Head atraumatic, normocephalic. Oropharynx and nasopharynx clear.  NECK:  Supple, no jugular venous distention. No thyroid enlargement, no tenderness.  LUNGS: Normal breath sounds bilaterally, no wheezing, rales,rhonchi or crepitation. No use of accessory muscles of respiration.  CARDIOVASCULAR: S1, S2 normal. No murmurs, rubs, or gallops.  ABDOMEN: Soft, non-tender, non-distended. Bowel sounds present. No organomegaly or mass.  EXTREMITIES: No pedal edema, cyanosis, or clubbing.  NEUROLOGIC: Cranial nerves II through XII  are intact. Muscle strength 5/5 in all extremities. Sensation intact. Gait not checked.  PSYCHIATRIC: The patient is alert and oriented x 3.  SKIN: No obvious rash, lesion, or ulcer.   DATA REVIEW:   CBC Recent Labs  Lab 06/23/17 2241  WBC 8.0  HGB 14.6  HCT 43.1  PLT 313    Chemistries  Recent Labs  Lab 06/23/17 2241  06/25/17 0900  NA 140   < > 141  K 3.7   < > 3.7  CL 104   < > 108  CO2 27   < > 27  GLUCOSE 127*   < > 103*  BUN 14   < > 18  CREATININE 1.17*   < > 0.86  CALCIUM 9.1   < > 8.4*  AST 21  --   --   ALT 14  --   --   ALKPHOS 69  --   --   BILITOT 0.2*  --   --    < > = values in this interval not displayed.    Cardiac Enzymes Recent Labs  Lab 06/25/17 0231  TROPONINI <0.03    Microbiology Results  Results for orders  placed or performed during the hospital encounter of 03/20/16  MRSA PCR Screening     Status: None   Collection Time: 03/20/16  5:37 PM  Result Value Ref Range Status   MRSA by PCR NEGATIVE NEGATIVE Final    Comment:        The GeneXpert MRSA Assay (FDA approved for NASAL specimens only), is one component of a comprehensive MRSA colonization surveillance program. It is not intended to diagnose MRSA infection nor to guide or monitor treatment for MRSA infections.     RADIOLOGY:  Ct Head Wo Contrast  Result Date: 06/23/2017 CLINICAL DATA:  Seizure, new, nontraumatic, >40 yrs. EXAM: CT HEAD WITHOUT CONTRAST TECHNIQUE: Contiguous axial images were obtained from the base of the skull through the vertex without intravenous contrast. COMPARISON:  Head CT and brain MRI February 2018 FINDINGS: Brain: Stable degree of atrophy and chronic small vessel ischemia. Remote lacunar infarct in right caudate. No intracranial hemorrhage, mass effect, or midline shift. No hydrocephalus. The basilar cisterns are patent. No evidence of territorial infarct or acute ischemia. No extra-axial or intracranial fluid collection. Vascular: Mild atherosclerosis of skull base vasculature without hyperdense vessel. Skull: No fracture or focal lesion. Sinuses/Orbits: Paranasal sinuses and mastoid air cells are clear. The visualized orbits are unremarkable. Other: None. IMPRESSION: 1.  No acute intracranial abnormality. 2. Stable atrophy and chronic small vessel ischemia. Electronically Signed   By: Rubye Oaks M.D.   On: 06/23/2017 23:56   Dg Chest Port 1 View  Result Date: 06/23/2017 CLINICAL DATA:  Short of breath, syncopal episode EXAM: PORTABLE CHEST 1 VIEW COMPARISON:  06/14/2017, 04/12/2017 FINDINGS: Electronic device over the left lower chest. Borderline cardiomegaly. No acute opacity or pleural effusion. Right-sided aortic arch. No pneumothorax. IMPRESSION: No active disease.  Right-sided aortic arch  Electronically Signed   By: Jasmine Pang M.D.   On: 06/23/2017 23:43    EKG:   Orders placed or performed during the hospital encounter of 06/23/17  . EKG 12-Lead  . EKG 12-Lead  . EKG 12-Lead  . EKG 12-Lead  . EKG 12-Lead  . EKG 12-Lead  . EKG 12-Lead  . EKG 12-Lead  . EKG 12-Lead  . EKG 12-Lead      Management plans discussed with the patient, family and they are in agreement.  CODE STATUS:     Code Status Orders  (From admission, onward)        Start     Ordered   06/24/17 0224  Full code  Continuous     06/24/17 0223    Code Status History    Date Active Date Inactive Code Status Order ID Comments User Context   04/12/2017 1433 04/15/2017 1655 Full Code [id number]  Alford Highland, MD ED   03/20/2016 1750 03/23/2016 2147 Full Code [id number]  Floreen Comber, RN Inpatient   05/29/2015 1456 06/03/2015 1738 Full Code [id number]  Elliot Cousin, MD ED      TOTAL TIME TAKING CARE OF THIS PATIENT: 35 minutes.    Altamese Dilling M.D on 06/25/2017 at 1:11 PM  Between 7am to 6pm - Pager - [phone]  After 6pm go to [email or web address] - password [email or web address]  [facility] [place] [facility]  Office  [phone]  CC: Primary care physician; McLean-Scocuzza, Pasty Spillers, MD   Note: This dictation was prepared with Dragon dictation along with smaller phrase technology. Any transcriptional errors that result from this process are unintentional.

## 2017-06-25 NOTE — Plan of Care (Signed)
  Problem: Education: Goal: Knowledge of General Education information will improve Outcome: Progressing   Problem: Clinical Measurements: Goal: Will remain free from infection Outcome: Progressing   Problem: Safety: Goal: Ability to remain free from injury will improve Outcome: Progressing   

## 2017-06-25 NOTE — Progress Notes (Signed)
Pt d/c to home today.  IV removed intact.  D/c paperwork printed and reviewed w/pt.  All medication questions and concerns reviewed and pt states understanding.  All Rx's given to patient. Pt requested to walk out for d/c.     

## 2017-06-25 NOTE — Progress Notes (Addendum)
[facility] called and no Home Health orders in Epic. Faxed attending DC summary to send orders for [facility] and PT with F2F to [facility] Isidoro Donning RN CCM Case Mgmt phone [phone]

## 2017-06-25 NOTE — Progress Notes (Signed)
[facility] - [place] at [facility]   PATIENT NAME: Lauren Koch    MR#:  [id number]  DATE OF BIRTH:  [birth year]  SUBJECTIVE:  CHIEF COMPLAINT:   Chief Complaint  Patient presents with  . Loss of Consciousness    Came with LOS, feels fine now. Had some dehydration. On telemetry.  REVIEW OF SYSTEMS:  CONSTITUTIONAL: No fever, fatigue or weakness.  EYES: No blurred or double vision.  EARS, NOSE, AND THROAT: No tinnitus or ear pain.  RESPIRATORY: No cough, shortness of breath, wheezing or hemoptysis.  CARDIOVASCULAR: No chest pain, orthopnea, edema.  GASTROINTESTINAL: No nausea, vomiting, diarrhea or abdominal pain.  GENITOURINARY: No dysuria, hematuria.  ENDOCRINE: No polyuria, nocturia,  HEMATOLOGY: No anemia, easy bruising or bleeding SKIN: No rash or lesion. MUSCULOSKELETAL: No joint pain or arthritis.   NEUROLOGIC: No tingling, numbness, weakness.  PSYCHIATRY: No anxiety or depression.   ROS  DRUG ALLERGIES:   Allergies  Allergen Reactions  . Prednisone Rash and Other (See Comments)    Arrhythmia, also (Has tolerated hydrocortisone and decadron)     VITALS:  Blood pressure 122/73, pulse (!) 47, temperature 98.4 F (36.9 C), temperature source Oral, resp. rate 18, height 5' (1.524 m), weight 89.8 kg (198 lb), SpO2 97 %.  PHYSICAL EXAMINATION:  GENERAL:  67 y.o.-year-old patient lying in the bed with no acute distress.  EYES: Pupils equal, round, reactive to light and accommodation. No scleral icterus. Extraocular muscles intact.  HEENT: Head atraumatic, normocephalic. Oropharynx and nasopharynx clear.  NECK:  Supple, no jugular venous distention. No thyroid enlargement, no tenderness.  LUNGS: Normal breath sounds bilaterally, no wheezing, rales,rhonchi or crepitation. No use of accessory muscles of respiration.  CARDIOVASCULAR: S1, S2 normal. No murmurs, rubs, or gallops.  ABDOMEN: Soft, nontender, nondistended. Bowel sounds present. No  organomegaly or mass.  EXTREMITIES: No pedal edema, cyanosis, or clubbing.  NEUROLOGIC: Cranial nerves II through XII are intact. Muscle strength 4-5/5 in all extremities. Sensation intact. Gait not checked.  PSYCHIATRIC: The patient is alert and oriented x 3.  SKIN: No obvious rash, lesion, or ulcer.   Physical Exam LABORATORY PANEL:   CBC Recent Labs  Lab 06/23/17 2241  WBC 8.0  HGB 14.6  HCT 43.1  PLT 313   ------------------------------------------------------------------------------------------------------------------  Chemistries  Recent Labs  Lab 06/23/17 2241  06/25/17 0900  NA 140   < > 141  K 3.7   < > 3.7  CL 104   < > 108  CO2 27   < > 27  GLUCOSE 127*   < > 103*  BUN 14   < > 18  CREATININE 1.17*   < > 0.86  CALCIUM 9.1   < > 8.4*  AST 21  --   --   ALT 14  --   --   ALKPHOS 69  --   --   BILITOT 0.2*  --   --    < > = values in this interval not displayed.   ------------------------------------------------------------------------------------------------------------------  Cardiac Enzymes Recent Labs  Lab 06/24/17 2308 06/25/17 0231  TROPONINI <0.03 <0.03   ------------------------------------------------------------------------------------------------------------------  RADIOLOGY:  Ct Head Wo Contrast  Result Date: 06/23/2017 CLINICAL DATA:  Seizure, new, nontraumatic, >40 yrs. EXAM: CT HEAD WITHOUT CONTRAST TECHNIQUE: Contiguous axial images were obtained from the base of the skull through the vertex without intravenous contrast. COMPARISON:  Head CT and brain MRI February 2018 FINDINGS: Brain: Stable degree of atrophy and chronic small vessel ischemia. Remote lacunar infarct  in right caudate. No intracranial hemorrhage, mass effect, or midline shift. No hydrocephalus. The basilar cisterns are patent. No evidence of territorial infarct or acute ischemia. No extra-axial or intracranial fluid collection. Vascular: Mild atherosclerosis of skull base  vasculature without hyperdense vessel. Skull: No fracture or focal lesion. Sinuses/Orbits: Paranasal sinuses and mastoid air cells are clear. The visualized orbits are unremarkable. Other: None. IMPRESSION: 1.  No acute intracranial abnormality. 2. Stable atrophy and chronic small vessel ischemia. Electronically Signed   By: MelaniRubye OaksD.   On: 06/23/2017 23:56   Dg Chest Port 1 View  Result Date: 06/23/2017 CLINICAL DATA:  Short of breath, syncopal episode EXAM: PORTABLE CHEST 1 VIEW COMPARISON:  06/14/2017, 04/12/2017 FINDINGS: Electronic device over the left lower chest. Borderline cardiomegaly. No acute opacity or pleural effusion. Right-sided aortic arch. No pneumothorax. IMPRESSION: No active disease.  Right-sided aortic arch Electronically Signed   By: Jasmine Pang M.D.   On: 06/23/2017 23:43    ASSESSMENT AND PLAN:   Active Problems:   Syncope  * Syncope Concerning for hypertension, orthostasis need to have her reveal device interrogated to rule out arrhythmia Less likely stroke given no focal neurologic deficits -Currently on IV fluids for hypovolemia Would recommend , hold her HCTZ BP stable.  * Hx CVA Head CT w/o acute findings yesterday.  Reveal/Linq will be interrogated  * COPD, active smoker Discussed with family, Smoking cessation recommended,  They are likely enabling her as she has mild dementia  * Chest pain- monitor troponin.  Awaited Monitor interogation report.   All the records are reviewed and case discussed with Care Management/Social Workerr. Management plans discussed with the patient, family and they are in agreement.  CODE STATUS: Full.  TOTAL TIME TAKING CARE OF THIS PATIENT: 35 minutes.     POSSIBLE D/C IN 1 DAYS, DEPENDING ON CLINICAL CONDITION.   Altamese Dilling M.D on 06/25/2017   Between 7am to 6pm - Pager - [phone]  After 6pm go to [email or web address] - password [email or web address]  [facility]  Office   [phone]  CC: Primary care physician; McLean-Scocuzza, Pasty Spillers, MD  Note: This dictation was prepared with Dragon dictation along with smaller phrase technology. Any transcriptional errors that result from this process are unintentional.

## 2017-06-28 ENCOUNTER — Ambulatory Visit (INDEPENDENT_AMBULATORY_CARE_PROVIDER_SITE_OTHER): Payer: Medicare Other | Admitting: *Deleted

## 2017-06-28 DIAGNOSIS — I639 Cerebral infarction, unspecified: Secondary | ICD-10-CM

## 2017-06-28 NOTE — Progress Notes (Signed)
Carelink Summary Report / Loop Recorder 

## 2017-07-08 MED FILL — Perflutren Lipid Microsphere IV Susp 6.52 MG/ML: INTRAVENOUS | Qty: 2 | Status: AC

## 2017-07-19 LAB — CUP PACEART REMOTE DEVICE CHECK
Date Time Interrogation Session: 20190526040841
MDC IDC PG IMPLANT DT: 20180220

## 2017-07-29 ENCOUNTER — Ambulatory Visit (INDEPENDENT_AMBULATORY_CARE_PROVIDER_SITE_OTHER): Payer: Medicare Other | Admitting: *Deleted

## 2017-07-29 DIAGNOSIS — I639 Cerebral infarction, unspecified: Secondary | ICD-10-CM

## 2017-07-29 NOTE — Progress Notes (Signed)
Carelink Summary Report / Loop Recorder 

## 2017-08-10 ENCOUNTER — Telehealth: Payer: Self-pay | Admitting: Internal Medicine

## 2017-08-10 NOTE — Telephone Encounter (Signed)
Call pt has not completed cologaurd test  Please do on Monday and mail back    TMS

## 2017-08-10 NOTE — Telephone Encounter (Signed)
mychart message has been sent to inform patient. 

## 2017-08-26 LAB — CUP PACEART REMOTE DEVICE CHECK
Date Time Interrogation Session: 20190628080925
MDC IDC PG IMPLANT DT: 20180220

## 2017-08-29 ENCOUNTER — Ambulatory Visit (INDEPENDENT_AMBULATORY_CARE_PROVIDER_SITE_OTHER): Payer: Medicare Other | Admitting: *Deleted

## 2017-08-29 DIAGNOSIS — I639 Cerebral infarction, unspecified: Secondary | ICD-10-CM | POA: Diagnosis not present

## 2017-08-31 NOTE — Progress Notes (Signed)
Carelink Summary Report / Loop Recorder 

## 2017-09-26 ENCOUNTER — Ambulatory Visit: Admitting: Internal Medicine

## 2017-10-04 ENCOUNTER — Ambulatory Visit (INDEPENDENT_AMBULATORY_CARE_PROVIDER_SITE_OTHER): Payer: Medicare Other | Admitting: *Deleted

## 2017-10-04 DIAGNOSIS — I639 Cerebral infarction, unspecified: Secondary | ICD-10-CM

## 2017-10-04 DIAGNOSIS — R55 Syncope and collapse: Secondary | ICD-10-CM

## 2017-10-04 NOTE — Progress Notes (Signed)
Carelink Summary Report / Loop Recorder 

## 2017-10-10 LAB — CUP PACEART REMOTE DEVICE CHECK
Date Time Interrogation Session: 20190731080512
Implantable Pulse Generator Implant Date: 20180220

## 2017-10-28 LAB — CUP PACEART REMOTE DEVICE CHECK
Date Time Interrogation Session: 20190902100827
MDC IDC PG IMPLANT DT: 20180220

## 2017-11-07 ENCOUNTER — Ambulatory Visit (INDEPENDENT_AMBULATORY_CARE_PROVIDER_SITE_OTHER): Payer: Medicare Other | Admitting: *Deleted

## 2017-11-07 DIAGNOSIS — R55 Syncope and collapse: Secondary | ICD-10-CM

## 2017-11-07 DIAGNOSIS — I639 Cerebral infarction, unspecified: Secondary | ICD-10-CM

## 2017-11-07 NOTE — Progress Notes (Signed)
Carelink Summary Report / Loop Recorder 

## 2017-11-21 LAB — CUP PACEART REMOTE DEVICE CHECK
MDC IDC PG IMPLANT DT: 20180220
MDC IDC SESS DTM: 20191005100646

## 2017-11-25 ENCOUNTER — Other Ambulatory Visit: Payer: Self-pay

## 2017-11-25 ENCOUNTER — Inpatient Hospital Stay (HOSPITAL_COMMUNITY)
Admission: EM | Admit: 2017-11-25 | Discharge: 2017-11-28 | DRG: 190 | Disposition: A | Discharge status: Home / Self Care | Payer: Medicare Other | Attending: Internal Medicine | Admitting: Internal Medicine

## 2017-11-25 ENCOUNTER — Encounter (HOSPITAL_COMMUNITY): Payer: Self-pay | Admitting: *Deleted

## 2017-11-25 ENCOUNTER — Emergency Department (HOSPITAL_COMMUNITY): Payer: Medicare Other

## 2017-11-25 DIAGNOSIS — I5032 Chronic diastolic (congestive) heart failure: Secondary | ICD-10-CM | POA: Diagnosis not present

## 2017-11-25 DIAGNOSIS — Z8349 Family history of other endocrine, nutritional and metabolic diseases: Secondary | ICD-10-CM

## 2017-11-25 DIAGNOSIS — I110 Hypertensive heart disease with heart failure: Secondary | ICD-10-CM | POA: Diagnosis not present

## 2017-11-25 DIAGNOSIS — Z833 Family history of diabetes mellitus: Secondary | ICD-10-CM

## 2017-11-25 DIAGNOSIS — J441 Chronic obstructive pulmonary disease with (acute) exacerbation: Secondary | ICD-10-CM | POA: Diagnosis not present

## 2017-11-25 DIAGNOSIS — Z8673 Personal history of transient ischemic attack (TIA), and cerebral infarction without residual deficits: Secondary | ICD-10-CM

## 2017-11-25 DIAGNOSIS — Z888 Allergy status to other drugs, medicaments and biological substances status: Secondary | ICD-10-CM

## 2017-11-25 DIAGNOSIS — R0600 Dyspnea, unspecified: Secondary | ICD-10-CM | POA: Diagnosis present

## 2017-11-25 DIAGNOSIS — Z823 Family history of stroke: Secondary | ICD-10-CM

## 2017-11-25 DIAGNOSIS — Z9981 Dependence on supplemental oxygen: Secondary | ICD-10-CM

## 2017-11-25 DIAGNOSIS — E785 Hyperlipidemia, unspecified: Secondary | ICD-10-CM | POA: Diagnosis not present

## 2017-11-25 DIAGNOSIS — R000 Tachycardia, unspecified: Secondary | ICD-10-CM | POA: Diagnosis not present

## 2017-11-25 DIAGNOSIS — F0390 Unspecified dementia without behavioral disturbance: Secondary | ICD-10-CM | POA: Diagnosis present

## 2017-11-25 DIAGNOSIS — J9621 Acute and chronic respiratory failure with hypoxia: Secondary | ICD-10-CM | POA: Diagnosis present

## 2017-11-25 DIAGNOSIS — K219 Gastro-esophageal reflux disease without esophagitis: Secondary | ICD-10-CM | POA: Diagnosis present

## 2017-11-25 DIAGNOSIS — Z7951 Long term (current) use of inhaled steroids: Secondary | ICD-10-CM

## 2017-11-25 DIAGNOSIS — Z79899 Other long term (current) drug therapy: Secondary | ICD-10-CM

## 2017-11-25 DIAGNOSIS — E876 Hypokalemia: Secondary | ICD-10-CM | POA: Diagnosis not present

## 2017-11-25 DIAGNOSIS — R7302 Impaired glucose tolerance (oral): Secondary | ICD-10-CM | POA: Diagnosis present

## 2017-11-25 DIAGNOSIS — Z8249 Family history of ischemic heart disease and other diseases of the circulatory system: Secondary | ICD-10-CM

## 2017-11-25 DIAGNOSIS — Z90710 Acquired absence of both cervix and uterus: Secondary | ICD-10-CM

## 2017-11-25 DIAGNOSIS — F17210 Nicotine dependence, cigarettes, uncomplicated: Secondary | ICD-10-CM | POA: Diagnosis present

## 2017-11-25 DIAGNOSIS — Z808 Family history of malignant neoplasm of other organs or systems: Secondary | ICD-10-CM

## 2017-11-25 DIAGNOSIS — I10 Essential (primary) hypertension: Secondary | ICD-10-CM | POA: Diagnosis present

## 2017-11-25 DIAGNOSIS — T380X5A Adverse effect of glucocorticoids and synthetic analogues, initial encounter: Secondary | ICD-10-CM | POA: Diagnosis present

## 2017-11-25 DIAGNOSIS — R0602 Shortness of breath: Secondary | ICD-10-CM | POA: Diagnosis not present

## 2017-11-25 LAB — COMPREHENSIVE METABOLIC PANEL
ALBUMIN: 3.7 g/dL (ref 3.5–5.0)
ALT: 16 U/L (ref 0–44)
AST: 21 U/L (ref 15–41)
Alkaline Phosphatase: 76 U/L (ref 38–126)
Anion gap: 8 (ref 5–15)
BUN: 13 mg/dL (ref 8–23)
CO2: 24 mmol/L (ref 22–32)
CREATININE: 0.81 mg/dL (ref 0.44–1.00)
Calcium: 8.7 mg/dL — ABNORMAL LOW (ref 8.9–10.3)
Chloride: 106 mmol/L (ref 98–111)
GFR calc non Af Amer: >60 mL/min (ref >60)
GLUCOSE: 111 mg/dL — AB (ref 70–99)
Potassium: 3.9 mmol/L (ref 3.5–5.1)
SODIUM: 138 mmol/L (ref 135–145)
TOTAL PROTEIN: 7.1 g/dL (ref 6.5–8.1)
Total Bilirubin: 0.6 mg/dL (ref 0.3–1.2)

## 2017-11-25 LAB — CBC WITH DIFFERENTIAL/PLATELET
Abs Immature Granulocytes: 0.05 10*3/uL (ref 0.00–0.07)
BASOS ABS: 0 10*3/uL (ref 0.0–0.1)
Basophils Relative: 0 %
EOS PCT: 0 %
Eosinophils Absolute: 0.1 10*3/uL (ref 0.0–0.5)
HCT: 40.7 % (ref 36.0–46.0)
Hemoglobin: 13.3 g/dL (ref 12.0–15.0)
Immature Granulocytes: 0 %
Lymphocytes Relative: 8 %
Lymphs Abs: 1 10*3/uL (ref 0.7–4.0)
MCH: 28.2 pg (ref 26.0–34.0)
MCHC: 32.7 g/dL (ref 30.0–36.0)
MCV: 86.4 fL (ref 80.0–100.0)
MONO ABS: 1.2 10*3/uL — AB (ref 0.1–1.0)
Monocytes Relative: 10 %
NEUTROS ABS: 10.1 10*3/uL — AB (ref 1.7–7.7)
NRBC: 0 % (ref 0.0–0.2)
Neutrophils Relative %: 82 %
PLATELETS: 296 10*3/uL (ref 150–400)
RBC: 4.71 MIL/uL (ref 3.87–5.11)
RDW: 14.3 % (ref 11.5–15.5)
WBC: 12.3 10*3/uL — AB (ref 4.0–10.5)

## 2017-11-25 LAB — TROPONIN I: Troponin I: <0.03 ng/mL (ref <0.03)

## 2017-11-25 LAB — BRAIN NATRIURETIC PEPTIDE: B NATRIURETIC PEPTIDE 5: 205 pg/mL — AB (ref 0.0–100.0)

## 2017-11-25 MED ORDER — ACETAMINOPHEN 325 MG PO TABS: 650.0000 mg | ORAL_TABLET | Freq: Four times a day (QID) | ORAL | Status: DC | PRN

## 2017-11-25 MED ORDER — ALBUTEROL (5 MG/ML) CONTINUOUS INHALATION SOLN
10.0000 mg/h | INHALATION_SOLUTION | RESPIRATORY_TRACT | Status: DC
  Administered 2017-11-25: 10 mg/h via RESPIRATORY_TRACT
  Filled 2017-11-25: qty 20

## 2017-11-25 MED ORDER — ALBUTEROL SULFATE (2.5 MG/3ML) 0.083% IN NEBU
5.0000 mg | INHALATION_SOLUTION | Freq: Once | RESPIRATORY_TRACT | Status: AC
  Administered 2017-11-25: 5 mg via RESPIRATORY_TRACT
  Filled 2017-11-25: qty 6

## 2017-11-25 MED ORDER — ALBUTEROL SULFATE (2.5 MG/3ML) 0.083% IN NEBU
2.5000 mg | INHALATION_SOLUTION | Freq: Four times a day (QID) | RESPIRATORY_TRACT | Status: DC
  Administered 2017-11-26: 2.5 mg via RESPIRATORY_TRACT
  Filled 2017-11-25: qty 3

## 2017-11-25 MED ORDER — TIOTROPIUM BROMIDE MONOHYDRATE 18 MCG IN CAPS
18.0000 ug | ORAL_CAPSULE | Freq: Every day | RESPIRATORY_TRACT | Status: DC
  Filled 2017-11-25: qty 5

## 2017-11-25 MED ORDER — ALBUTEROL SULFATE (2.5 MG/3ML) 0.083% IN NEBU: 2.5000 mg | INHALATION_SOLUTION | Freq: Four times a day (QID) | RESPIRATORY_TRACT | Status: DC | PRN

## 2017-11-25 MED ORDER — METHYLPREDNISOLONE SODIUM SUCC 125 MG IJ SOLR
125.0000 mg | Freq: Once | INTRAMUSCULAR | Status: AC
  Administered 2017-11-25: 125 mg via INTRAVENOUS
  Filled 2017-11-25: qty 2

## 2017-11-25 MED ORDER — SODIUM CHLORIDE 0.9% FLUSH: 3.0000 mL | INTRAVENOUS | Status: DC | PRN

## 2017-11-25 MED ORDER — SODIUM CHLORIDE 0.9 % IV SOLN: 250.0000 mL | INTRAVENOUS | Status: DC | PRN

## 2017-11-25 MED ORDER — ALBUTEROL SULFATE (2.5 MG/3ML) 0.083% IN NEBU: 2.5000 mg | INHALATION_SOLUTION | Freq: Once | RESPIRATORY_TRACT | Status: DC

## 2017-11-25 MED ORDER — DOXYCYCLINE HYCLATE 100 MG IV SOLR
100.0000 mg | Freq: Once | INTRAVENOUS | Status: AC
Start: 2017-11-25 — End: 2017-11-26
  Administered 2017-11-25: 100 mg via INTRAVENOUS
  Filled 2017-11-25 (×2): qty 100

## 2017-11-25 MED ORDER — ENOXAPARIN SODIUM 40 MG/0.4ML ~~LOC~~ SOLN
40.0000 mg | SUBCUTANEOUS | Status: DC
  Administered 2017-11-26 – 2017-11-27 (×2): 40 mg via SUBCUTANEOUS
  Filled 2017-11-25 (×2): qty 0.4

## 2017-11-25 MED ORDER — SODIUM CHLORIDE 0.9 % IV SOLN
500.0000 mg | INTRAVENOUS | Status: DC
  Administered 2017-11-26: 500 mg via INTRAVENOUS
  Filled 2017-11-25 (×3): qty 500

## 2017-11-25 MED ORDER — ACETAMINOPHEN 650 MG RE SUPP: 650.0000 mg | Freq: Four times a day (QID) | RECTAL | Status: DC | PRN

## 2017-11-25 MED ORDER — METHYLPREDNISOLONE SODIUM SUCC 125 MG IJ SOLR: 80.0000 mg | Freq: Three times a day (TID) | INTRAMUSCULAR | Status: DC

## 2017-11-25 MED ORDER — SODIUM CHLORIDE 0.9% FLUSH
3.0000 mL | Freq: Two times a day (BID) | INTRAVENOUS | Status: DC
  Administered 2017-11-26 – 2017-11-28 (×4): 3 mL via INTRAVENOUS

## 2017-11-25 MED ORDER — SODIUM CHLORIDE 0.9 % IV SOLN
INTRAVENOUS | Status: DC | PRN
  Administered 2017-11-25: 250 mL via INTRAVENOUS

## 2017-11-25 MED ORDER — FLUTICASONE FUROATE-VILANTEROL 200-25 MCG/INH IN AEPB
1.0000 | INHALATION_SPRAY | Freq: Every day | RESPIRATORY_TRACT | Status: DC
  Filled 2017-11-25: qty 28

## 2017-11-25 NOTE — ED Notes (Signed)
Patient given microwave  meal an soda.

## 2017-11-25 NOTE — ED Triage Notes (Signed)
Shortness of breath

## 2017-11-25 NOTE — ED Notes (Signed)
Respiratory notified.

## 2017-11-25 NOTE — ED Provider Notes (Signed)
[facility] EMERGENCY DEPARTMENT Provider Note   CSN: [id number] Arrival date & time: 11/25/17  1925     History   Chief Complaint Chief Complaint  Patient presents with  . Shortness of Breath    HPI Lauren Koch is a 67 y.o. female.  HPI Patient with history of COPD on 2 L of home O2 as needed.  Presents with increasing shortness of breath worsening today.  Associated with dry nonproductive cough.  Patient has some central chest pain with cough and deep breathing.  Has been using home nebulizer with little relief.  No fever or chills.  No new lower extremity swelling or pain. Past Medical History:  Diagnosis Date  . Asthma   . Cataract   . COPD (chronic obstructive pulmonary disease) (HCC)   . Cryptogenic Stroke (HCC)    a. 03/2016 L ACA, L MCX, L PCA, and R dorsal pontine territory infarcts; b. 03/2016 Carotid U/S: 1-39% bil dzs; c. 03/2016 TEE: EF 60-65%, no rwma, no LA/LAA/RA/RAA thrombus. No PFO; d. 03/2016 s/p MDT Reveal Linq (ser# [id number]) - no AFib noted to date (some sinus w/ freq PACs).  . Dementia (HCC)   . Diastolic dysfunction    a. 03/2016 Echo: EF 60-65%, gr1 DD, nl RV fxn.  Marland Kitchen GERD (gastroesophageal reflux disease)   . Hypertension   . Smoker     Patient Active Problem List   Diagnosis Date Noted  . Dyspnea 11/25/2017  . Syncope 06/24/2017  . Memory loss 06/10/2017  . Abnormal thyroid function test 06/10/2017  . Breast cyst, left 06/10/2017  . Hyperlipemia 03/25/2016  . 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 Osteopenia 08/08/2015  . Pulmonary emphysema (HCC)   . Hyperglycemia 05/30/2015  . Abnormal TSH 05/30/2015  . CAP (community acquired pneumonia) 05/29/2015  . Acute respiratory failure with hypoxia (HCC) 05/29/2015  . COPD exacerbation (HCC) 05/29/2015  . Acute kidney injury (HCC) 05/29/2015  . Morbid obesity (HCC) 05/29/2015  . Tobacco abuse 05/29/2015  . Right leg pain 05/29/2015  .  Essential hypertension 05/29/2015  . Vitamin D deficiency 04/17/2015  . Smoker 03/03/2015  . Asthma   . Cataract   . COPD (chronic obstructive pulmonary disease) (HCC)   . Hypertension   . GERD (gastroesophageal reflux disease)     Past Surgical History:  Procedure Laterality Date  . ABDOMINAL HYSTERECTOMY     partial   . CESAREAN SECTION  1971/1976  . HERNIA REPAIR  2013  . LOOP RECORDER INSERTION N/A 03/23/2016   Procedure: Loop Recorder Insertion;  [name], MD;  [name];  Service: Cardiovascular;  [name];  . PARTIAL HYMENECTOMY    . TEE WITHOUT CARDIOVERSION N/A 03/23/2016   Procedure: TRANSESOPHAGEAL ECHOCARDIOGRAM (TEE);  [name], MD;  Location: [facility] ENDOSCOPY;  Service: Cardiovascular;  [name];     OB History    Gravida  2   Para  2   Term  2   Preterm      AB      Living  2     SAB      TAB      Ectopic      Multiple      Live Births               Home Medications    Prior to Admission medications   Medication Sig Start Date End Date Taking? Authorizing Provider  albuterol (PROVENTIL HFA;[name] [name] 03/03/15  Yes Allayne Butcher B, PA-C  atorvastatin (LIPITOR) 40 MG tablet [name] 06/10/17  Yes McLean-Scocuzza, Pasty Spillers, MD  donepezil (ARICEPT) 5 MG tablet [name] 06/10/17  Yes McLean-Scocuzza, Pasty Spillers, MD  Fluticasone-Salmeterol (ADVAIR DISKUS) 250-50 MCG/DOSE AEPB Inhale 1 puff into the lungs 2 (two) times daily. Rinse mouth 06/10/17  Yes McLean-Scocuzza, Pasty Spillers, MD  ipratropium-albuterol (DUONEB) 0.5-2.5 (3) MG/3ML SOLN [name] 06/10/17  Yes McLean-Scocuzza, Pasty Spillers, MD  lisinopril (PRINIVIL,[name] Take 20 mg by mouth in  the Lauren 06/10/17  Yes McLean-Scocuzza, Pasty Spillers, MD    Family History Family History  Problem Relation Age of Onset  . Diabetes Mother   . Hypertension Mother   . Miscarriages / [place] Mother   . Hyperlipidemia Mother   . Heart disease Father   . Diabetes Father   . Hypertension Father   . Hyperlipidemia Father   . Stroke Father   . Cancer Sister        skin  . Diabetes Other   . Hyperlipidemia Daughter     Social History Social History   Tobacco Use  . Smoking status: Current Some Day Smoker    Packs/day: 1.00    Years: 30.00    Pack years: 30.00    Types: Cigarettes  . Smokeless tobacco: Former Neurosurgeon    Quit date: 05/29/2015  Substance Use Topics  . Alcohol use: Yes    Alcohol/week: 0.0 standard drinks    Comment: occasionally  . Drug use: No     Allergies   Prednisone   Review of Systems Review of Systems  Constitutional: Negative for chills, fatigue and fever.  Respiratory: Positive for cough and shortness of breath.   Cardiovascular: Positive for chest pain. Negative for palpitations and leg swelling.  Gastrointestinal: Negative for abdominal pain, diarrhea, nausea and vomiting.  Genitourinary: Negative for dysuria, flank pain and frequency.  Musculoskeletal: Negative for back pain, myalgias and neck pain.  Skin: Negative for rash and wound.  Neurological: Negative for dizziness, weakness, light-headedness, numbness and headaches.  All other systems reviewed and are negative.    Physical Exam Updated Vital Signs BP 121/69   Pulse (!) 121   Temp 98 F (36.7 C) (Oral)   Resp (!) 21   Ht 5\' 1"  (1.549 m)   Wt 96.5 kg   SpO2 97%   BMI 40.21 kg/m   Physical Exam  Constitutional: She is oriented to person, place, and time. She appears well-developed and well-nourished. She appears distressed.  HENT:  Head: Normocephalic and atraumatic.  Mouth/Throat: Oropharynx is clear and moist. No oropharyngeal exudate.  Eyes: Pupils are equal, round,  and reactive to light. EOM are normal.  Neck: Normal range of motion. Neck supple.  Cardiovascular: Normal rate and regular rhythm.  Pulmonary/Chest:  Increased work of breathing.  Diffuse expiratory wheezes.  Abdominal: Soft. Bowel sounds are normal. There is no tenderness. There is no rebound and no guarding.  Musculoskeletal: Normal range of motion. She exhibits no edema or tenderness.  No lower extremity swelling, asymmetry or tenderness.  Distal pulses intact.  Neurological: She is alert and oriented to person, place, and time.  Skin: Skin is  warm and dry. Capillary refill takes less than 2 seconds. No rash noted. She is not diaphoretic. No erythema.  Psychiatric: She has a normal mood and affect. Her behavior is normal.  Nursing note and vitals reviewed.    ED Treatments / Results  Labs (all labs ordered are listed, but only abnormal results are displayed) Labs Reviewed  CBC WITH DIFFERENTIAL/PLATELET - Abnormal; Notable for the following components:      Result Value   WBC 12.3 (*)    Neutro Abs 10.1 (*)    Monocytes Absolute 1.2 (*)    All other components within normal limits  COMPREHENSIVE METABOLIC PANEL - Abnormal; Notable for the following components:   Glucose, Bld 111 (*)    Calcium 8.7 (*)    All other components within normal limits  BRAIN NATRIURETIC PEPTIDE - Abnormal; Notable for the following components:   B Natriuretic Peptide 205.0 (*)    All other components within normal limits  TROPONIN I  COMPREHENSIVE METABOLIC PANEL  CBC  D-DIMER, QUANTITATIVE (NOT AT [facility])    EKG None  Radiology Dg Chest Port 1 View  Result Date: 11/25/2017 CLINICAL DATA:  Shortness of breath EXAM: PORTABLE CHEST 1 VIEW COMPARISON:  06/23/2017, CT 04/12/2017 FINDINGS: Mild cardiomegaly with right-sided aortic arch. No focal opacity or pleural effusion. No pneumothorax. IMPRESSION: No active disease.  Cardiomegaly with right-sided aortic arch Electronically Signed   By: Jasmine Pang M.D.   On: 11/25/2017 20:14    Procedures Procedures (including critical care time)  Medications Ordered in ED Medications  albuterol (PROVENTIL,VENTOLIN) solution continuous neb (0 mg/hr Nebulization Stopped 11/25/17 2239)  doxycycline (VIBRAMYCIN) 100 mg in sodium chloride 0.9 % 250 mL IVPB (has no administration in time range)  0.9 %  sodium chloride infusion (has no administration in time range)  enoxaparin (LOVENOX) injection 40 mg (has no administration in time range)  sodium chloride flush (NS) 0.9 % injection 3 mL (has no administration in time range)  sodium chloride flush (NS) 0.9 % injection 3 mL (has no administration in time range)  0.9 %  sodium chloride infusion (has no administration in time range)  acetaminophen (TYLENOL) tablet 650 mg (has no administration in time range)    Or  acetaminophen (TYLENOL) suppository 650 mg (has no administration in time range)  tiotropium (SPIRIVA) inhalation capsule ([facility] use ONLY) 18 mcg (has no administration in time range)  methylPREDNISolone sodium succinate (SOLU-MEDROL) 125 mg/2 mL injection 80 mg (has no administration in time range)  fluticasone furoate-vilanterol (BREO ELLIPTA) 200-25 MCG/INH 1 puff (has no administration in time range)  azithromycin (ZITHROMAX) 500 mg in sodium chloride 0.9 % 250 mL IVPB (has no administration in time range)  albuterol (PROVENTIL) (2.5 MG/3ML) 0.083% nebulizer solution 5 mg (5 mg Nebulization Given 11/25/17 2012)  methylPREDNISolone sodium succinate (SOLU-MEDROL) 125 mg/2 mL injection 125 mg (125 mg Intravenous Given 11/25/17 2020)    CRITICAL CARE Performed by: Loren Racer Total critical care time: 25 minutes Critical care time was exclusive of separately billable procedures and treating other patients. Critical care was necessary to treat or prevent imminent or life-threatening deterioration. Critical care was time spent personally by me on the following activities: development  of treatment plan with patient and/or surrogate as well as nursing, discussions with consultants, evaluation of patient's response to treatment, examination of patient, obtaining history from patient or surrogate, ordering and performing treatments and interventions, ordering and review of laboratory studies, ordering and review of radiographic studies, pulse oximetry and  re-evaluation of patient's condition. Initial Impression / Assessment and Plan / ED Course  I have reviewed the triage vital signs and the nursing notes.  Pertinent labs & imaging results that were available during my care of the patient were reviewed by me and considered in my medical decision making (see chart for details).    Patient with persistent dyspnea and wheezing despite multiple nebulized treatments.  Also given IV Solu-Medrol.  X-ray without acute findings.  Given history of severe COPD will cover with doxycycline.  Discussed with hospitalist will see patient in the emergency department and admit.   Final Clinical Impressions(s) / ED Diagnoses   Final diagnoses:  COPD exacerbation [facility])    ED Discharge Orders    None       Loren Racer, MD 11/25/17 2318

## 2017-11-25 NOTE — H&P (Signed)
TRH H&P   Patient Demographics:    Lauren Koch, is a 67 y.o. female  MRN: [id number]   DOB - [birth year]  Admit Date - 11/25/2017  Outpatient Primary MD for the patient is McLean-Scocuzza, Pasty Spillers, MD  Referring MD/NP/PA: Loren Racer  Outpatient Specialists:   Patient coming from: home  Chief Complaint  Patient presents with  . Shortness of Breath      HPI:    Lauren Koch  is a 67 y.o. female, w Dementia, Genella Rife, Hypertension, Smoker, Copd / Asthma on home o2,  Apparently presents with c/o dyspnea starting earlier today. Pt notes dry cough and wheezing.  Pt denies fever, chills, cp, palp, n/v, diarrhea, brbpr, black stool, dysuria.  Pt continues to smoke.   In Ed,  T 98.2  P 121 R 27  Bp 187/97 pox 93-99% on 2L [place]  CXR IMPRESSION: No active disease.  Cardiomegaly with right-sided aortic arch  Wbc 12.3, Hgb 13.3, Plt 296  Na 138, K 3.9, Bun 13, Creatinine 0.81   Ast 21, Alt 16  BNP 205.0  Trop <0.03  Pt will be admitted for dyspnea secondary to Copd exacerbation.        Review of systems:    In addition to the HPI above,  No Fever-chills, No Headache, No changes with Vision or hearing, No problems swallowing food or Liquids, No Chest pain,  No Abdominal pain, No Nausea or Vommitting, Bowel movements are regular, No Blood in stool or Urine, No dysuria, No new skin rashes or bruises, No new joints pains-aches,  No new weakness, tingling, numbness in any extremity, No recent weight gain or loss, No polyuria, polydypsia or polyphagia, No significant Mental Stressors.  A full 10 point Review of Systems was done, except as stated above, all other Review of Systems were negative.   With Past History of the following :    Past Medical History:  Diagnosis Date  . Asthma   . Cataract   . COPD (chronic obstructive pulmonary disease) (HCC)   .  Cryptogenic Stroke (HCC)    a. 03/2016 L ACA, L MCX, L PCA, and R dorsal pontine territory infarcts; b. 03/2016 Carotid U/S: 1-39% bil dzs; c. 03/2016 TEE: EF 60-65%, no rwma, no LA/LAA/RA/RAA thrombus. No PFO; d. 03/2016 s/p MDT Reveal Linq (ser# [id number]) - no AFib noted to date (some sinus w/ freq PACs).  . Dementia (HCC)   . Diastolic dysfunction    a. 03/2016 Echo: EF 60-65%, gr1 DD, nl RV fxn.  Marland Kitchen GERD (gastroesophageal reflux disease)   . Hypertension   . Smoker       Past Surgical History:  Procedure Laterality Date  . ABDOMINAL HYSTERECTOMY     partial   . CESAREAN SECTION  1971/1976  . HERNIA REPAIR  2013  . LOOP RECORDER INSERTION N/A 03/23/2016   Procedure: Loop Recorder Insertion;  Surgeon:  Marinus Maw, MD;  Location: [facility] INVASIVE CV LAB;  Service: Cardiovascular;  [name];  . PARTIAL HYMENECTOMY    . TEE WITHOUT CARDIOVERSION N/A 03/23/2016   Procedure: TRANSESOPHAGEAL ECHOCARDIOGRAM (TEE);  [name], MD;  Location: [facility] ENDOSCOPY;  Service: Cardiovascular;  [name];      Social History:     Social History   Tobacco Use  . Smoking status: Current Some Day Smoker    Packs/day: 1.00    Years: 30.00    Pack years: 30.00    Types: Cigarettes  . Smokeless tobacco: Former Neurosurgeon    Quit date: 05/29/2015  Substance Use Topics  . Alcohol use: Yes    Alcohol/week: 0.0 standard drinks    Comment: occasionally     Lives - at home  Mobility - walks by self   Family History :     Family History  Problem Relation Age of Onset  . Diabetes Mother   . Hypertension Mother   . Miscarriages / [place] Mother   . Hyperlipidemia Mother   . Heart disease Father   . Diabetes Father   . Hypertension Father   . Hyperlipidemia Father   . Stroke Father   . Cancer Sister        skin  . Diabetes Other   . Hyperlipidemia Daughter      Home Medications:   Prior to Admission medications   Medication Sig Start Date End Date Taking?  Authorizing Provider  albuterol (PROVENTIL HFA;[name] [name] 03/03/15  Yes Allayne Butcher B, PA-C  atorvastatin (LIPITOR) 40 MG tablet [name] 06/10/17  Yes McLean-Scocuzza, Pasty Spillers, MD  donepezil (ARICEPT) 5 MG tablet [name] 06/10/17  Yes McLean-Scocuzza, Pasty Spillers, MD  Fluticasone-Salmeterol (ADVAIR DISKUS) 250-50 MCG/DOSE AEPB Inhale 1 puff into the lungs 2 (two) times daily. Rinse mouth 06/10/17  Yes McLean-Scocuzza, Pasty Spillers, MD  ipratropium-albuterol (DUONEB) 0.5-2.5 (3) MG/3ML SOLN [name] 06/10/17  Yes McLean-Scocuzza, Pasty Spillers, MD  lisinopril (PRINIVIL,[name] [name] 06/10/17  Yes McLean-Scocuzza, Pasty Spillers, MD     Allergies:     Allergies  Allergen Reactions  . Prednisone Rash and Other (See Comments)    Arrhythmia, also (Has tolerated hydrocortisone and decadron)      Physical Exam:   Vitals  Blood pressure 121/69, pulse (!) 121, temperature 98 F (36.7 C), temperature source Oral, resp. rate (!) 21, height 5\' 1"  (1.549 m), weight 96.5 kg, SpO2 97 %.   1. General  lying in bed appearing dyspneic  2. Normal affect and insight, Not Suicidal or Homicidal, Awake Alert, Oriented X 3.  3. No F.N deficits, ALL C.Nerves Intact, Strength 5/5 all 4 extremities, Sensation intact all 4 extremities, Plantars down going.  4. Ears and Eyes appear Normal, Conjunctivae clear, PERRLA. Moist Oral Mucosa.  5. Supple Neck, No JVD, No cervical lymphadenopathy appriciated, No Carotid Bruits.  6. Symmetrical Chest wall movement, + bilateral wheezing, no crackles  7. Tachy s1, s2  8. Positive Bowel Sounds, Abdomen Soft, No tenderness, No organomegaly appriciated,No rebound -guarding or rigidity.  9.  No Cyanosis,  Normal Skin Turgor, No Skin Rash or Bruise.  10. Good muscle  tone,  joints appear normal , no effusions, Normal ROM.  11. No Palpable Lymph Nodes in Neck or Axillae     Data Review:    CBC Recent Labs  Lab 11/25/17 2017  WBC 12.3*  HGB 13.3  HCT 40.7  PLT 296  MCV 86.4  MCH 28.2  MCHC 32.7  RDW 14.3  LYMPHSABS 1.0  MONOABS 1.2*  EOSABS 0.1  BASOSABS 0.0   ------------------------------------------------------------------------------------------------------------------  Chemistries  Recent Labs  Lab 11/25/17 2017  NA 138  K 3.9  CL 106  CO2 24  GLUCOSE 111*  BUN 13  CREATININE 0.81  CALCIUM 8.7*  AST 21  ALT 16  ALKPHOS 76  BILITOT 0.6   ------------------------------------------------------------------------------------------------------------------ estimated creatinine clearance is 72.6 mL/min (by C-G formula based on SCr of 0.81 mg/dL). ------------------------------------------------------------------------------------------------------------------ No results for input(s): TSH, T4TOTAL, T3FREE, THYROIDAB in the last 72 hours.  Invalid input(s): FREET3  Coagulation profile No results for input(s): INR, PROTIME in the last 168 hours. ------------------------------------------------------------------------------------------------------------------- No results for input(s): DDIMER in the last 72 hours. -------------------------------------------------------------------------------------------------------------------  Cardiac Enzymes Recent Labs  Lab 11/25/17 2017  TROPONINI <0.03   ------------------------------------------------------------------------------------------------------------------    Component Value Date/Time   BNP 205.0 (H) 11/25/2017 2017     ---------------------------------------------------------------------------------------------------------------  Urinalysis    Component Value Date/Time   COLORURINE YELLOW (A) 06/23/2017  2357   APPEARANCEUR CLEAR (A) 06/23/2017 2357   APPEARANCEUR Turbid (A) 06/10/2017 1005   LABSPEC 1.019 06/23/2017 2357   PHURINE 5.0 06/23/2017 2357   GLUCOSEU NEGATIVE 06/23/2017 2357   HGBUR NEGATIVE 06/23/2017 2357   BILIRUBINUR NEGATIVE 06/23/2017 2357   BILIRUBINUR Negative 06/10/2017 1005   KETONESUR NEGATIVE 06/23/2017 2357   PROTEINUR NEGATIVE 06/23/2017 2357   NITRITE NEGATIVE 06/23/2017 2357   LEUKOCYTESUR NEGATIVE 06/23/2017 2357   LEUKOCYTESUR Negative 06/10/2017 1005    ----------------------------------------------------------------------------------------------------------------   Imaging Results:    Dg Chest Port 1 View  Result Date: 11/25/2017 CLINICAL DATA:  Shortness of breath EXAM: PORTABLE CHEST 1 VIEW COMPARISON:  06/23/2017, CT 04/12/2017 FINDINGS: Mild cardiomegaly with right-sided aortic arch. No focal opacity or pleural effusion. No pneumothorax. IMPRESSION: No active disease.  Cardiomegaly with right-sided aortic arch Electronically Signed   By: Jasmine Pang M.D.   On: 11/25/2017 20:14       Assessment & Plan:    Principal Problem:   Dyspnea Active Problems:   COPD with acute exacerbation (HCC)   Tachycardia    Dyspnea likely Copd exacerbation Check cardiac echo Solumedrol 80mg  iv q8h spiriva 1puff qday Breo 1puff qday Albuterol neb q6h and q6h prn   Tachycardia Tele Trop I q6h x3 Check cardiac echo  Hypertension Cont Lisinopril 20mg  po qday  Hyperlipidemia Cont Lipitor 40mg  po qhs  ? Dementia Cont Aricept 5mg  po qday   DVT Prophylaxis   Lovenox - SCDs    AM Labs Ordered, also please review Full Orders  Family Communication: Admission, patients condition and plan of care including tests being ordered have been discussed with the patient  who indicate understanding and agree with the plan and Code Status.  Code Status  FULL CODE  Likely DC to  home  Condition GUARDED   Consults called: none  Admission status:  observation   Time spent in minutes : 70   Pearson Grippe M.D on 11/25/2017 at 11:19 PM  Between 7am to 7pm - Pager - [phone]  . After 7pm go to [email or web address] - password [facility]  [facility] - Office  [phone]

## 2017-11-26 ENCOUNTER — Other Ambulatory Visit: Payer: Self-pay

## 2017-11-26 ENCOUNTER — Observation Stay (HOSPITAL_COMMUNITY): Payer: Medicare Other

## 2017-11-26 ENCOUNTER — Observation Stay (HOSPITAL_BASED_OUTPATIENT_CLINIC_OR_DEPARTMENT_OTHER): Payer: Medicare Other

## 2017-11-26 DIAGNOSIS — I10 Essential (primary) hypertension: Secondary | ICD-10-CM

## 2017-11-26 DIAGNOSIS — I5030 Unspecified diastolic (congestive) heart failure: Secondary | ICD-10-CM

## 2017-11-26 DIAGNOSIS — J9621 Acute and chronic respiratory failure with hypoxia: Secondary | ICD-10-CM | POA: Diagnosis not present

## 2017-11-26 DIAGNOSIS — R7989 Other specified abnormal findings of blood chemistry: Secondary | ICD-10-CM | POA: Diagnosis not present

## 2017-11-26 DIAGNOSIS — J441 Chronic obstructive pulmonary disease with (acute) exacerbation: Secondary | ICD-10-CM | POA: Diagnosis not present

## 2017-11-26 HISTORY — DX: Acute and chronic respiratory failure with hypoxia: J96.21

## 2017-11-26 LAB — COMPREHENSIVE METABOLIC PANEL
ALK PHOS: 72 U/L (ref 38–126)
ALT: 21 U/L (ref 0–44)
AST: 33 U/L (ref 15–41)
Albumin: 3.6 g/dL (ref 3.5–5.0)
Anion gap: 15 (ref 5–15)
BUN: 14 mg/dL (ref 8–23)
CO2: 18 mmol/L — ABNORMAL LOW (ref 22–32)
Calcium: 8.5 mg/dL — ABNORMAL LOW (ref 8.9–10.3)
Chloride: 105 mmol/L (ref 98–111)
Creatinine, Ser: 1.05 mg/dL — ABNORMAL HIGH (ref 0.44–1.00)
GFR calc Af Amer: >60 mL/min (ref >60)
GFR, EST NON AFRICAN AMERICAN: 54 mL/min — AB (ref >60)
Glucose, Bld: 232 mg/dL — ABNORMAL HIGH (ref 70–99)
POTASSIUM: 3.1 mmol/L — AB (ref 3.5–5.1)
Sodium: 138 mmol/L (ref 135–145)
TOTAL PROTEIN: 7.4 g/dL (ref 6.5–8.1)
Total Bilirubin: 0.5 mg/dL (ref 0.3–1.2)

## 2017-11-26 LAB — GLUCOSE, CAPILLARY
GLUCOSE-CAPILLARY: 154 mg/dL — AB (ref 70–99)
GLUCOSE-CAPILLARY: 183 mg/dL — AB (ref 70–99)

## 2017-11-26 LAB — CBC
HCT: 41.1 % (ref 36.0–46.0)
HEMOGLOBIN: 13 g/dL (ref 12.0–15.0)
MCH: 28.6 pg (ref 26.0–34.0)
MCHC: 31.6 g/dL (ref 30.0–36.0)
MCV: 90.5 fL (ref 80.0–100.0)
Platelets: 297 10*3/uL (ref 150–400)
RBC: 4.54 MIL/uL (ref 3.87–5.11)
RDW: 14.6 % (ref 11.5–15.5)
WBC: 12.7 10*3/uL — AB (ref 4.0–10.5)
nRBC: 0 % (ref 0.0–0.2)

## 2017-11-26 LAB — ECHOCARDIOGRAM COMPLETE
Height: 61 in
WEIGHTICAEL: 3408 [oz_av]

## 2017-11-26 LAB — TROPONIN I
Troponin I: <0.03 ng/mL (ref <0.03)
Troponin I: <0.03 ng/mL (ref <0.03)

## 2017-11-26 LAB — D-DIMER, QUANTITATIVE (NOT AT ARMC): D DIMER QUANT: 1.42 ug{FEU}/mL — AB (ref 0.00–0.50)

## 2017-11-26 MED ORDER — POTASSIUM CHLORIDE CRYS ER 20 MEQ PO TBCR
40.0000 meq | EXTENDED_RELEASE_TABLET | Freq: Once | ORAL | Status: AC
  Administered 2017-11-26: 40 meq via ORAL
  Filled 2017-11-26: qty 2

## 2017-11-26 MED ORDER — BUDESONIDE 0.5 MG/2ML IN SUSP
0.5000 mg | Freq: Two times a day (BID) | RESPIRATORY_TRACT | Status: DC
  Administered 2017-11-26 – 2017-11-28 (×5): 0.5 mg via RESPIRATORY_TRACT
  Filled 2017-11-26 (×5): qty 2

## 2017-11-26 MED ORDER — ONDANSETRON HCL 4 MG/2ML IJ SOLN: 4.0000 mg | Freq: Four times a day (QID) | INTRAMUSCULAR | Status: DC | PRN

## 2017-11-26 MED ORDER — AMLODIPINE BESYLATE 5 MG PO TABS: 5.0000 mg | ORAL_TABLET | Freq: Every day | ORAL | Status: DC

## 2017-11-26 MED ORDER — IOPAMIDOL (ISOVUE-370) INJECTION 76%
100.0000 mL | Freq: Once | INTRAVENOUS | Status: AC | PRN
  Administered 2017-11-26: 100 mL via INTRAVENOUS

## 2017-11-26 MED ORDER — METHYLPREDNISOLONE SODIUM SUCC 125 MG IJ SOLR
80.0000 mg | Freq: Three times a day (TID) | INTRAMUSCULAR | Status: DC
  Administered 2017-11-26 – 2017-11-27 (×5): 80 mg via INTRAVENOUS
  Filled 2017-11-26 (×5): qty 2

## 2017-11-26 MED ORDER — IPRATROPIUM BROMIDE 0.02 % IN SOLN
RESPIRATORY_TRACT | Status: AC
  Administered 2017-11-26: 0.5 mg
  Filled 2017-11-26: qty 2.5

## 2017-11-26 MED ORDER — INSULIN ASPART 100 UNIT/ML ~~LOC~~ SOLN
0.0000 [IU] | Freq: Three times a day (TID) | SUBCUTANEOUS | Status: DC
  Administered 2017-11-26 – 2017-11-27 (×4): 2 [IU] via SUBCUTANEOUS

## 2017-11-26 MED ORDER — ATORVASTATIN CALCIUM 40 MG PO TABS: 40.0000 mg | ORAL_TABLET | Freq: Every day | ORAL | Status: DC

## 2017-11-26 MED ORDER — IPRATROPIUM-ALBUTEROL 0.5-2.5 (3) MG/3ML IN SOLN
3.0000 mL | Freq: Four times a day (QID) | RESPIRATORY_TRACT | Status: DC
  Administered 2017-11-26 – 2017-11-28 (×10): 3 mL via RESPIRATORY_TRACT
  Filled 2017-11-26 (×11): qty 3

## 2017-11-26 MED ORDER — LISINOPRIL 10 MG PO TABS
20.0000 mg | ORAL_TABLET | Freq: Every day | ORAL | Status: DC
  Administered 2017-11-26 – 2017-11-28 (×3): 20 mg via ORAL
  Filled 2017-11-26 (×3): qty 2

## 2017-11-26 MED ORDER — DONEPEZIL HCL 5 MG PO TABS
5.0000 mg | ORAL_TABLET | Freq: Every day | ORAL | Status: DC
  Administered 2017-11-26 – 2017-11-27 (×2): 5 mg via ORAL
  Filled 2017-11-26 (×2): qty 1

## 2017-11-26 MED ORDER — HYDRALAZINE HCL 20 MG/ML IJ SOLN: 10.0000 mg | Freq: Four times a day (QID) | INTRAMUSCULAR | Status: DC | PRN

## 2017-11-26 MED ORDER — ALBUTEROL SULFATE (2.5 MG/3ML) 0.083% IN NEBU: 2.5000 mg | INHALATION_SOLUTION | RESPIRATORY_TRACT | Status: DC | PRN

## 2017-11-26 MED ORDER — INSULIN ASPART 100 UNIT/ML ~~LOC~~ SOLN: 0.0000 [IU] | Freq: Every day | SUBCUTANEOUS | Status: DC

## 2017-11-26 MED ORDER — ATORVASTATIN CALCIUM 40 MG PO TABS
40.0000 mg | ORAL_TABLET | Freq: Every day | ORAL | Status: DC
  Administered 2017-11-26 – 2017-11-27 (×2): 40 mg via ORAL
  Filled 2017-11-26 (×3): qty 1

## 2017-11-26 NOTE — Progress Notes (Signed)
PROGRESS NOTE  Lauren Koch:[id number] DOB: Oct 23, 1950 DOA: 11/25/2017 PCP: McLean-Scocuzza, Pasty Spillers, MD  Brief History:  67 year old female with a history of cryptogenic stroke, COPD, chronic respiratory failure on 2 L at nighttime, hypertension, tobacco abuse, diastolic CHF, hyperlipidemia presenting with 1 day history of shortness of breath and increasing nonproductive cough.  Patient denies fevers, chills, headache, chest pain, nausea, vomiting, diarrhea, abdominal pain, dysuria, hematuria, hematochezia, and melena.  She denies hemoptysis or recent sick contacts. The patient continues to smoke 2 cigarettes per day with 40 pack year history.  She denies any worsening lower extremity edema, orthopnea.  In the emergency department, the patient was noted to have WBC 12.3 with potassium 3.1.  The patient was started on IV Solu-Medrol.  He was admitted for further evaluation and treatment.  Assessment/Plan: Acute on chronic respiratory failure with hypoxia -Secondary COPD exacerbation -Wean oxygen back to baseline -Presently stable on 2 L -Personally reviewed chest x-ray--no consolidation, no pulmonary edema  COPD exacerbation -Start Pulmicort -Continue duo nebs -Continue IV Solu-Medrol -viral respiratory panel  Elevated d-dimer -In the setting of tachycardia and respiratory failure, obtain CT angiogram chest  Hyperlipidemia -continue statin  Hypokalemia -replete -check mag  Tobacco abuse -I have discussed tobacco cessation with the patient.  I have counseled the patient regarding the negative impacts of continued tobacco use including but not limited to lung cancer, COPD, and cardiovascular disease.  I have discussed alternatives to tobacco and modalities that may help facilitate tobacco cessation including but not limited to biofeedback, hypnosis, and medications.  Total time spent with tobacco counseling was 4 minutes.  Impaired glucose tolerance -Anticipate  elevated CBG secondary to steroids -Start NovoLog sliding scale -check A1c  Essential Hypertension -holding lisinopril -hydralazine prn SBP >180     Disposition Plan:   Home in 2-3 days  Family Communication:   Family at bedside  Consultants:  none  Code Status:  FULL   DVT Prophylaxis:   [place] Lovenox   Procedures: As Listed in Progress Note Above  Antibiotics: None    Subjective: The patient continues to have shortness of breath with minimal exertion.  She continues to have a nonproductive cough.  She denies any hemoptysis.  She denies any nausea, vomiting, diarrhea, abdominal pain, dysuria, hematuria, headache, neck pain.  Objective: Vitals:   11/26/17 0021 11/26/17 0125 11/26/17 0636 11/26/17 0728  BP: (!) 164/81  (!) 151/82   Pulse: (!) 102  78   Resp:   18   Temp: 98.2 F (36.8 C)  (!) 97.5 F (36.4 C)   TempSrc: Oral  Oral   SpO2: 96% 96% 95% 95%  Weight: 96.6 kg     Height: 5\' 1"  (1.549 m)       Intake/Output Summary (Last 24 hours) at 11/26/2017 0953 Last data filed at 11/26/2017 0900 Gross per 24 hour  Intake 240 ml  Output -  Net 240 ml   Weight change:  Exam:   General:  Pt is alert, follows commands appropriately, not in acute distress  HEENT: No icterus, No thrush, No neck mass, [place]/AT  Cardiovascular: RRR, S1/S2, no rubs, no gallops  Respiratory: Bibasilar rales.  Bilateral expiratory wheeze.  Good air movement.  Abdomen: Soft/+BS, non tender, non distended, no guarding  Extremities: 1 + LE edema, No lymphangitis, No petechiae, No rashes, no synovitis   Data Reviewed: I have personally reviewed following labs and imaging studies Basic Metabolic Panel: Recent Labs  Lab 11/25/17 2017 11/26/17 0315  NA 138 138  K 3.9 3.1*  CL 106 105  CO2 24 18*  GLUCOSE 111* 232*  BUN 13 14  CREATININE 0.81 1.05*  CALCIUM 8.7* 8.5*   Liver Function Tests: Recent Labs  Lab 11/25/17 2017 11/26/17 0315  AST 21 33  ALT 16 21  ALKPHOS  76 72  BILITOT 0.6 0.5  PROT 7.1 7.4  ALBUMIN 3.7 3.6   No results for input(s): LIPASE, AMYLASE in the last 168 hours. No results for input(s): AMMONIA in the last 168 hours. Coagulation Profile: No results for input(s): INR, PROTIME in the last 168 hours. CBC: Recent Labs  Lab 11/25/17 2017 11/26/17 0315  WBC 12.3* 12.7*  NEUTROABS 10.1*  --   HGB 13.3 13.0  HCT 40.7 41.1  MCV 86.4 90.5  PLT 296 297   Cardiac Enzymes: Recent Labs  Lab 11/25/17 2017 11/26/17 0315 11/26/17 0752  TROPONINI <0.03 <0.03 <0.03   BNP: Invalid input(s): POCBNP CBG: No results for input(s): GLUCAP in the last 168 hours. HbA1C: No results for input(s): HGBA1C in the last 72 hours. Urine analysis:    Component Value Date/Time   COLORURINE YELLOW (A) 06/23/2017 2357   APPEARANCEUR CLEAR (A) 06/23/2017 2357   APPEARANCEUR Turbid (A) 06/10/2017 1005   LABSPEC 1.019 06/23/2017 2357   PHURINE 5.0 06/23/2017 2357   GLUCOSEU NEGATIVE 06/23/2017 2357   HGBUR NEGATIVE 06/23/2017 2357   BILIRUBINUR NEGATIVE 06/23/2017 2357   BILIRUBINUR Negative 06/10/2017 1005   KETONESUR NEGATIVE 06/23/2017 2357   PROTEINUR NEGATIVE 06/23/2017 2357   NITRITE NEGATIVE 06/23/2017 2357   LEUKOCYTESUR NEGATIVE 06/23/2017 2357   LEUKOCYTESUR Negative 06/10/2017 1005   Sepsis Labs: @LABRCNTIP (procalcitonin:4,lacticidven:4) )No results found for this or any previous visit (from the past 240 hour(s)).   Scheduled Meds: . enoxaparin (LOVENOX) injection  40 mg Subcutaneous Q24H  . fluticasone furoate-vilanterol  1 puff Inhalation Daily  . ipratropium-albuterol  3 mL Nebulization Q6H  . methylPREDNISolone (SOLU-MEDROL) injection  80 mg Intravenous Q8H  . sodium chloride flush  3 mL Intravenous Q12H  . tiotropium  18 mcg Inhalation Daily   Continuous Infusions: . sodium chloride 250 mL (11/25/17 2320)  . sodium chloride    . albuterol Stopped (11/25/17 2239)  . azithromycin 500 mg (11/26/17 0126)     Procedures/Studies: Dg Chest Port 1 View  Result Date: 11/25/2017 CLINICAL DATA:  Shortness of breath EXAM: PORTABLE CHEST 1 VIEW COMPARISON:  06/23/2017, CT 04/12/2017 FINDINGS: Mild cardiomegaly with right-sided aortic arch. No focal opacity or pleural effusion. No pneumothorax. IMPRESSION: No active disease.  Cardiomegaly with right-sided aortic arch Electronically Signed   By: Jasmine Pang M.D.   On: 11/25/2017 20:14    Catarina Hartshorn, DO  [facility] Pager [phone]  If 7PM-7AM, please contact night-coverage [email or web address] Password [email or web address] 11/26/2017, 9:53 AM   LOS: 0 days

## 2017-11-26 NOTE — Progress Notes (Signed)
*  PRELIMINARY RESULTS* Echocardiogram 2D Echocardiogram has been performed.  Stacey Drain 11/26/2017, 2:41 PM

## 2017-11-27 DIAGNOSIS — J441 Chronic obstructive pulmonary disease with (acute) exacerbation: Secondary | ICD-10-CM | POA: Diagnosis not present

## 2017-11-27 DIAGNOSIS — J9621 Acute and chronic respiratory failure with hypoxia: Secondary | ICD-10-CM | POA: Diagnosis not present

## 2017-11-27 DIAGNOSIS — I10 Essential (primary) hypertension: Secondary | ICD-10-CM | POA: Diagnosis not present

## 2017-11-27 LAB — BASIC METABOLIC PANEL
ANION GAP: 7 (ref 5–15)
BUN: 19 mg/dL (ref 8–23)
CALCIUM: 8.6 mg/dL — AB (ref 8.9–10.3)
CO2: 24 mmol/L (ref 22–32)
Chloride: 108 mmol/L (ref 98–111)
Creatinine, Ser: 0.83 mg/dL (ref 0.44–1.00)
GLUCOSE: 156 mg/dL — AB (ref 70–99)
Potassium: 4.2 mmol/L (ref 3.5–5.1)
Sodium: 139 mmol/L (ref 135–145)

## 2017-11-27 LAB — GLUCOSE, CAPILLARY
GLUCOSE-CAPILLARY: 151 mg/dL — AB (ref 70–99)
Glucose-Capillary: 199 mg/dL — ABNORMAL HIGH (ref 70–99)
Glucose-Capillary: 168 mg/dL — ABNORMAL HIGH (ref 70–99)
Glucose-Capillary: 161 mg/dL — ABNORMAL HIGH (ref 70–99)

## 2017-11-27 LAB — RESPIRATORY PANEL BY PCR
Adenovirus: NOT DETECTED
Bordetella pertussis: NOT DETECTED
CHLAMYDOPHILA PNEUMONIAE-RVPPCR: NOT DETECTED
Coronavirus 229E: NOT DETECTED
Coronavirus HKU1: NOT DETECTED
Coronavirus NL63: NOT DETECTED
Coronavirus OC43: NOT DETECTED
INFLUENZA A-RVPPCR: NOT DETECTED
INFLUENZA B-RVPPCR: NOT DETECTED
Metapneumovirus: NOT DETECTED
Mycoplasma pneumoniae: NOT DETECTED
Parainfluenza Virus 1: NOT DETECTED
Parainfluenza Virus 2: NOT DETECTED
Parainfluenza Virus 3: NOT DETECTED
Parainfluenza Virus 4: NOT DETECTED
RESPIRATORY SYNCYTIAL VIRUS-RVPPCR: NOT DETECTED
Rhinovirus / Enterovirus: NOT DETECTED

## 2017-11-27 LAB — HEMOGLOBIN A1C
HEMOGLOBIN A1C: 5.6 % (ref 4.8–5.6)
Mean Plasma Glucose: 114.02 mg/dL

## 2017-11-27 LAB — MAGNESIUM: Magnesium: 2.2 mg/dL (ref 1.7–2.4)

## 2017-11-27 MED ORDER — ALUM & MAG HYDROXIDE-SIMETH 200-200-20 MG/5ML PO SUSP
30.0000 mL | ORAL | Status: DC | PRN
  Administered 2017-11-27: 30 mL via ORAL
  Filled 2017-11-27: qty 30

## 2017-11-27 MED ORDER — DEXAMETHASONE 4 MG PO TABS
8.0000 mg | ORAL_TABLET | Freq: Two times a day (BID) | ORAL | Status: DC
  Administered 2017-11-28: 8 mg via ORAL
  Filled 2017-11-27: qty 2

## 2017-11-27 MED ORDER — METHYLPREDNISOLONE SODIUM SUCC 125 MG IJ SOLR
80.0000 mg | Freq: Three times a day (TID) | INTRAMUSCULAR | Status: AC
  Administered 2017-11-27 – 2017-11-28 (×2): 80 mg via INTRAVENOUS
  Filled 2017-11-27 (×2): qty 2

## 2017-11-27 NOTE — Progress Notes (Signed)
PROGRESS NOTE  Lauren Koch:[id number] DOB: [birth year] DOA: 11/25/2017 PCP: McLean-Scocuzza, Pasty Spillers, MD   Brief History:  67 year old female with a history of cryptogenic stroke, COPD, chronic respiratory failure on 2 L at nighttime, hypertension, tobacco abuse, diastolic CHF, hyperlipidemia presenting with 1 day history of shortness of breath and increasing nonproductive cough.  Patient denies fevers, chills, headache, chest pain, nausea, vomiting, diarrhea, abdominal pain, dysuria, hematuria, hematochezia, and melena.  She denies hemoptysis or recent sick contacts. The patient continues to smoke 2 cigarettes per day with 40 pack year history.  She denies any worsening lower extremity edema, orthopnea.  In the emergency department, the patient was noted to have WBC 12.3 with potassium 3.1.  The patient was started on IV Solu-Medrol.  He was admitted for further evaluation and treatment.  Assessment/Plan: Acute on chronic respiratory failure with hypoxia -Secondary COPD exacerbation -pt normally on 2L 24/7, but pt only uses at night -Wean oxygen back to baseline -Presently stable on 2 L -Personally reviewed chest x-ray--no consolidation, no pulmonary edema -10/26--CTA chest--neg PE, prominent interstitial markings with mild progression  COPD exacerbation -Continue Pulmicort -Continue duo nebs -Continue IV Solu-Medrol>>>dexamethasone 10/28 -viral respiratory panel--neg  Elevated d-dimer -In the setting of tachycardia and respiratory failure, obtain CT angiogram chest -10/26--CTA chest--neg PE, prominent interstitial markings with mild progression  Hyperlipidemia -continue statin  Hypokalemia -repleted -check mag--2.2  Tobacco abuse -I have discussed tobacco cessation with the patient.  I have counseled the patient regarding the negative impacts of continued tobacco use including but not limited to lung cancer, COPD, and cardiovascular disease.  I have  discussed alternatives to tobacco and modalities that may help facilitate tobacco cessation including but not limited to biofeedback, hypnosis, and medications.  Total time spent with tobacco counseling was 4 minutes.  Impaired glucose tolerance -Anticipate elevated CBG secondary to steroids -Start NovoLog sliding scale -check A1c--5.6  Essential Hypertension -holding lisinopril -hydralazine prn SBP >180     Disposition Plan:   Home in 2-3 days  Family Communication:   Family at bedside  Consultants:  none  Code Status:  FULL   DVT Prophylaxis:    Lovenox   Procedures: As Listed in Progress Note Above  Antibiotics: None  Subjective: Pt states she is breathing better.  Still has nonproductive cough.  Still has some dyspnea on exertion.  Denies cp, n/v/d, abd pain f/c  Objective: Vitals:   11/27/17 0616 11/27/17 0756 11/27/17 1451 11/27/17 1547  BP: 126/67   124/68  Pulse: 60   70  Resp: 18   16  Temp: 98 F (36.7 C)   98.6 F (37 C)  TempSrc: Oral   Oral  SpO2: 98% 98% 98% 96%  Weight:      Height:        Intake/Output Summary (Last 24 hours) at 11/27/2017 1645 Last data filed at 11/27/2017 1237 Gross per 24 hour  Intake 720 ml  Output -  Net 720 ml   Weight change:  Exam:   General:  Pt is alert, follows commands appropriately, not in acute distress  HEENT: No icterus, No thrush, No neck mass, [place]/AT  Cardiovascular: RRR, S1/S2, no rubs, no gallops  Respiratory: bibasilar rales, fine bibasilar wheeze  Abdomen: Soft/+BS, non tender, non distended, no guarding  Extremities: trace LE edema, No lymphangitis, No petechiae, No rashes, no synovitis   Data Reviewed: I have personally reviewed following labs and imaging studies Basic Metabolic Panel:  Recent Labs  Lab 11/25/17 2017 11/26/17 0315 11/27/17 0635  NA 138 138 139  K 3.9 3.1* 4.2  CL 106 105 108  CO2 24 18* 24  GLUCOSE 111* 232* 156*  BUN 13 14 19   CREATININE 0.81  1.05* 0.83  CALCIUM 8.7* 8.5* 8.6*  MG  --   --  2.2   Liver Function Tests: Recent Labs  Lab 11/25/17 2017 11/26/17 0315  AST 21 33  ALT 16 21  ALKPHOS 76 72  BILITOT 0.6 0.5  PROT 7.1 7.4  ALBUMIN 3.7 3.6   No results for input(s): LIPASE, AMYLASE in the last 168 hours. No results for input(s): AMMONIA in the last 168 hours. Coagulation Profile: No results for input(s): INR, PROTIME in the last 168 hours. CBC: Recent Labs  Lab 11/25/17 2017 11/26/17 0315  WBC 12.3* 12.7*  NEUTROABS 10.1*  --   HGB 13.3 13.0  HCT 40.7 41.1  MCV 86.4 90.5  PLT 296 297   Cardiac Enzymes: Recent Labs  Lab 11/25/17 2017 11/26/17 0315 11/26/17 0752 11/26/17 1437  TROPONINI <0.03 <0.03 <0.03 <0.03   BNP: Invalid input(s): POCBNP CBG: Recent Labs  Lab 11/26/17 1345 11/26/17 1642 11/27/17 0843 11/27/17 1124 11/27/17 1621  GLUCAP 154* 183* 199* 168* 161*   HbA1C: Recent Labs    11/27/17 0635  HGBA1C 5.6   Urine analysis:    Component Value Date/Time   COLORURINE YELLOW (A) 06/23/2017 2357   APPEARANCEUR CLEAR (A) 06/23/2017 2357   APPEARANCEUR Turbid (A) 06/10/2017 1005   LABSPEC 1.019 06/23/2017 2357   PHURINE 5.0 06/23/2017 2357   GLUCOSEU NEGATIVE 06/23/2017 2357   HGBUR NEGATIVE 06/23/2017 2357   BILIRUBINUR NEGATIVE 06/23/2017 2357   BILIRUBINUR Negative 06/10/2017 1005   KETONESUR NEGATIVE 06/23/2017 2357   PROTEINUR NEGATIVE 06/23/2017 2357   NITRITE NEGATIVE 06/23/2017 2357   LEUKOCYTESUR NEGATIVE 06/23/2017 2357   LEUKOCYTESUR Negative 06/10/2017 1005   Sepsis Labs: @LABRCNTIP (procalcitonin:4,lacticidven:4) ) Recent Results (from the past 240 hour(s))  Respiratory Panel by PCR     Status: None   Collection Time: 11/26/17 10:07 AM  Result Value Ref Range Status   Adenovirus NOT DETECTED NOT DETECTED Final   Coronavirus 229E NOT DETECTED NOT DETECTED Final   Coronavirus HKU1 NOT DETECTED NOT DETECTED Final   Coronavirus NL63 NOT DETECTED NOT  DETECTED Final   Coronavirus OC43 NOT DETECTED NOT DETECTED Final   Metapneumovirus NOT DETECTED NOT DETECTED Final   Rhinovirus / Enterovirus NOT DETECTED NOT DETECTED Final   Influenza A NOT DETECTED NOT DETECTED Final   Influenza B NOT DETECTED NOT DETECTED Final   Parainfluenza Virus 1 NOT DETECTED NOT DETECTED Final   Parainfluenza Virus 2 NOT DETECTED NOT DETECTED Final   Parainfluenza Virus 3 NOT DETECTED NOT DETECTED Final   Parainfluenza Virus 4 NOT DETECTED NOT DETECTED Final   Respiratory Syncytial Virus NOT DETECTED NOT DETECTED Final   Bordetella pertussis NOT DETECTED NOT DETECTED Final   Chlamydophila pneumoniae NOT DETECTED NOT DETECTED Final   Mycoplasma pneumoniae NOT DETECTED NOT DETECTED Final    Comment: Performed at [facility], [address], [place], [place] [address]     Scheduled Meds: . atorvastatin  40 mg Oral q1800  . budesonide (PULMICORT) nebulizer solution  0.5 mg Nebulization BID  . [START ON 11/28/2017] dexamethasone  8 mg Oral Q12H  . donepezil  5 mg Oral QHS  . enoxaparin (LOVENOX) injection  40 mg Subcutaneous Q24H  . insulin aspart  0-5 Units  Subcutaneous QHS  . insulin aspart  0-9 Units Subcutaneous TID WC  . ipratropium-albuterol  3 mL Nebulization Q6H  . lisinopril  20 mg Oral Daily  . methylPREDNISolone (SOLU-MEDROL) injection  80 mg Intravenous Q8H  . sodium chloride flush  3 mL Intravenous Q12H   Continuous Infusions: . sodium chloride Stopped (11/26/17 1232)  . sodium chloride    . albuterol Stopped (11/25/17 2239)    Procedures/Studies: Ct Angio Chest Pe W Or Wo Contrast  Result Date: 11/26/2017 CLINICAL DATA:  Dyspnea. Elevated D-dimer. EXAM: CT ANGIOGRAPHY CHEST WITH CONTRAST TECHNIQUE: Multidetector CT imaging of the chest was performed using the standard protocol during bolus administration of intravenous contrast. Multiplanar CT image reconstructions and MIPs were obtained to evaluate the vascular anatomy. CONTRAST:   ISOVUE-370 IOPAMIDOL (ISOVUE-370) INJECTION 76% COMPARISON:  Portable chest obtained yesterday. Chest CTA dated 04/12/2017. FINDINGS: Cardiovascular: Previously noted right aortic arch. 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 The heart remains mildly enlarged. Normally opacified pulmonary arteries with no pulmonary arterial filling defects seen. Mediastinum/Nodes: No enlarged mediastinal, hilar, or axillary lymph nodes. Thyroid gland, trachea, and esophagus demonstrate no significant findings. Previously noted small hiatal hernia. Lungs/Pleura: Interval atelectasis in the inferior aspect of the right upper lobe and mild dependent atelectasis in the left lower lobe. Mild patchy prominence of the interstitial markings with mild progression. This remains most pronounced in the left lower lobe. No pleural fluid. Upper Abdomen: Unremarkable. Musculoskeletal: Thoracic spine degenerative changes. Review of the MIP images confirms the above findings. IMPRESSION: 1. No pulmonary emboli. 2. Interval atelectasis in the inferior aspect of the right upper lobe and mild dependent atelectasis in the left lower lobe. 3. Mild patchy prominence of the interstitial markings with mild progression, most pronounced in the left lower lobe. This could represent minimal interstitial pulmonary edema or minimal interstitial pneumonitis. 4. Stable right aortic arch. 5. 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 6. Small hiatal hernia. Electronically Signed   By: Beckie Salts M.D.   On: 11/26/2017 13:44   Dg Chest Port 1 View  Result Date: 11/25/2017 CLINICAL DATA:  Shortness of breath EXAM: PORTABLE CHEST 1 VIEW COMPARISON:  06/23/2017, CT 04/12/2017 FINDINGS: Mild cardiomegaly with right-sided aortic arch. No focal opacity or pleural effusion. No pneumothorax. IMPRESSION: No active disease.  Cardiomegaly with right-sided aortic arch  Electronically Signed   By: Jasmine Pang M.D.   On: 11/25/2017 20:14    Catarina Hartshorn, DO  [facility] Pager [phone]  If 7PM-7AM, please contact night-coverage [email or web address] Password [email or web address] 11/27/2017, 4:45 PM   LOS: 0 days

## 2017-11-28 DIAGNOSIS — R7302 Impaired glucose tolerance (oral): Secondary | ICD-10-CM | POA: Diagnosis present

## 2017-11-28 DIAGNOSIS — Z833 Family history of diabetes mellitus: Secondary | ICD-10-CM | POA: Diagnosis not present

## 2017-11-28 DIAGNOSIS — T380X5A Adverse effect of glucocorticoids and synthetic analogues, initial encounter: Secondary | ICD-10-CM | POA: Diagnosis present

## 2017-11-28 DIAGNOSIS — Z9981 Dependence on supplemental oxygen: Secondary | ICD-10-CM | POA: Diagnosis not present

## 2017-11-28 DIAGNOSIS — Z8673 Personal history of transient ischemic attack (TIA), and cerebral infarction without residual deficits: Secondary | ICD-10-CM | POA: Diagnosis not present

## 2017-11-28 DIAGNOSIS — Z8349 Family history of other endocrine, nutritional and metabolic diseases: Secondary | ICD-10-CM | POA: Diagnosis not present

## 2017-11-28 DIAGNOSIS — J441 Chronic obstructive pulmonary disease with (acute) exacerbation: Secondary | ICD-10-CM | POA: Diagnosis present

## 2017-11-28 DIAGNOSIS — F0390 Unspecified dementia without behavioral disturbance: Secondary | ICD-10-CM | POA: Diagnosis present

## 2017-11-28 DIAGNOSIS — R0602 Shortness of breath: Secondary | ICD-10-CM | POA: Diagnosis not present

## 2017-11-28 DIAGNOSIS — E876 Hypokalemia: Secondary | ICD-10-CM | POA: Diagnosis present

## 2017-11-28 DIAGNOSIS — Z90710 Acquired absence of both cervix and uterus: Secondary | ICD-10-CM | POA: Diagnosis not present

## 2017-11-28 DIAGNOSIS — I5032 Chronic diastolic (congestive) heart failure: Secondary | ICD-10-CM | POA: Diagnosis present

## 2017-11-28 DIAGNOSIS — Z823 Family history of stroke: Secondary | ICD-10-CM | POA: Diagnosis not present

## 2017-11-28 DIAGNOSIS — J9621 Acute and chronic respiratory failure with hypoxia: Secondary | ICD-10-CM | POA: Diagnosis present

## 2017-11-28 DIAGNOSIS — I10 Essential (primary) hypertension: Secondary | ICD-10-CM | POA: Diagnosis not present

## 2017-11-28 DIAGNOSIS — E785 Hyperlipidemia, unspecified: Secondary | ICD-10-CM | POA: Diagnosis present

## 2017-11-28 DIAGNOSIS — Z888 Allergy status to other drugs, medicaments and biological substances status: Secondary | ICD-10-CM | POA: Diagnosis not present

## 2017-11-28 DIAGNOSIS — F17210 Nicotine dependence, cigarettes, uncomplicated: Secondary | ICD-10-CM | POA: Diagnosis present

## 2017-11-28 DIAGNOSIS — Z8249 Family history of ischemic heart disease and other diseases of the circulatory system: Secondary | ICD-10-CM | POA: Diagnosis not present

## 2017-11-28 DIAGNOSIS — I110 Hypertensive heart disease with heart failure: Secondary | ICD-10-CM | POA: Diagnosis present

## 2017-11-28 DIAGNOSIS — Z7951 Long term (current) use of inhaled steroids: Secondary | ICD-10-CM | POA: Diagnosis not present

## 2017-11-28 DIAGNOSIS — Z79899 Other long term (current) drug therapy: Secondary | ICD-10-CM | POA: Diagnosis not present

## 2017-11-28 DIAGNOSIS — Z808 Family history of malignant neoplasm of other organs or systems: Secondary | ICD-10-CM | POA: Diagnosis not present

## 2017-11-28 DIAGNOSIS — K219 Gastro-esophageal reflux disease without esophagitis: Secondary | ICD-10-CM | POA: Diagnosis present

## 2017-11-28 LAB — GLUCOSE, CAPILLARY
GLUCOSE-CAPILLARY: 186 mg/dL — AB (ref 70–99)
Glucose-Capillary: 153 mg/dL — ABNORMAL HIGH (ref 70–99)

## 2017-11-28 MED ORDER — DEXAMETHASONE 4 MG PO TABS: 8.0000 mg | ORAL_TABLET | Freq: Two times a day (BID) | ORAL | 0 refills | Status: DC

## 2017-11-28 NOTE — Progress Notes (Signed)
Lauren Koch discharged Home per MD order.  Discharge instructions reviewed and discussed with the patient, all questions and concerns answered. Copy of instructions and scripts given to patient.  Allergies as of 11/28/2017      Reactions   Prednisone Rash, Other (See Comments)   Arrhythmia, also (Has tolerated hydrocortisone and decadron)      Medication List    TAKE these medications   albuterol 108 (90 Base) MCG/ACT inhaler Commonly known as:  PROVENTIL HFA;VENTOLIN HFA [name]   atorvastatin 40 MG tablet Commonly known as:  LIPITOR [name]   dexamethasone 4 MG tablet Commonly known as:  DECADRON Take 2 tablets (8 mg total) by mouth 2 (two) times daily. X 3 days, then 1 tablet (4 mg) two times daily x 3 days, then 1 tablet (4 mg) once daily x 3 days   donepezil 5 MG tablet Commonly known as:  ARICEPT [name]   Fluticasone-Salmeterol 250-50 MCG/DOSE Aepb Commonly known as:  ADVAIR Inhale 1 puff into the lungs 2 (two) times daily. Rinse mouth   ipratropium-albuterol 0.5-2.5 (3) MG/3ML Soln Commonly known as:  DUONEB [name]   lisinopril 20 MG tablet Commonly known as:  PRINIVIL,ZESTRIL [name]       Patients skin is clean, dry and intact, no evidence of skin break down. IV site discontinued and catheter remains intact. Site without signs and symptoms of complications. Dressing and pressure applied.  Patient escorted to car by NT in a wheelchair,  no distress noted upon discharge.  Lauren Koch Lauren Koch 11/28/2017 4:40 PM

## 2017-11-28 NOTE — Care Management (Signed)
Chart reviewed for CM needs. Pt admitted for COPD. Pt lives with family. Pt has insurance with PCP. Pt has home oxygen but does not wear it as instructed. Pt has used [facility] int he past for [facility]. No indications at this time [facility] warranted. Has had 2 unrelated admissions and 1 ED visit in 6 months. No CM needs identified. Will DC home today with self care.

## 2017-11-28 NOTE — Discharge Summary (Signed)
Physician Discharge Summary  Lauren Koch:[id number] DOB: 05-Dec-1950 DOA: 11/25/2017  PCP: McLean-Scocuzza, Pasty Spillers, MD  Admit date: 11/25/2017 Discharge date: 11/28/2017  Admitted From: Home Disposition:  Home  Recommendations for Outpatient Follow-up:  1. Follow up with PCP in 1-2 weeks 2. Please obtain BMP/CBC in one week    Discharge Condition: Stable CODE STATUS: FULL Diet recommendation: Heart Healthy / Carb Modified    Brief/Interim Summary: 67 year old female with a history of cryptogenic stroke, COPD, chronic respiratory failure on 2 L at nighttime, hypertension, tobacco abuse, diastolic CHF, hyperlipidemia presenting with 1 day history of shortness of breath and increasing nonproductive cough.Patient denies fevers, chills, headache, chest pain, nausea, vomiting, diarrhea, abdominal pain, dysuria, hematuria, hematochezia, and melena.She denies hemoptysis or recent sick contacts. The patient continues to smoke 2 cigarettes per day with 40 pack year history.She denies any worsening lower extremity edema, orthopnea. In the emergency department, the patient was noted to have WBC 12.3 with potassium 3.1. The patient was started on IV Solu-Medrol. He was admitted for further evaluation and treatment.  Discharge Diagnoses:   Acute on chronic respiratory failure with hypoxia -Secondary COPD exacerbation -pt normally on 2L 24/7, but pt only uses at night, but is suppose to use it 24/7 -Wean oxygen back to baseline -Presently stable on 2 L -Personally reviewed chest x-ray--no consolidation, no pulmonary edema -10/26--CTA chest--neg PE, prominent interstitial markings with mild progression  COPD exacerbation -Continue Pulmicort -Continue duo nebs -Continue IV Solu-Medrol>>>dexamethasone 10/28 (prednisone intolerance) -viral respiratory panel--neg  Elevated d-dimer -In the setting of tachycardia and respiratory failure, obtain CT angiogram chest -10/26--CTA  chest--neg PE, prominent interstitial markings with mild progression  Hyperlipidemia -continue statin  Hypokalemia -repleted -check mag--2.2  Tobacco abuse -I have discussed tobacco cessation with the patient. I have counseled the patient regarding the negative impacts of continued tobacco use including but not limited to lung cancer, COPD, and cardiovascular disease. I have discussed alternatives to tobacco and modalities that may help facilitate tobacco cessation including but not limited to biofeedback, hypnosis, and medications. Total time spent with tobacco counseling was 4 minutes.  Impaired glucose tolerance -Anticipate elevated CBG secondary to steroids -Start NovoLog sliding scale -check A1c--5.6  Essential Hypertension -holding lisinopril>>>restart after d/c -hydralazine prn SBP>180    Discharge Instructions   Allergies as of 11/28/2017      Reactions   Prednisone Rash, Other (See Comments)   Arrhythmia, also (Has tolerated hydrocortisone and decadron)      Medication List    TAKE these medications   albuterol 108 (90 Base) MCG/ACT inhaler Commonly known as:  PROVENTIL HFA;VENTOLIN HFA [name]   atorvastatin 40 MG tablet Commonly known as:  LIPITOR [name]   dexamethasone 4 MG tablet Commonly known as:  DECADRON Take 2 tablets (8 mg total) by mouth 2 (two) times daily. X 3 days, then 1 tablet (4 mg) two times daily x 3 days, then 1 tablet (4 mg) once daily x 3 days   donepezil 5 MG tablet Commonly known as:  ARICEPT [name]   Fluticasone-Salmeterol 250-50 MCG/DOSE Aepb Commonly known as:  ADVAIR Inhale 1 puff into the lungs 2 (two) times daily. Rinse mouth   ipratropium-albuterol 0.5-2.5 (3) MG/3ML Soln Commonly known as:  DUONEB Take 3 mLs by nebulization every 6  (  six) hours.   lisinopril 20 MG tablet Commonly known as:  PRINIVIL,ZESTRIL [name]       Allergies  Allergen Reactions  . Prednisone Rash and Other (See Comments)    Arrhythmia, also (Has tolerated hydrocortisone and decadron)     Consultations:  none   Procedures/Studies: Ct Angio Chest Pe W Or Wo Contrast  Result Date: 11/26/2017 CLINICAL DATA:  Dyspnea. Elevated D-dimer. EXAM: CT ANGIOGRAPHY CHEST WITH CONTRAST TECHNIQUE: Multidetector CT imaging of the chest was performed using the standard protocol during bolus administration of intravenous contrast. Multiplanar CT image reconstructions and MIPs were obtained to evaluate the vascular anatomy. CONTRAST:  ISOVUE-370 IOPAMIDOL (ISOVUE-370) INJECTION 76% COMPARISON:  Portable chest obtained yesterday. Chest CTA dated 04/12/2017. FINDINGS: Cardiovascular: Previously noted right aortic arch. 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 The heart remains mildly enlarged. Normally opacified pulmonary arteries with no pulmonary arterial filling defects seen. Mediastinum/Nodes: No enlarged mediastinal, hilar, or axillary lymph nodes. Thyroid gland, trachea, and esophagus demonstrate no significant findings. Previously noted small hiatal hernia. Lungs/Pleura: Interval atelectasis in the inferior aspect of the right upper lobe and mild dependent atelectasis in the left lower lobe. Mild patchy prominence of the interstitial markings with mild progression. This remains most pronounced in the left lower lobe. No pleural fluid. Upper Abdomen: Unremarkable. Musculoskeletal: Thoracic spine degenerative changes. Review of the MIP images confirms the above findings. IMPRESSION: 1. No pulmonary emboli. 2. Interval atelectasis in the inferior aspect of the right upper lobe and mild dependent atelectasis in the left lower lobe. 3. Mild patchy prominence of the interstitial  markings with mild progression, most pronounced in the left lower lobe. This could represent minimal interstitial pulmonary edema or minimal interstitial pneumonitis. 4. Stable right aortic arch. 5. 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 6. Small hiatal hernia. Electronically Signed   By: Beckie Salts M.D.   On: 11/26/2017 13:44   Dg Chest Port 1 View  Result Date: 11/25/2017 CLINICAL DATA:  Shortness of breath EXAM: PORTABLE CHEST 1 VIEW COMPARISON:  06/23/2017, CT 04/12/2017 FINDINGS: Mild cardiomegaly with right-sided aortic arch. No focal opacity or pleural effusion. No pneumothorax. IMPRESSION: No active disease.  Cardiomegaly with right-sided aortic arch Electronically Signed   By: Jasmine Pang M.D.   On: 11/25/2017 20:14         Discharge Exam: Vitals:   11/28/17 1355 11/28/17 1437  BP:  140/75  Pulse:  62  Resp:  18  Temp:  97.7 F (36.5 C)  SpO2: 97% 100%   Vitals:   11/28/17 0726 11/28/17 0731 11/28/17 1355 11/28/17 1437  BP:    140/75  Pulse:    62  Resp:    18  Temp:    97.7 F (36.5 C)  TempSrc:    Oral  SpO2: 98% 98% 97% 100%  Weight:      Height:        General: Pt is alert, awake, not in acute distress Cardiovascular: RRR, S1/S2 +, no rubs, no gallops Respiratory: bibasilar rales, no wheeze Abdominal: Soft, NT, ND, bowel sounds + Extremities: no edema, no cyanosis   The results of significant diagnostics from this hospitalization (including imaging, microbiology, ancillary and laboratory) are listed below for reference.    Significant Diagnostic Studies: Ct Angio Chest Pe W Or Wo Contrast  Result Date: 11/26/2017 CLINICAL DATA:  Dyspnea. Elevated D-dimer. EXAM: CT ANGIOGRAPHY  CHEST WITH CONTRAST TECHNIQUE: Multidetector CT imaging of the chest was performed using the standard protocol during bolus administration of intravenous contrast. Multiplanar CT image reconstructions and MIPs were obtained to evaluate the  vascular anatomy. CONTRAST:  ISOVUE-370 IOPAMIDOL (ISOVUE-370) INJECTION 76% COMPARISON:  Portable chest obtained yesterday. Chest CTA dated 04/12/2017. FINDINGS: Cardiovascular: Previously noted right aortic arch. 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 The heart remains mildly enlarged. Normally opacified pulmonary arteries with no pulmonary arterial filling defects seen. Mediastinum/Nodes: No enlarged mediastinal, hilar, or axillary lymph nodes. Thyroid gland, trachea, and esophagus demonstrate no significant findings. Previously noted small hiatal hernia. Lungs/Pleura: Interval atelectasis in the inferior aspect of the right upper lobe and mild dependent atelectasis in the left lower lobe. Mild patchy prominence of the interstitial markings with mild progression. This remains most pronounced in the left lower lobe. No pleural fluid. Upper Abdomen: Unremarkable. Musculoskeletal: Thoracic spine degenerative changes. Review of the MIP images confirms the above findings. IMPRESSION: 1. No pulmonary emboli. 2. Interval atelectasis in the inferior aspect of the right upper lobe and mild dependent atelectasis in the left lower lobe. 3. Mild patchy prominence of the interstitial markings with mild progression, most pronounced in the left lower lobe. This could represent minimal interstitial pulmonary edema or minimal interstitial pneumonitis. 4. Stable right aortic arch. 5. 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 6. Small hiatal hernia. Electronically Signed   By: Beckie Salts M.D.   On: 11/26/2017 13:44   Dg Chest Port 1 View  Result Date: 11/25/2017 CLINICAL DATA:  Shortness of breath EXAM: PORTABLE CHEST 1 VIEW COMPARISON:  06/23/2017, CT 04/12/2017 FINDINGS: Mild cardiomegaly with right-sided aortic arch. No focal opacity or pleural effusion. No pneumothorax. IMPRESSION: No active disease.  Cardiomegaly with  right-sided aortic arch Electronically Signed   By: Jasmine Pang M.D.   On: 11/25/2017 20:14     Microbiology: Recent Results (from the past 240 hour(s))  Respiratory Panel by PCR     Status: None   Collection Time: 11/26/17 10:07 AM  Result Value Ref Range Status   Adenovirus NOT DETECTED NOT DETECTED Final   Coronavirus 229E NOT DETECTED NOT DETECTED Final   Coronavirus HKU1 NOT DETECTED NOT DETECTED Final   Coronavirus NL63 NOT DETECTED NOT DETECTED Final   Coronavirus OC43 NOT DETECTED NOT DETECTED Final   Metapneumovirus NOT DETECTED NOT DETECTED Final   Rhinovirus / Enterovirus NOT DETECTED NOT DETECTED Final   Influenza A NOT DETECTED NOT DETECTED Final   Influenza B NOT DETECTED NOT DETECTED Final   Parainfluenza Virus 1 NOT DETECTED NOT DETECTED Final   Parainfluenza Virus 2 NOT DETECTED NOT DETECTED Final   Parainfluenza Virus 3 NOT DETECTED NOT DETECTED Final   Parainfluenza Virus 4 NOT DETECTED NOT DETECTED Final   Respiratory Syncytial Virus NOT DETECTED NOT DETECTED Final   Bordetella pertussis NOT DETECTED NOT DETECTED Final   Chlamydophila pneumoniae NOT DETECTED NOT DETECTED Final   Mycoplasma pneumoniae NOT DETECTED NOT DETECTED Final    Comment: Performed at [facility], [address], [place], [place] [address]     Labs: Basic Metabolic Panel: Recent Labs  Lab 11/25/17 2017 11/26/17 0315 11/27/17 0635  NA 138 138 139  K 3.9 3.1* 4.2  CL 106 105 108  CO2 24 18* 24  GLUCOSE 111* 232* 156*  BUN 13 14 19   CREATININE 0.81 1.05* 0.83  CALCIUM 8.7* 8.5* 8.6*  MG  --   --  2.2   Liver Function Tests: Recent Labs  Lab 11/25/17 2017 11/26/17 0315  AST 21 33  ALT 16 21  ALKPHOS 76 72  BILITOT 0.6 0.5  PROT 7.1 7.4  ALBUMIN 3.7 3.6   No results for input(s): LIPASE, AMYLASE in the last 168 hours. No results for input(s): AMMONIA in the last 168 hours. CBC: Recent Labs  Lab 11/25/17 2017 11/26/17 0315  WBC 12.3* 12.7*  NEUTROABS 10.1*   --   HGB 13.3 13.0  HCT 40.7 41.1  MCV 86.4 90.5  PLT 296 297   Cardiac Enzymes: Recent Labs  Lab 11/25/17 2017 11/26/17 0315 11/26/17 0752 11/26/17 1437  TROPONINI <0.03 <0.03 <0.03 <0.03   BNP: Invalid input(s): POCBNP CBG: Recent Labs  Lab 11/27/17 1124 11/27/17 1621 11/27/17 2159 11/28/17 0750 11/28/17 1112  GLUCAP 168* 161* 151* 153* 186*    Time coordinating discharge:  36 minutes  Signed:  Catarina Hartshorn, DO [facility] Pager: [phone] 11/28/2017, 2:38 PM

## 2017-11-29 ENCOUNTER — Telehealth: Payer: Self-pay

## 2017-11-29 NOTE — Telephone Encounter (Signed)
Second attempt for transitional care management.  Unable to reach. Will follow as appropriate.

## 2017-11-29 NOTE — Telephone Encounter (Signed)
First attempt regarding transitional care management. Primary care provider will need to obtain labs and have an office visit with a targeted date of Nov 5 at 10:00. Unable to reach patient.  Left a message, HIPAA compliant. Will continue to follow as appropriate.  OK for P[facility] to read message to patient and confirm targeted appointment date and time.

## 2017-11-30 NOTE — Telephone Encounter (Signed)
Third attempt to follow up with transitional care management.  Unable to reach patient.  No answer. Left call back number and message to schedule hospital follow up with labs. Called daughter Wyatt Mage, HIPAA compliant to confirm follow up appointment date and time Nov 5 at 10:00. Will continue to reach out to patient for TCM follow up.

## 2017-12-01 NOTE — Telephone Encounter (Signed)
Transition Care Management Follow-up Telephone Call   Date discharged? 11/28/17   How have you been since you were released from the hospital? Doing well. No complaints.  Appetite ok. No N/V. Bowel/bladder appropriate. Denies fever and cough. Uses oxygen 2L at night time and as needed.     Do you understand why you were in the hospital? Yes, shortness of breath and cough.    Do you understand the discharge instructions? Yes, take prednisone; tolerating ok. Complete CBC/BMP in 1 week. Follow up with PCP in 1-2 weeks.  Increase activity as tolerated.    Where were you discharged to? Home.    Items Reviewed:  Medications reviewed: Yes. Taking all scheduled medications as directed.  Daughter manages medications. Inhaler and nebulizer prn.   Allergies reviewed: Yes, prednisone; tolerating ok.   Dietary changes reviewed: Heart healthy/carb modified.  Referrals reviewed: Yes.   Functional Questionnaire:   Activities of Daily Living (ADLs):   She states they are independent in the following: Self feeds, toileting, grooming. States they require assistance with the following: Meal prep, home management. Lives with daughter. Ambulates with walker prn.   Any transportation issues/concerns?: No. Daughter to drive.    Any patient concerns? None today.    Confirmed importance and date/time of follow-up visits scheduled Yes, Nov 5 at 10:00. Need for CBC/CMP labs confirmed as well. PCP to order.   Provider Appointment booked with Dr. Judie Grieve (pcp).   Confirmed with patient if condition begins to worsen call PCP or go to the ER.  Patient was given the office number and encouraged to call back with question or concerns.  : Yes.

## 2017-12-03 ENCOUNTER — Inpatient Hospital Stay
Admission: EM | Admit: 2017-12-03 | Discharge: 2017-12-08 | DRG: 191 | Disposition: A | Discharge status: Home / Self Care | Payer: Medicare Other | Attending: Specialist | Admitting: Specialist

## 2017-12-03 ENCOUNTER — Other Ambulatory Visit: Payer: Self-pay

## 2017-12-03 ENCOUNTER — Emergency Department: Payer: Medicare Other

## 2017-12-03 DIAGNOSIS — F0390 Unspecified dementia without behavioral disturbance: Secondary | ICD-10-CM | POA: Diagnosis present

## 2017-12-03 DIAGNOSIS — K219 Gastro-esophageal reflux disease without esophagitis: Secondary | ICD-10-CM | POA: Diagnosis present

## 2017-12-03 DIAGNOSIS — T380X5A Adverse effect of glucocorticoids and synthetic analogues, initial encounter: Secondary | ICD-10-CM | POA: Diagnosis not present

## 2017-12-03 DIAGNOSIS — Z808 Family history of malignant neoplasm of other organs or systems: Secondary | ICD-10-CM | POA: Diagnosis not present

## 2017-12-03 DIAGNOSIS — Z79899 Other long term (current) drug therapy: Secondary | ICD-10-CM | POA: Diagnosis not present

## 2017-12-03 DIAGNOSIS — X58XXXA Exposure to other specified factors, initial encounter: Secondary | ICD-10-CM | POA: Diagnosis not present

## 2017-12-03 DIAGNOSIS — D72829 Elevated white blood cell count, unspecified: Secondary | ICD-10-CM | POA: Diagnosis not present

## 2017-12-03 DIAGNOSIS — Z8349 Family history of other endocrine, nutritional and metabolic diseases: Secondary | ICD-10-CM | POA: Diagnosis not present

## 2017-12-03 DIAGNOSIS — I248 Other forms of acute ischemic heart disease: Secondary | ICD-10-CM | POA: Diagnosis present

## 2017-12-03 DIAGNOSIS — I214 Non-ST elevation (NSTEMI) myocardial infarction: Secondary | ICD-10-CM | POA: Diagnosis not present

## 2017-12-03 DIAGNOSIS — Z90711 Acquired absence of uterus with remaining cervical stump: Secondary | ICD-10-CM | POA: Diagnosis not present

## 2017-12-03 DIAGNOSIS — F17210 Nicotine dependence, cigarettes, uncomplicated: Secondary | ICD-10-CM | POA: Diagnosis present

## 2017-12-03 DIAGNOSIS — Z8249 Family history of ischemic heart disease and other diseases of the circulatory system: Secondary | ICD-10-CM | POA: Diagnosis not present

## 2017-12-03 DIAGNOSIS — J439 Emphysema, unspecified: Principal | ICD-10-CM | POA: Diagnosis present

## 2017-12-03 DIAGNOSIS — Z6841 Body Mass Index (BMI) 40.0 and over, adult: Secondary | ICD-10-CM

## 2017-12-03 DIAGNOSIS — I110 Hypertensive heart disease with heart failure: Secondary | ICD-10-CM | POA: Diagnosis present

## 2017-12-03 DIAGNOSIS — Z8673 Personal history of transient ischemic attack (TIA), and cerebral infarction without residual deficits: Secondary | ICD-10-CM

## 2017-12-03 DIAGNOSIS — Z9981 Dependence on supplemental oxygen: Secondary | ICD-10-CM | POA: Diagnosis not present

## 2017-12-03 DIAGNOSIS — H269 Unspecified cataract: Secondary | ICD-10-CM | POA: Diagnosis present

## 2017-12-03 DIAGNOSIS — E785 Hyperlipidemia, unspecified: Secondary | ICD-10-CM | POA: Diagnosis present

## 2017-12-03 DIAGNOSIS — Z7951 Long term (current) use of inhaled steroids: Secondary | ICD-10-CM | POA: Diagnosis not present

## 2017-12-03 DIAGNOSIS — Z888 Allergy status to other drugs, medicaments and biological substances status: Secondary | ICD-10-CM | POA: Diagnosis not present

## 2017-12-03 DIAGNOSIS — Z833 Family history of diabetes mellitus: Secondary | ICD-10-CM

## 2017-12-03 DIAGNOSIS — J441 Chronic obstructive pulmonary disease with (acute) exacerbation: Secondary | ICD-10-CM | POA: Diagnosis not present

## 2017-12-03 DIAGNOSIS — Z823 Family history of stroke: Secondary | ICD-10-CM

## 2017-12-03 DIAGNOSIS — R55 Syncope and collapse: Secondary | ICD-10-CM | POA: Diagnosis not present

## 2017-12-03 DIAGNOSIS — I5032 Chronic diastolic (congestive) heart failure: Secondary | ICD-10-CM | POA: Diagnosis present

## 2017-12-03 DIAGNOSIS — R0602 Shortness of breath: Secondary | ICD-10-CM | POA: Diagnosis not present

## 2017-12-03 DIAGNOSIS — I10 Essential (primary) hypertension: Secondary | ICD-10-CM | POA: Diagnosis not present

## 2017-12-03 DIAGNOSIS — R7989 Other specified abnormal findings of blood chemistry: Secondary | ICD-10-CM | POA: Diagnosis not present

## 2017-12-03 LAB — TROPONIN I: Troponin I: 0.13 ng/mL (ref <0.03)

## 2017-12-03 LAB — CBC WITH DIFFERENTIAL/PLATELET
ABS IMMATURE GRANULOCYTES: 0.29 10*3/uL — AB (ref 0.00–0.07)
Basophils Absolute: 0.1 10*3/uL (ref 0.0–0.1)
Basophils Relative: 0 %
Eosinophils Absolute: 0.1 10*3/uL (ref 0.0–0.5)
Eosinophils Relative: 0 %
HEMATOCRIT: 44.8 % (ref 36.0–46.0)
Hemoglobin: 14.7 g/dL (ref 12.0–15.0)
IMMATURE GRANULOCYTES: 2 %
LYMPHS ABS: 1.6 10*3/uL (ref 0.7–4.0)
Lymphocytes Relative: 9 %
MCH: 28.8 pg (ref 26.0–34.0)
MCHC: 32.8 g/dL (ref 30.0–36.0)
MCV: 87.7 fL (ref 80.0–100.0)
MONOS PCT: 9 %
Monocytes Absolute: 1.5 10*3/uL — ABNORMAL HIGH (ref 0.1–1.0)
Neutro Abs: 13.6 10*3/uL — ABNORMAL HIGH (ref 1.7–7.7)
Neutrophils Relative %: 80 %
Platelets: 385 10*3/uL (ref 150–400)
RBC: 5.11 MIL/uL (ref 3.87–5.11)
RDW: 14.6 % (ref 11.5–15.5)
WBC: 17.1 10*3/uL — ABNORMAL HIGH (ref 4.0–10.5)
nRBC: 0 % (ref 0.0–0.2)

## 2017-12-03 LAB — BASIC METABOLIC PANEL
Anion gap: 11 (ref 5–15)
BUN: 22 mg/dL (ref 8–23)
CO2: 30 mmol/L (ref 22–32)
CREATININE: 0.94 mg/dL (ref 0.44–1.00)
Calcium: 9 mg/dL (ref 8.9–10.3)
Chloride: 100 mmol/L (ref 98–111)
GFR calc Af Amer: >60 mL/min (ref >60)
GFR calc non Af Amer: >60 mL/min (ref >60)
Glucose, Bld: 107 mg/dL — ABNORMAL HIGH (ref 70–99)
Potassium: 4.1 mmol/L (ref 3.5–5.1)
SODIUM: 141 mmol/L (ref 135–145)

## 2017-12-03 LAB — PROTIME-INR
INR: 0.96
Prothrombin Time: 12.7 seconds (ref 11.4–15.2)

## 2017-12-03 LAB — LACTIC ACID, PLASMA: Lactic Acid, Venous: 1.7 mmol/L (ref 0.5–1.9)

## 2017-12-03 LAB — APTT: aPTT: 26 seconds (ref 24–36)

## 2017-12-03 LAB — BRAIN NATRIURETIC PEPTIDE: B NATRIURETIC PEPTIDE 5: 67 pg/mL (ref 0.0–100.0)

## 2017-12-03 MED ORDER — MOMETASONE FURO-FORMOTEROL FUM 200-5 MCG/ACT IN AERO
2.0000 | INHALATION_SPRAY | Freq: Two times a day (BID) | RESPIRATORY_TRACT | Status: DC
  Administered 2017-12-04 – 2017-12-05 (×3): 2 via RESPIRATORY_TRACT
  Filled 2017-12-03: qty 8.8

## 2017-12-03 MED ORDER — POLYETHYLENE GLYCOL 3350 17 G PO PACK: 17.0000 g | PACK | Freq: Every day | ORAL | Status: DC | PRN

## 2017-12-03 MED ORDER — HEPARIN (PORCINE) IN NACL 100-0.45 UNIT/ML-% IJ SOLN
1000.0000 [IU]/h | INTRAMUSCULAR | Status: DC
  Administered 2017-12-03: 850 [IU]/h via INTRAVENOUS
  Filled 2017-12-03 (×2): qty 250

## 2017-12-03 MED ORDER — ONDANSETRON HCL 4 MG PO TABS: 4.0000 mg | ORAL_TABLET | Freq: Four times a day (QID) | ORAL | Status: DC | PRN

## 2017-12-03 MED ORDER — IPRATROPIUM-ALBUTEROL 0.5-2.5 (3) MG/3ML IN SOLN
3.0000 mL | Freq: Four times a day (QID) | RESPIRATORY_TRACT | Status: DC
  Administered 2017-12-03 – 2017-12-07 (×14): 3 mL via RESPIRATORY_TRACT
  Filled 2017-12-03 (×13): qty 3

## 2017-12-03 MED ORDER — ATORVASTATIN CALCIUM 20 MG PO TABS
40.0000 mg | ORAL_TABLET | Freq: Every day | ORAL | Status: DC
  Administered 2017-12-04 – 2017-12-07 (×4): 40 mg via ORAL
  Filled 2017-12-03 (×4): qty 2

## 2017-12-03 MED ORDER — METHYLPREDNISOLONE SODIUM SUCC 125 MG IJ SOLR
60.0000 mg | Freq: Two times a day (BID) | INTRAMUSCULAR | Status: DC
  Administered 2017-12-04 – 2017-12-05 (×3): 60 mg via INTRAVENOUS
  Filled 2017-12-03 (×3): qty 2

## 2017-12-03 MED ORDER — SODIUM CHLORIDE 0.9% FLUSH
3.0000 mL | Freq: Two times a day (BID) | INTRAVENOUS | Status: DC
  Administered 2017-12-04 – 2017-12-08 (×9): 3 mL via INTRAVENOUS

## 2017-12-03 MED ORDER — LISINOPRIL 20 MG PO TABS
40.0000 mg | ORAL_TABLET | Freq: Every day | ORAL | Status: DC
  Administered 2017-12-04 – 2017-12-08 (×5): 40 mg via ORAL
  Filled 2017-12-03 (×5): qty 2

## 2017-12-03 MED ORDER — METHYLPREDNISOLONE SODIUM SUCC 125 MG IJ SOLR
125.0000 mg | Freq: Once | INTRAMUSCULAR | Status: AC
  Administered 2017-12-03: 125 mg via INTRAVENOUS
  Filled 2017-12-03: qty 2

## 2017-12-03 MED ORDER — ACETAMINOPHEN 325 MG PO TABS: 650.0000 mg | ORAL_TABLET | Freq: Four times a day (QID) | ORAL | Status: DC | PRN

## 2017-12-03 MED ORDER — IPRATROPIUM-ALBUTEROL 0.5-2.5 (3) MG/3ML IN SOLN
3.0000 mL | Freq: Once | RESPIRATORY_TRACT | Status: AC
  Administered 2017-12-03: 3 mL via RESPIRATORY_TRACT
  Filled 2017-12-03: qty 3

## 2017-12-03 MED ORDER — ALBUTEROL SULFATE (2.5 MG/3ML) 0.083% IN NEBU
2.5000 mg | INHALATION_SOLUTION | RESPIRATORY_TRACT | Status: DC | PRN
  Administered 2017-12-05: 2.5 mg via RESPIRATORY_TRACT
  Filled 2017-12-03: qty 3

## 2017-12-03 MED ORDER — IPRATROPIUM-ALBUTEROL 0.5-2.5 (3) MG/3ML IN SOLN
RESPIRATORY_TRACT | Status: AC
  Administered 2017-12-03: 3 mL via RESPIRATORY_TRACT
  Filled 2017-12-03: qty 3

## 2017-12-03 MED ORDER — ONDANSETRON HCL 4 MG/2ML IJ SOLN: 4.0000 mg | Freq: Four times a day (QID) | INTRAMUSCULAR | Status: DC | PRN

## 2017-12-03 MED ORDER — ALBUTEROL SULFATE (2.5 MG/3ML) 0.083% IN NEBU: 5.0000 mg | INHALATION_SOLUTION | Freq: Once | RESPIRATORY_TRACT | Status: DC

## 2017-12-03 MED ORDER — IPRATROPIUM-ALBUTEROL 0.5-2.5 (3) MG/3ML IN SOLN: 3.0000 mL | Freq: Four times a day (QID) | RESPIRATORY_TRACT | Status: DC

## 2017-12-03 MED ORDER — DONEPEZIL HCL 5 MG PO TABS
5.0000 mg | ORAL_TABLET | Freq: Every day | ORAL | Status: DC
  Administered 2017-12-03 – 2017-12-07 (×5): 5 mg via ORAL
  Filled 2017-12-03 (×7): qty 1

## 2017-12-03 MED ORDER — HEPARIN BOLUS VIA INFUSION
4000.0000 [IU] | Freq: Once | INTRAVENOUS | Status: AC
  Administered 2017-12-03: 4000 [IU] via INTRAVENOUS
  Filled 2017-12-03: qty 4000

## 2017-12-03 MED ORDER — ASPIRIN 81 MG PO CHEW
324.0000 mg | CHEWABLE_TABLET | Freq: Once | ORAL | Status: AC
  Administered 2017-12-03: 324 mg via ORAL
  Filled 2017-12-03: qty 4

## 2017-12-03 MED ORDER — ACETAMINOPHEN 650 MG RE SUPP: 650.0000 mg | Freq: Four times a day (QID) | RECTAL | Status: DC | PRN

## 2017-12-03 NOTE — H&P (Signed)
[facility] [place] at [facility]   PATIENT NAME: Lauren Koch    MR#:  [id number]  DATE OF BIRTH:  10-19-1950  DATE OF ADMISSION:  12/03/2017  PRIMARY CARE PHYSICIAN: McLean-Scocuzza, Pasty Spillers, MD   REQUESTING/REFERRING PHYSICIAN: Dr. Don Perking  CHIEF COMPLAINT:   Chief Complaint  Patient presents with  . Shortness of Breath    HISTORY OF PRESENT ILLNESS:  Lauren Koch  is a 67 y.o. female with a known history of CVA, COPD, tobacco use, diastolic dysfunction, hypertension presents to the emergency room after being discharged from an event hospital on 11/28/2017 where she was admitted for COPD exacerbation.  Patient was sent home on dexamethasone.  She continued to smoke after discharge.  Stop taking dexamethasone fearing side effects of flushing, anxiety and confusion.  Here patient is wheezing.  Was found to have troponin 0 0.13.  Mild ST depressions and is being admitted to the hospital.  Chest x-ray shows no infiltrate.  Patient had a CTA during recent admission which showed emphysema but nothing acute.  Also had echocardiogram during recent admission with ejection fraction 55 to 60%.  PAST MEDICAL HISTORY:   Past Medical History:  Diagnosis Date  . Asthma   . Cataract   . COPD (chronic obstructive pulmonary disease) (HCC)   . Cryptogenic Stroke (HCC)    a. 03/2016 L ACA, L MCX, L PCA, and R dorsal pontine territory infarcts; b. 03/2016 Carotid U/S: 1-39% bil dzs; c. 03/2016 TEE: EF 60-65%, no rwma, no LA/LAA/RA/RAA thrombus. No PFO; d. 03/2016 s/p MDT Reveal Linq (ser# [id number]) - no AFib noted to date (some sinus w/ freq PACs).  . Dementia (HCC)   . Diastolic dysfunction    a. 03/2016 Echo: EF 60-65%, gr1 DD, nl RV fxn.  Marland Kitchen GERD (gastroesophageal reflux disease)   . Hypertension   . Smoker     PAST SURGICAL HISTORY:   Past Surgical History:  Procedure Laterality Date  . ABDOMINAL HYSTERECTOMY     partial   . CESAREAN SECTION  1971/1976  . HERNIA REPAIR   2013  . LOOP RECORDER INSERTION N/A 03/23/2016   Procedure: Loop Recorder Insertion;  [name], MD;  [name];  Service: Cardiovascular;  [name];  . PARTIAL HYMENECTOMY    . TEE WITHOUT CARDIOVERSION N/A 03/23/2016   Procedure: TRANSESOPHAGEAL ECHOCARDIOGRAM (TEE);  [name], MD;  Location: [facility] ENDOSCOPY;  Service: Cardiovascular;  [name];    SOCIAL HISTORY:   Social History   Tobacco Use  . Smoking status: Current Some Day Smoker    Packs/day: 1.00    Years: 30.00    Pack years: 30.00    Types: Cigarettes  . Smokeless tobacco: Former Neurosurgeon    Quit date: 05/29/2015  Substance Use Topics  . Alcohol use: Yes    Alcohol/week: 0.0 standard drinks    Comment: occasionally    FAMILY HISTORY:   Family History  Problem Relation Age of Onset  . Diabetes Mother   . Hypertension Mother   . Miscarriages / [place] Mother   . Hyperlipidemia Mother   . Heart disease Father   . Diabetes Father   . Hypertension Father   . Hyperlipidemia Father   . Stroke Father   . Cancer Sister        skin  . Diabetes Other   . Hyperlipidemia Daughter     DRUG ALLERGIES:   Allergies  Allergen Reactions  . Prednisone Rash and Other (  See Comments)    Arrhythmia, also (Has tolerated hydrocortisone and decadron)     REVIEW OF SYSTEMS:   Review of Systems  Constitutional: Positive for malaise/fatigue. Negative for chills and fever.  HENT: Negative for sore throat.   Eyes: Negative for blurred vision, double vision and pain.  Respiratory: Positive for cough, shortness of breath and wheezing. Negative for hemoptysis.   Cardiovascular: Negative for chest pain, palpitations, orthopnea and leg swelling.  Gastrointestinal: Negative for abdominal pain, constipation, diarrhea, heartburn, nausea and vomiting.  Genitourinary: Negative for dysuria and hematuria.  Musculoskeletal: Negative for back pain and joint pain.  Skin:  Negative for rash.  Neurological: Negative for sensory change, speech change, focal weakness and headaches.  Endo/Heme/Allergies: Does not bruise/bleed easily.  Psychiatric/Behavioral: Negative for depression. The patient is not nervous/anxious.     MEDICATIONS AT HOME:   Prior to Admission medications   Medication Sig Start Date End Date Taking? Authorizing Provider  albuterol (PROVENTIL HFA;[name] [name] 03/03/15   Dorena Bodo, PA-C  atorvastatin (LIPITOR) 40 MG tablet [name] 06/10/17   McLean-Scocuzza, Pasty Spillers, MD  dexamethasone (DECADRON) 4 MG tablet Take 2 tablets (8 mg total) by mouth 2 (two) times daily. X 3 days, then 1 tablet (4 mg) two times daily x 3 days, then 1 tablet (4 mg) once daily x 3 days 11/28/17   Tat, Onalee Hua, MD  donepezil (ARICEPT) 5 MG tablet [name] 06/10/17   McLean-Scocuzza, Pasty Spillers, MD  Fluticasone-Salmeterol (ADVAIR DISKUS) 250-50 MCG/DOSE AEPB Inhale 1 puff into the lungs 2 (two) times daily. Rinse mouth 06/10/17   McLean-Scocuzza, Pasty Spillers, MD  ipratropium-albuterol (DUONEB) 0.5-2.5 (3) MG/3ML SOLN [name] 06/10/17   McLean-Scocuzza, Pasty Spillers, MD  lisinopril (PRINIVIL,[name] [name] 06/10/17   McLean-Scocuzza, Pasty Spillers, MD     VITAL SIGNS:  Blood pressure (!) 150/63, pulse 93, temperature 98.3 F (36.8 C), resp. rate (!) 21, height 5\' 1"  (1.549 m), weight 100.1 kg, SpO2 (!) 88 %.  PHYSICAL EXAMINATION:  Physical Exam  GENERAL:  67 y.o.-year-old patient lying in the bed with no acute distress.  EYES: Pupils equal, round, reactive to light and accommodation. No scleral icterus. Extraocular muscles intact.  HEENT: Head atraumatic, normocephalic. Oropharynx and nasopharynx clear.  No oropharyngeal erythema, moist oral mucosa  NECK:  Supple, no jugular venous distention. No thyroid enlargement, no tenderness.  LUNGS: Bilateral wheezing and decreased air entry. CARDIOVASCULAR: S1, S2 normal. No murmurs, rubs, or gallops.  ABDOMEN: Soft, nontender, nondistended. Bowel sounds present. No organomegaly or mass.  EXTREMITIES: No pedal edema, cyanosis, or clubbing. + 2 pedal & radial pulses b/l.   NEUROLOGIC: Cranial nerves II through XII are intact. No focal Motor or sensory deficits appreciated b/l PSYCHIATRIC: The patient is alert and oriented x 3. Good affect.  SKIN: No obvious rash, lesion, or ulcer.   LABORATORY PANEL:   CBC Recent Labs  Lab 12/03/17 1713  WBC 17.1*  HGB 14.7  HCT 44.8  PLT 385   ------------------------------------------------------------------------------------------------------------------  Chemistries  Recent Labs  Lab 11/27/17 0635 12/03/17 1713  NA 139 141  K 4.2 4.1  CL 108 100  CO2 24 30  GLUCOSE  156* 107*  BUN 19 22  CREATININE 0.83 0.94  CALCIUM 8.6* 9.0  MG 2.2  --    ------------------------------------------------------------------------------------------------------------------  Cardiac Enzymes Recent Labs  Lab 12/03/17 1713  TROPONINI 0.13*   ------------------------------------------------------------------------------------------------------------------  RADIOLOGY:  Dg Chest 1 View  Result Date: 12/03/2017 CLINICAL DATA:  Shortness of breath. EXAM: CHEST  1 VIEW COMPARISON:  Chest x-ray dated November 25, 2017. FINDINGS: Stable mild cardiomegaly with unchanged right-sided aortic arch. Normal pulmonary vascularity. Mild bibasilar atelectasis. No focal consolidation, pleural effusion, or pneumothorax. No acute osseous abnormality. IMPRESSION: Bibasilar atelectasis. Electronically Signed   By: Obie Dredge M.D.   On: 12/03/2017 18:18     IMPRESSION AND PLAN:   *Acute COPD exacerbation.  Patient is being  readmitted within 3 days of discharge from [facility].  Due to patient continuing to smoke after discharge and not taking her dexamethasone as prescribed. Start IV Solu-Medrol.  Scheduled nebulizers.  Inhalers.  No pneumonia on x-ray.  Counseled to be compliant with medications and counseled to quit smoking for greater than 3 minutes.  *Elevated troponin likely due to demand ischemia from COPD.  Will repeat troponin.  Started on heparin in the emergency room due to some ST depressions.  Consult cardiology.  Patient had echocardiogram 5 days back.  We will not repeat at this time.  Wait for cardiology input.  Consulted Dr. Excell Seltzer with [facility].  Epic message sent.  *Hypertension.  Continue medication  DVT prophylaxis.  On heparin drip  All the records are reviewed and case discussed with ED provider. Management plans discussed with the patient, family and they are in agreement.  CODE STATUS: FULL CODE  TOTAL TIME TAKING CARE OF THIS PATIENT: 40 minutes.   Molinda Bailiff Talen Poser M.D on 12/03/2017 at 7:52 PM  Between 7am to 6pm - Pager - [phone]  After 6pm go to [email or web address] - password [email or web address]  [facility]  Office  [phone]  CC: Primary care physician; McLean-Scocuzza, Pasty Spillers, MD  Note: This dictation was prepared with Dragon dictation along with smaller phrase technology. Any transcriptional errors that result from this process are unintentional.

## 2017-12-03 NOTE — ED Provider Notes (Signed)
[facility] Emergency Department Provider Note  ____________________________________________  Time seen: Approximately 6:04 PM  I have reviewed the triage vital signs and the nursing notes.   HISTORY  Chief Complaint Shortness of Breath   HPI Lauren Koch is a 67 y.o. female with a history of cryptogenic stroke, COPD, chronic respiratory failure on 2 L nasal cannula, diastolic CHF, hypertension, smoking who presents for evaluation of shortness of breath.  Patient was admitted for 3 days and discharge 5 days ago for COPD exacerbation.  She felt better until last night.  She started feeling progressively worsening shortness of breath.  Using her inhalers at home with no significant relief.  Has had a chronic dry cough.  No fever chills, no chest pain or fever, no body aches, no vomiting or diarrhea.  No personal family history of blood clots, no leg pain or swelling, no hemoptysis.  Her shortness of breath is now constant and severe, worse with minimal exertion.  No need for increased oxygen at home.  Past Medical History:  Diagnosis Date  . Asthma   . Cataract   . COPD (chronic obstructive pulmonary disease) (HCC)   . Cryptogenic Stroke (HCC)    a. 03/2016 L ACA, L MCX, L PCA, and R dorsal pontine territory infarcts; b. 03/2016 Carotid U/S: 1-39% bil dzs; c. 03/2016 TEE: EF 60-65%, no rwma, no LA/LAA/RA/RAA thrombus. No PFO; d. 03/2016 s/p MDT Reveal Linq (ser# [id number]) - no AFib noted to date (some sinus w/ freq PACs).  . Dementia (HCC)   . Diastolic dysfunction    a. 03/2016 Echo: EF 60-65%, gr1 DD, nl RV fxn.  Marland Kitchen GERD (gastroesophageal reflux disease)   . Hypertension   . Smoker     Patient Active Problem List   Diagnosis Date Noted  . Acute on chronic respiratory failure with hypoxia (HCC) 11/26/2017  . Dyspnea 11/25/2017  . Tachycardia 11/25/2017  . Syncope 06/24/2017  . Memory loss 06/10/2017  . Abnormal thyroid function test 06/10/2017  . Breast  cyst, left 06/10/2017  . Hyperlipemia 03/25/2016  . 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 Osteopenia 08/08/2015  . Pulmonary emphysema (HCC)   . Hyperglycemia 05/30/2015  . Abnormal TSH 05/30/2015  . CAP (community acquired pneumonia) 05/29/2015  . Acute respiratory failure with hypoxia (HCC) 05/29/2015  . COPD exacerbation (HCC) 05/29/2015  . Acute kidney injury (HCC) 05/29/2015  . Morbid obesity (HCC) 05/29/2015  . Tobacco abuse 05/29/2015  . Right leg pain 05/29/2015  . Essential hypertension 05/29/2015  . Vitamin D deficiency 04/17/2015  . Smoker 03/03/2015  . Asthma   . Cataract   . COPD (chronic obstructive pulmonary disease) (HCC)   . Hypertension   . GERD (gastroesophageal reflux disease)     Past Surgical History:  Procedure Laterality Date  . ABDOMINAL HYSTERECTOMY     partial   . CESAREAN SECTION  1971/1976  . HERNIA REPAIR  2013  . LOOP RECORDER INSERTION N/A 03/23/2016   Procedure: Loop Recorder Insertion;  [name], MD;  [name];  Service: Cardiovascular;  [name];  . PARTIAL HYMENECTOMY    . TEE WITHOUT CARDIOVERSION N/A 03/23/2016   Procedure: TRANSESOPHAGEAL ECHOCARDIOGRAM (TEE);  [name], MD;  Location: [facility] ENDOSCOPY;  Service: Cardiovascular;  [name];    Prior to Admission medications   Medication Sig Start Date End Date Taking? Authorizing Provider  albuterol (PROVENTIL HFA;VENTOLIN HFA)  108 (90 Base) MCG/ACT inhaler [name] 03/03/15   Dorena Bodo, PA-C  atorvastatin (LIPITOR) 40 MG tablet [name] 06/10/17   McLean-Scocuzza, Pasty Spillers, MD  dexamethasone (DECADRON) 4 MG tablet Take 2 tablets (8 mg total) by mouth 2 (two) times daily. X 3 days, then 1 tablet (4 mg) two times daily x 3 days, then 1 tablet (4 mg)  once daily x 3 days 11/28/17   Tat, Onalee Hua, MD  donepezil (ARICEPT) 5 MG tablet [name] 06/10/17   McLean-Scocuzza, Pasty Spillers, MD  Fluticasone-Salmeterol (ADVAIR DISKUS) 250-50 MCG/DOSE AEPB Inhale 1 puff into the lungs 2 (two) times daily. Rinse mouth 06/10/17   McLean-Scocuzza, Pasty Spillers, MD  ipratropium-albuterol (DUONEB) 0.5-2.5 (3) MG/3ML SOLN [name] 06/10/17   McLean-Scocuzza, Pasty Spillers, MD  lisinopril (PRINIVIL,[name] [name] 06/10/17   McLean-Scocuzza, Pasty Spillers, MD    Allergies Prednisone  Family History  Problem Relation Age of Onset  . Diabetes Mother   . Hypertension Mother   . Miscarriages / [place] Mother   . Hyperlipidemia Mother   . Heart disease Father   . Diabetes Father   . Hypertension Father   . Hyperlipidemia Father   . Stroke Father   . Cancer Sister        skin  . Diabetes Other   . Hyperlipidemia Daughter     Social History Social History   Tobacco Use  . Smoking status: Current Some Day Smoker    Packs/day: 1.00    Years: 30.00    Pack years: 30.00    Types: Cigarettes  . Smokeless tobacco: Former Neurosurgeon    Quit date: 05/29/2015  Substance Use Topics  . Alcohol use: Yes    Alcohol/week: 0.0 standard drinks    Comment: occasionally  . Drug use: No    Review of Systems  Constitutional: Negative for fever. Eyes: Negative for visual changes. ENT: Negative for sore throat. Neck: No neck pain  Cardiovascular: Negative for chest pain. Respiratory: + shortness of breath. Gastrointestinal: Negative for abdominal pain, vomiting or diarrhea. Genitourinary: Negative for dysuria. Musculoskeletal: Negative for back pain. Skin: Negative for rash. Neurological: Negative for headaches, weakness or numbness. Psych: No SI or HI  ____________________________________________   PHYSICAL EXAM:  VITAL SIGNS: ED Triage Vitals  Enc Vitals  Group     BP 12/03/17 1709 (!) 161/96     Pulse Rate 12/03/17 1709 85     Resp 12/03/17 1709 (!) 23     Temp 12/03/17 1709 98.3 F (36.8 C)     Temp src --      SpO2 12/03/17 1709 (!) 89 %     Weight 12/03/17 1710 220 lb 10.9 oz (100.1 kg)     Height 12/03/17 1710 5\' 1"  (1.549 m)     Head Circumference --      Peak Flow --      Pain Score 12/03/17 1710 0     Pain Loc --      Pain Edu? --      Excl. in GC? --     Constitutional: Alert and oriented. Well appearing and in no apparent distress. HEENT:      Head: Normocephalic and atraumatic.  Eyes: Conjunctivae are normal. Sclera is non-icteric.       Mouth/Throat: Mucous membranes are moist.       Neck: Supple with no signs of meningismus. Cardiovascular: Regular rate and rhythm. No murmurs, gallops, or rubs. 2+ symmetrical distal pulses are present in all extremities. No JVD. Respiratory: Increased work of breathing, satting 95% on 2 L, coarse rhonchi, diffuse expiratory wheezes bilaterally Gastrointestinal: Soft, non tender, and non distended with positive bowel sounds. No rebound or guarding. Musculoskeletal: Nontender with normal range of motion in all extremities. No edema, cyanosis, or erythema of extremities. Neurologic: Normal speech and language. Face is symmetric. Moving all extremities. No gross focal neurologic deficits are appreciated. Skin: Skin is warm, dry and intact. No rash noted. Psychiatric: Mood and affect are normal. Speech and behavior are normal.  ____________________________________________   LABS (all labs ordered are listed, but only abnormal results are displayed)  Labs Reviewed  BASIC METABOLIC PANEL - Abnormal; Notable for the following components:      Result Value   Glucose, Bld 107 (*)    All other components within normal limits  CBC WITH DIFFERENTIAL/PLATELET - Abnormal; Notable for the following components:   WBC 17.1 (*)    Neutro Abs 13.6 (*)    Monocytes Absolute 1.5 (*)    Abs  Immature Granulocytes 0.29 (*)    All other components within normal limits  BLOOD GAS, VENOUS - Abnormal; Notable for the following components:   Bicarbonate 32.1 (*)    Acid-Base Excess 5.5 (*)    All other components within normal limits  TROPONIN I - Abnormal; Notable for the following components:   Troponin I 0.13 (*)    All other components within normal limits  BRAIN NATRIURETIC PEPTIDE  LACTIC ACID, PLASMA  LACTIC ACID, PLASMA   ____________________________________________  EKG  ED ECG REPORT I, Nita Sickle, the attending physician, personally viewed and interpreted this ECG.  Normal sinus rhythm, rate of 92, normal intervals, normal axis, slight ST depressions in inferior lateral leads with no ST elevation. Depressions are new when compared to prior ____________________________________________  RADIOLOGY  I have personally reviewed the images performed during this visit and I agree with the Radiologist's read.   Interpretation by Radiologist:  Dg Chest 1 View  Result Date: 12/03/2017 CLINICAL DATA:  Shortness of breath. EXAM: CHEST  1 VIEW COMPARISON:  Chest x-ray dated November 25, 2017. FINDINGS: Stable mild cardiomegaly with unchanged right-sided aortic arch. Normal pulmonary vascularity. Mild bibasilar atelectasis. No focal consolidation, pleural effusion, or pneumothorax. No acute osseous abnormality. IMPRESSION: Bibasilar atelectasis. Electronically Signed   By: Obie Dredge M.D.   On: 12/03/2017 18:18      ____________________________________________   PROCEDURES  Procedure(s) performed: None Procedures Critical Care performed: yes  CRITICAL CARE Performed by: Nita Sickle  ?  Total critical care time: 40 min  Critical care time was exclusive of separately billable procedures and treating other patients.  Critical care was necessary to treat or prevent imminent or life-threatening deterioration.  Critical care was time spent  personally by me on the following activities: development of treatment plan with patient and/or surrogate as well as nursing, discussions with consultants, evaluation of patient's response to treatment, examination of patient, obtaining history from patient or surrogate, ordering and performing treatments and interventions, ordering and review of laboratory studies, ordering and review of radiographic studies, pulse oximetry and re-evaluation of patient's condition.  ____________________________________________   INITIAL IMPRESSION / ASSESSMENT AND PLAN / ED COURSE  67  y.o. female with a history of cryptogenic stroke, COPD, chronic respiratory failure on 2 L nasal cannula, diastolic CHF, hypertension, smoking who presents for evaluation of shortness of breath.  Patient with increased work of breathing, satting well on her baseline 2 L, coarse rhonchi with diffuse expiratory wheezes throughout.  EKG with new ST depressions.  Differential diagnosis including COPD versus pneumonia versus ACS versus CHF exacerbation versus pericarditis/myocarditis.  PE also possible however patient did have a recent CTA during this current illness 7 days ago negative for PE. Will give DuoNeb's and Solu-Medrol, will get labs and chest x-ray.   _________________________ 7:05 PM on 12/03/2017 -----------------------------------------  Troponin elevated at 0.13 with new EKG changes concerning for NSTEMI. Will start patient on heparin and admit to Hospitalist.      As part of my medical decision making, I reviewed the following data within the electronic MEDICAL RECORD NUMBER Nursing notes reviewed and incorporated, Labs reviewed , EKG interpreted , Old EKG reviewed, Old chart reviewed, Radiograph reviewed , Discussed with admitting physician , Notes from prior ED visits and [place] Controlled Substance Database    Pertinent labs & imaging results that were available during my care of the patient were reviewed by me and  considered in my medical decision making (see chart for details).    ____________________________________________   FINAL CLINICAL IMPRESSION(S) / ED DIAGNOSES  Final diagnoses:  NSTEMI (non-ST elevated myocardial infarction) (HCC)  COPD exacerbation (HCC)      NEW MEDICATIONS STARTED DURING THIS VISIT:  ED Discharge Orders    None       Note:  This document was prepared using Dragon voice recognition software and may include unintentional dictation errors.    Nita Sickle, MD 12/03/17 [phone]

## 2017-12-03 NOTE — Progress Notes (Signed)
ANTICOAGULATION CONSULT NOTE - Initial Consult  Pharmacy Consult for Heparin  Indication: chest pain/ACS  Allergies  Allergen Reactions  . Prednisone Rash and Other (See Comments)    Arrhythmia, also (Has tolerated hydrocortisone and decadron)     Patient Measurements: Height: 5\' 1"  (154.9 cm) Weight: 220 lb 10.9 oz (100.1 kg) IBW/kg (Calculated) : 47.8 Heparin Dosing Weight:  71.9 kg   Vital Signs: Temp: 98.3 F (36.8 C) (11/02 1709) BP: 148/76 (11/02 1900) Pulse Rate: 93 (11/02 1900)  Labs: Recent Labs    12/03/17 1713  HGB 14.7  HCT 44.8  PLT 385  CREATININE 0.94  TROPONINI 0.13*    Estimated Creatinine Clearance: 63.8 mL/min (by C-G formula based on SCr of 0.94 mg/dL).   Medical History: Past Medical History:  Diagnosis Date  . Asthma   . Cataract   . COPD (chronic obstructive pulmonary disease) (HCC)   . Cryptogenic Stroke (HCC)    a. 03/2016 L ACA, L MCX, L PCA, and R dorsal pontine territory infarcts; b. 03/2016 Carotid U/S: 1-39% bil dzs; c. 03/2016 TEE: EF 60-65%, no rwma, no LA/LAA/RA/RAA thrombus. No PFO; d. 03/2016 s/p MDT Reveal Linq (ser# [id number]) - no AFib noted to date (some sinus w/ freq PACs).  . Dementia (HCC)   . Diastolic dysfunction    a. 03/2016 Echo: EF 60-65%, gr1 DD, nl RV fxn.  Marland Kitchen GERD (gastroesophageal reflux disease)   . Hypertension   . Smoker     Medications:   (Not in a hospital admission)  Assessment: Pharmacy consulted to dose heparin in this 67 year old female admitted with ACS/NSTEMI.  CrCl = 63.8 ml/min No prior anticoag noted.   Goal of Therapy:  Heparin level 0.3-0.7 units/ml Monitor platelets by anticoagulation protocol: Yes   Plan:  Give 4000 units bolus x 1 Start heparin infusion at 850 units/hr Check anti-Xa level in 6 hours and daily while on heparin Continue to monitor H&H and platelets  Rivan Siordia D 12/03/2017,8:01 PM

## 2017-12-03 NOTE — ED Triage Notes (Addendum)
Pt comes via POV from home with c/o SHOB. Pt states this started a couple of days ago. Pt states she was seen and treated and admitted to hospital in [place] and d/c home. Pt states she is having difficulty breathing. Pt states she wears 2L at home. Respirations labored and audible wheezing present. Pt also states dry non-productive cough.  Pt also states she feel like she is going to pass out. She denies any chest pain.  Pt came without O2 because they don't have portable O2 tank. O2 currently 89 on room air. Pt placed on 2L nasal cannula and now at 90%.

## 2017-12-03 NOTE — Progress Notes (Signed)
Advance care planning  Purpose of Encounter COPD exacerbation, elevated troponin and CODE STATUS discussion  Parties in Attendance Patient  Patients Decisional capacity Alert and oriented.  Able to make medical decisions  Discussed with patient regarding readmission for COPD and all questions answered.  CODE STATUS discussed.  Patient wants her daughter Chinita [place] to be her healthcare power of attorney.  Patient is full code.  Time spent -17 minutes

## 2017-12-04 DIAGNOSIS — R7989 Other specified abnormal findings of blood chemistry: Secondary | ICD-10-CM

## 2017-12-04 LAB — BASIC METABOLIC PANEL
ANION GAP: 11 (ref 5–15)
BUN: 20 mg/dL (ref 8–23)
CHLORIDE: 103 mmol/L (ref 98–111)
CO2: 25 mmol/L (ref 22–32)
Calcium: 8.6 mg/dL — ABNORMAL LOW (ref 8.9–10.3)
Creatinine, Ser: 0.93 mg/dL (ref 0.44–1.00)
GFR calc non Af Amer: >60 mL/min (ref >60)
Glucose, Bld: 186 mg/dL — ABNORMAL HIGH (ref 70–99)
POTASSIUM: 4.3 mmol/L (ref 3.5–5.1)
Sodium: 139 mmol/L (ref 135–145)

## 2017-12-04 LAB — CBC
HEMATOCRIT: 41.6 % (ref 36.0–46.0)
HEMOGLOBIN: 13.7 g/dL (ref 12.0–15.0)
MCH: 28.4 pg (ref 26.0–34.0)
MCHC: 32.9 g/dL (ref 30.0–36.0)
MCV: 86.3 fL (ref 80.0–100.0)
NRBC: 0 % (ref 0.0–0.2)
Platelets: 314 10*3/uL (ref 150–400)
RBC: 4.82 MIL/uL (ref 3.87–5.11)
RDW: 14.6 % (ref 11.5–15.5)
WBC: 20.8 10*3/uL — AB (ref 4.0–10.5)

## 2017-12-04 LAB — TROPONIN I: Troponin I: <0.03 ng/mL (ref <0.03)

## 2017-12-04 LAB — HEPARIN LEVEL (UNFRACTIONATED)
HEPARIN UNFRACTIONATED: 0.55 [IU]/mL (ref 0.30–0.70)
HEPARIN UNFRACTIONATED: 0.25 [IU]/mL — AB (ref 0.30–0.70)

## 2017-12-04 MED ORDER — AZITHROMYCIN 250 MG PO TABS
500.0000 mg | ORAL_TABLET | Freq: Every day | ORAL | Status: AC
  Administered 2017-12-04: 500 mg via ORAL

## 2017-12-04 MED ORDER — AZITHROMYCIN 250 MG PO TABS
250.0000 mg | ORAL_TABLET | Freq: Every day | ORAL | Status: AC
  Administered 2017-12-05 – 2017-12-08 (×4): 250 mg via ORAL
  Filled 2017-12-04 (×4): qty 1

## 2017-12-04 MED ORDER — ASPIRIN EC 81 MG PO TBEC
81.0000 mg | DELAYED_RELEASE_TABLET | Freq: Every day | ORAL | Status: DC
  Administered 2017-12-04 – 2017-12-08 (×5): 81 mg via ORAL
  Filled 2017-12-04 (×5): qty 1

## 2017-12-04 MED ORDER — HEPARIN SODIUM (PORCINE) 5000 UNIT/ML IJ SOLN
5000.0000 [IU] | Freq: Three times a day (TID) | INTRAMUSCULAR | Status: DC
  Administered 2017-12-04 – 2017-12-08 (×11): 5000 [IU] via SUBCUTANEOUS
  Filled 2017-12-04 (×11): qty 1

## 2017-12-04 MED ORDER — SODIUM CHLORIDE 0.9 % IV SOLN
1.0000 g | INTRAVENOUS | Status: DC
  Administered 2017-12-04: 1 g via INTRAVENOUS
  Filled 2017-12-04: qty 10
  Filled 2017-12-04: qty 1

## 2017-12-04 NOTE — Progress Notes (Signed)
ANTICOAGULATION CONSULT NOTE - Initial Consult  Pharmacy Consult for Heparin  Indication: chest pain/ACS  Allergies  Allergen Reactions  . Prednisone Rash and Other (See Comments)    Arrhythmia, also (Has tolerated hydrocortisone and decadron)     Patient Measurements: Height: 5' (152.4 cm) Weight: 213 lb 8 oz (96.8 kg) IBW/kg (Calculated) : 45.5 Heparin Dosing Weight:  71.9 kg   Vital Signs: Temp: 98.4 F (36.9 C) (11/02 2041) Temp Source: Oral (11/02 2041) BP: 142/61 (11/02 2041) Pulse Rate: 84 (11/02 2041)  Labs: Recent Labs    12/03/17 1713 12/03/17 2149 12/04/17 0257  HGB 14.7  --  13.7  HCT 44.8  --  41.6  PLT 385  --  314  APTT 26  --   --   LABPROT 12.7  --   --   INR 0.96  --   --   HEPARINUNFRC  --   --  0.55  CREATININE 0.94  --  0.93  TROPONINI 0.13* <0.03 <0.03    Estimated Creatinine Clearance: 62 mL/min (by C-G formula based on SCr of 0.93 mg/dL).   Medical History: Past Medical History:  Diagnosis Date  . Asthma   . Cataract   . COPD (chronic obstructive pulmonary disease) (HCC)   . Cryptogenic Stroke (HCC)    a. 03/2016 L ACA, L MCX, L PCA, and R dorsal pontine territory infarcts; b. 03/2016 Carotid U/S: 1-39% bil dzs; c. 03/2016 TEE: EF 60-65%, no rwma, no LA/LAA/RA/RAA thrombus. No PFO; d. 03/2016 s/p MDT Reveal Linq (ser# [id number]) - no AFib noted to date (some sinus w/ freq PACs).  . Dementia (HCC)   . Diastolic dysfunction    a. 03/2016 Echo: EF 60-65%, gr1 DD, nl RV fxn.  Marland Kitchen GERD (gastroesophageal reflux disease)   . Hypertension   . Smoker     Medications:  Medications Prior to Admission  Medication Sig Dispense Refill Last Dose  . albuterol (PROVENTIL HFA;[name] [name] 1 Inhaler 2 Past Month at Unknown time  . atorvastatin (LIPITOR) 40 MG tablet [name] 90 tablet  1 11/24/2017 at Unknown time  . dexamethasone (DECADRON) 4 MG tablet Take 2 tablets (8 mg total) by mouth 2 (two) times daily. X 3 days, then 1 tablet (4 mg) two times daily x 3 days, then 1 tablet (4 mg) once daily x 3 days 21 tablet 0   . donepezil (ARICEPT) 5 MG tablet [name] 90 tablet 1 11/24/2017 at Unknown time  . Fluticasone-Salmeterol (ADVAIR DISKUS) 250-50 MCG/DOSE AEPB Inhale 1 puff into the lungs 2 (two) times daily. Rinse mouth 60 each 11 Past Month at Unknown time  . ipratropium-albuterol (DUONEB) 0.5-2.5 (3) MG/3ML SOLN [name] 360 mL 11 Past Month at Unknown time  . lisinopril (PRINIVIL,[name] [name] 90 tablet 1 11/24/2017 at Unknown time    Assessment: Pharmacy consulted to dose heparin in this 67 year old female admitted with ACS/NSTEMI.  CrCl = 63.8 ml/min No prior anticoag noted.   Goal of Therapy:  Heparin level 0.3-0.7 units/ml Monitor platelets by anticoagulation protocol: Yes   Plan:  Give 4000 units bolus x 1 Start heparin infusion at 850 units/hr Check anti-Xa  level in 6 hours and daily while on heparin Continue to monitor H&H and platelets   11/3 0300 heparin level 0.55. Continue current regimen. Recheck in 6 hours to confirm.  Advay Volante S 12/04/2017,4:09 AM

## 2017-12-04 NOTE — Progress Notes (Signed)
ANTICOAGULATION CONSULT NOTE - Initial Consult  Pharmacy Consult for Heparin  Indication: chest pain/ACS  Allergies  Allergen Reactions  . Prednisone Rash and Other (See Comments)    Arrhythmia, also (Has tolerated hydrocortisone and decadron)     Patient Measurements: Height: 5' (152.4 cm) Weight: 211 lb 1.6 oz (95.8 kg) IBW/kg (Calculated) : 45.5 Heparin Dosing Weight:  71.9 kg   Vital Signs: Temp: 97.6 F (36.4 C) (11/03 0445) Temp Source: Oral (11/03 0445) BP: 167/92 (11/03 0812) Pulse Rate: 61 (11/03 0812)  Labs: Recent Labs    12/03/17 1713 12/03/17 2149 12/04/17 0257 12/04/17 0914  HGB 14.7  --  13.7  --   HCT 44.8  --  41.6  --   PLT 385  --  314  --   APTT 26  --   --   --   LABPROT 12.7  --   --   --   INR 0.96  --   --   --   HEPARINUNFRC  --   --  0.55 0.25*  CREATININE 0.94  --  0.93  --   TROPONINI 0.13* <0.03 <0.03  --     Estimated Creatinine Clearance: 61.6 mL/min (by C-G formula based on SCr of 0.93 mg/dL).   Medical History: Past Medical History:  Diagnosis Date  . Asthma   . Cataract   . COPD (chronic obstructive pulmonary disease) (HCC)   . Cryptogenic Stroke (HCC)    a. 03/2016 L ACA, L MCX, L PCA, and R dorsal pontine territory infarcts; b. 03/2016 Carotid U/S: 1-39% bil dzs; c. 03/2016 TEE: EF 60-65%, no rwma, no LA/LAA/RA/RAA thrombus. No PFO; d. 03/2016 s/p MDT Reveal Linq (ser# [id number]) - no AFib noted to date (some sinus w/ freq PACs).  . Dementia (HCC)   . Diastolic dysfunction    a. 03/2016 Echo: EF 60-65%, gr1 DD, nl RV fxn.  Marland Kitchen GERD (gastroesophageal reflux disease)   . Hypertension   . Smoker     Medications:  Medications Prior to Admission  Medication Sig Dispense Refill Last Dose  . albuterol (PROVENTIL HFA;[name] [name] 1 Inhaler 2 Past Month at Unknown time  . atorvastatin (LIPITOR) 40 MG  tablet [name] 90 tablet 1 11/24/2017 at Unknown time  . dexamethasone (DECADRON) 4 MG tablet Take 2 tablets (8 mg total) by mouth 2 (two) times daily. X 3 days, then 1 tablet (4 mg) two times daily x 3 days, then 1 tablet (4 mg) once daily x 3 days 21 tablet 0   . donepezil (ARICEPT) 5 MG tablet [name] 90 tablet 1 11/24/2017 at Unknown time  . Fluticasone-Salmeterol (ADVAIR DISKUS) 250-50 MCG/DOSE AEPB Inhale 1 puff into the lungs 2 (two) times daily. Rinse mouth 60 each 11 Past Month at Unknown time  . ipratropium-albuterol (DUONEB) 0.5-2.5 (3) MG/3ML SOLN [name] 360 mL 11 Past Month at Unknown time  . lisinopril (PRINIVIL,[name] [name] 90 tablet 1 11/24/2017 at Unknown time    Assessment: Pharmacy consulted to dose heparin in this 67 year old female admitted with ACS/NSTEMI.  CrCl = 63.8 ml/min No prior anticoag noted.   Goal of Therapy:  Heparin  level 0.3-0.7 units/ml Monitor platelets by anticoagulation protocol: Yes   HEPARIN COURSE Give 4000 units bolus x 1 Start heparin infusion at 850 units/hr Check anti-Xa level in 6 hours and daily while on heparin Continue to monitor H&H and platelets   11/3 0300 heparin level 0.55. Continue current regimen. Recheck in 6 hours to confirm.  Plan: 11/3 0914 heparin level subtherapeutic. Increase rate to 1000 units/hr and recheck HL in 6 hours.  Carola Frost, Pharm.D., BCPS Clinical Pharmacist 12/04/2017,10:16 AM

## 2017-12-04 NOTE — Progress Notes (Signed)
[facility] - [place] at [facility]   PATIENT NAME: Lauren Koch    MR#:  [id number]  DATE OF BIRTH:  August 27, 1950  SUBJECTIVE: Patient admitted for COPD exacerbation, still wheezing.  Just discharged recently.  Patient daughter mentioned that she is allergic to oral steroids can take IV steroids.  She thinks that oral steroids gAVE her allergy.  She was a heavy smoker but now in the process of quitting for about 1 to 2 cigarettes a day.  CHIEF COMPLAINT:   Chief Complaint  Patient presents with  . Shortness of Breath    REVIEW OF SYSTEMS:   ROS CONSTITUTIONAL: No fever, fatigue or weakness.  EYES: No blurred or double vision.  EARS, NOSE, AND THROAT: No tinnitus or ear pain.  RESPIRATORY: Cough, shortness of breath. CARDIOVASCULAR: No chest pain, orthopnea, edema.  GASTROINTESTINAL: No nausea, vomiting, diarrhea or abdominal pain.  GENITOURINARY: No dysuria, hematuria.  ENDOCRINE: No polyuria, nocturia,  HEMATOLOGY: No anemia, easy bruising or bleeding SKIN: No rash or lesion. MUSCULOSKELETAL: No joint pain or arthritis.   NEUROLOGIC: No tingling, numbness, weakness.  PSYCHIATRY: No anxiety or depression.   DRUG ALLERGIES:   Allergies  Allergen Reactions  . Prednisone Rash and Other (See Comments)    Arrhythmia, also (Has tolerated hydrocortisone and decadron)     VITALS:  Blood pressure (!) 167/92, pulse 61, temperature 97.6 F (36.4 C), temperature source Oral, resp. rate 20, height 5' (1.524 m), weight 95.8 kg, SpO2 92 %.  PHYSICAL EXAMINATION:  GENERAL:  67 y.o.-year-old patient lying in the bed with no acute distress.  EYES: Pupils equal, round, reactive to light and accommodation. No scleral icterus. Extraocular muscles intact.  HEENT: Head atraumatic, normocephalic. Oropharynx and nasopharynx clear.  NECK:  Supple, no jugular venous distention. No thyroid enlargement, no tenderness.  LUNGS: Bilateral expiratory wheeze in all lung fields.   CARDIOVASCULAR: S1, S2 normal. No murmurs, rubs, or gallops.  ABDOMEN: Soft, nontender, nondistended. Bowel sounds present. No organomegaly or mass.  EXTREMITIES: No pedal edema, cyanosis, or clubbing.  NEUROLOGIC: Cranial nerves II through XII are intact. Muscle strength 5/5 in all extremities. Sensation intact. Gait not checked.  PSYCHIATRIC: The patient is alert and oriented x 3.  SKIN: No obvious rash, lesion, or ulcer.    LABORATORY PANEL:   CBC Recent Labs  Lab 12/04/17 0257  WBC 20.8*  HGB 13.7  HCT 41.6  PLT 314   ------------------------------------------------------------------------------------------------------------------  Chemistries  Recent Labs  Lab 12/04/17 0257  NA 139  K 4.3  CL 103  CO2 25  GLUCOSE 186*  BUN 20  CREATININE 0.93  CALCIUM 8.6*   ------------------------------------------------------------------------------------------------------------------  Cardiac Enzymes Recent Labs  Lab 12/04/17 0257  TROPONINI <0.03   ------------------------------------------------------------------------------------------------------------------  RADIOLOGY:  Dg Chest 1 View  Result Date: 12/03/2017 CLINICAL DATA:  Shortness of breath. EXAM: CHEST  1 VIEW COMPARISON:  Chest x-ray dated November 25, 2017. FINDINGS: Stable mild cardiomegaly with unchanged right-sided aortic arch. Normal pulmonary vascularity. Mild bibasilar atelectasis. No focal consolidation, pleural effusion, or pneumothorax. No acute osseous abnormality. IMPRESSION: Bibasilar atelectasis. Electronically Signed   By: Obie Dredge M.D.   On: 12/03/2017 18:18    EKG:   Orders placed or performed during the hospital encounter of 12/03/17  . ED EKG  . ED EKG  . EKG 12-Lead  . EKG 12-Lead    ASSESSMENT AND PLAN:   COPD exacerbation: Patient clinically still wheezing, continue bronchodilators, IV steroids.  Patient cannot tolerate p.o. steroids.  Start empiric antibiotics.  Daughter  mentioned that she gets inhalers, antibiotics at discharge and without steroid. 2.  Slightly elevated troponins, troponin peaked up to 0.13 but now decreased and normalized.  Likely supply demand ischemia rather than heart attack, started by admitting doctor, cardiology consult appreciated.  Patient has implantable loop recorder because of history of present stroke, loop recorder was placed last year as per patient's daughter. 3.  Essential hypertension: Controlled. #4 leukocytosis secondary to steroids.   More than 50% time spent in counseling, coordination of care.  All the records are reviewed and case discussed with Care Management/Social Workerr. Management plans discussed with the patient, family and they are in agreement.  CODE STATUS: Full code  TOTAL TIME TAKING CARE OF THIS PATIENT: 35 minutes.   POSSIBLE D/C IN1-2DAYS, DEPENDING ON CLINICAL CONDITION.   Katha Hamming M.D on 12/04/2017 at 12:58 PM  Between 7am to 6pm - Pager - [phone]  After 6pm go to [email or web address] - password [email or web address]  [facility]  Office  [phone]  CC: Primary care physician; McLean-Scocuzza, Pasty Spillers, MD   Note: This dictation was prepared with Dragon dictation along with smaller phrase technology. Any transcriptional errors that result from this process are unintentional.

## 2017-12-04 NOTE — Consult Note (Addendum)
Cardiology Consultation:   Patient ID: Lauren Koch MRN: [id number]; DOB: 04/23/1950  Admit date: 12/03/2017 Date of Consult: 12/04/2017  Primary Care Provider: McLean-Scocuzza, Pasty Spillers, MD Primary Cardiologist: Lewayne Bunting, MD  Primary Electrophysiologist:  None    Patient Profile:   Lauren Koch is a 67 y.o. female with a hx of COPD who is being seen today for the evaluation of elevated troponin at the request of Sudini.  History of Present Illness:   Lauren Koch is admitted for progressive cough and shortness of breath related to COPD exacerbation.  She was recently hospitalized for the same, but presented yesterday for readmission.  The patient is on home oxygen.  She continues to smoke cigarettes.  We are asked to see her because her troponin is mildly elevated.  The patient's daughter is at the bedside.  The patient has some chest pain only associated with coughing.  Pain is in the lower chest on the left side.  She has no substernal pain.  She has no history of cardiac disease, but does have a history of stroke.  She recently had an echocardiogram demonstrated normal LV systolic function.  There are no regional wall motion abnormalities.  Troponin trend is 0.13, less than 0.03, less than 0.03.  Her BNP is normal at 68.  Past Medical History:  Diagnosis Date  . Asthma   . Cataract   . COPD (chronic obstructive pulmonary disease) (HCC)   . Cryptogenic Stroke (HCC)    a. 03/2016 L ACA, L MCX, L PCA, and R dorsal pontine territory infarcts; b. 03/2016 Carotid U/S: 1-39% bil dzs; c. 03/2016 TEE: EF 60-65%, no rwma, no LA/LAA/RA/RAA thrombus. No PFO; d. 03/2016 s/p MDT Reveal Linq (ser# [id number]) - no AFib noted to date (some sinus w/ freq PACs).  . Dementia (HCC)   . Diastolic dysfunction    a. 03/2016 Echo: EF 60-65%, gr1 DD, nl RV fxn.  Marland Kitchen GERD (gastroesophageal reflux disease)   . Hypertension   . Smoker     Past Surgical History:  Procedure Laterality Date  . ABDOMINAL  HYSTERECTOMY     partial   . CESAREAN SECTION  1971/1976  . HERNIA REPAIR  2013  . LOOP RECORDER INSERTION N/A 03/23/2016   Procedure: Loop Recorder Insertion;  [name], MD;  [name];  Service: Cardiovascular;  [name];  . PARTIAL HYMENECTOMY    . TEE WITHOUT CARDIOVERSION N/A 03/23/2016   Procedure: TRANSESOPHAGEAL ECHOCARDIOGRAM (TEE);  [name], MD;  Location: [facility] ENDOSCOPY;  Service: Cardiovascular;  [name];     Home Medications:  Prior to Admission medications   Medication Sig Start Date End Date Taking? Authorizing Provider  albuterol (PROVENTIL HFA;[name] [name] 03/03/15   Dorena Bodo, PA-C  atorvastatin (LIPITOR) 40 MG tablet [name] 06/10/17   McLean-Scocuzza, Pasty Spillers, MD  dexamethasone (DECADRON) 4 MG tablet Take 2 tablets (8 mg total) by mouth 2 (two) times daily. X 3 days, then 1 tablet (4 mg) two times daily x 3 days, then 1 tablet (4 mg) once daily x 3 days 11/28/17   Tat, Onalee Hua, MD  donepezil (ARICEPT) 5 MG tablet [name] 06/10/17   McLean-Scocuzza, Pasty Spillers, MD  Fluticasone-Salmeterol (ADVAIR DISKUS)  250-50 MCG/DOSE AEPB Inhale 1 puff into the lungs 2 (two) times daily. Rinse mouth 06/10/17   McLean-Scocuzza, Pasty Spillers, MD  ipratropium-albuterol (DUONEB) 0.5-2.5 (3) MG/3ML SOLN [name] 06/10/17   McLean-Scocuzza, Pasty Spillers, MD  lisinopril (PRINIVIL,[name] [name] 06/10/17   McLean-Scocuzza, Pasty Spillers, MD    Inpatient Medications: Scheduled Meds: . atorvastatin  40 mg Oral q1800  . donepezil  5 mg Oral QHS  . ipratropium-albuterol  3 mL Nebulization QID  . lisinopril  40 mg Oral Daily  . methylPREDNISolone (SOLU-MEDROL) injection   60 mg Intravenous BID  . mometasone-formoterol  2 puff Inhalation BID  . sodium chloride flush  3 mL Intravenous Q12H   Continuous Infusions: . heparin 1,000 Units/hr (12/04/17 1025)   PRN Meds: acetaminophen **OR** acetaminophen, albuterol, ondansetron **OR** ondansetron (ZOFRAN) IV, polyethylene glycol  Allergies:    Allergies  Allergen Reactions  . Prednisone Rash and Other (See Comments)    Arrhythmia, also (Has tolerated hydrocortisone and decadron)     Social History:   Social History   Socioeconomic History  . Marital status: Widowed    Spouse name: Not on file  . Number of children: Not on file  . Years of education: Not on file  . Highest education level: Not on file  Occupational History  . Not on file  Social Needs  . Financial resource strain: Not on file  . Food insecurity:    Worry: Not on file    Inability: Not on file  . Transportation needs:    Medical: Not on file    Non-medical: Not on file  Tobacco Use  . Smoking status: Current Some Day Smoker    Packs/day: 1.00    Years: 30.00    Pack years: 30.00    Types: Cigarettes  . Smokeless tobacco: Former Neurosurgeon    Quit date: 05/29/2015  Substance and Sexual Activity  . Alcohol use: Yes    Alcohol/week: 0.0 standard drinks    Comment: occasionally  . Drug use: No  . Sexual activity: Not Currently    Birth control/protection: None  Lifestyle  . Physical activity:    Days per week: Not on file    Minutes per session: Not on file  . Stress: Not on file  Relationships  . Social connections:    Talks on phone: Not on file    Gets together: Not on file    Attends religious service: Not on file    Active member of club or organization: Not on file    Attends meetings of clubs or organizations: Not on file    Relationship status: Not on file  . Intimate partner violence:    Fear of current or ex partner: Not on file    Emotionally abused: Not on file    Physically abused: Not on file    Forced  sexual activity: Not on file  Other Topics Concern  . 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 Diabetes Mother   . Hypertension Mother   . Miscarriages / [place] Mother   .  Hyperlipidemia Mother   . Heart disease Father   . Diabetes Father   . Hypertension Father   . Hyperlipidemia Father   . Stroke Father   . Cancer Sister        skin  . Diabetes Other   . Hyperlipidemia Daughter      ROS:  Please see the history of present illness.  Positive for shortness of breath, decreased mobility, cough All other ROS reviewed and negative.     Physical Exam/Data:   Vitals:   12/04/17 0445 12/04/17 0812 12/04/17 0902 12/04/17 1143  BP: (!) 149/69 (!) 167/92    Pulse: (!) 56 61    Resp: 18 20    Temp: 97.6 F (36.4 C)     TempSrc: Oral     SpO2: 94% 93% 93% 92%  Weight: 95.8 kg     Height:        Intake/Output Summary (Last 24 hours) at 12/04/2017 1304 Last data filed at 12/04/2017 1100 Gross per 24 hour  Intake 84.42 ml  Output 0 ml  Net 84.42 ml   Filed Weights   12/03/17 1710 12/03/17 2041 12/04/17 0445  Weight: 100.1 kg 96.8 kg 95.8 kg   Body mass index is 41.23 kg/m.  General: Obese, chronically ill-appearing woman in no acute distress HEENT: normal Lymph: no adenopathy Neck: no JVD, JVP is difficult to visualize Endocrine:  No thryomegaly Vascular: No carotid bruits Cardiac:  normal S1, S2; RRR; no murmur  Lungs: Diffuse rhonchi bilaterally, decreased breath sounds bilaterally Abd: soft, nontender, no hepatomegaly  Ext: Trace bilateral pretibial edema Musculoskeletal:  No deformities, BUE and BLE strength normal and equal Skin: warm and dry  Neuro:  CNs 2-12 intact, no focal abnormalities noted Psych:  Normal affect     EKG:  The EKG was personally reviewed and demonstrates:  NSR with nonspecific ST abnormality  Telemetry:  Telemetry was personally reviewed and demonstrates: Normal sinus rhythm without significant arrhythmia  Relevant CV Studies: 2D Echo: Left ventricle:  The cavity size was normal. Wall thickness was increased in a pattern of mild LVH. Systolic function was normal. The estimated ejection fraction was in the range of 60% to 65%. Wall motion was normal; there were no regional wall motion abnormalities. Doppler parameters are consistent with abnormal left ventricular relaxation (grade 1 diastolic dysfunction). There was no evidence of elevated ventricular filling pressure by Doppler parameters.  ------------------------------------------------------------------- Aortic valve:   Trileaflet.  Doppler:   There was no stenosis. There was no significant regurgitation.    VTI ratio of LVOT to aortic valve: 0.76. Valve area (VTI): 2.4 cm^2. Indexed valve area (VTI): 1.15 cm^2/m^2. Peak velocity ratio of LVOT to aortic valve: 0.73. Valve area (Vmax): 2.29 cm^2. Indexed valve area (Vmax): 1.09 cm^2/m^2. Mean velocity ratio of LVOT to aortic valve: 0.82. Valve area (Vmean): 2.58 cm^2. Indexed valve area (Vmean): 1.23 cm^2/m^2.    Mean gradient (S): 10 mm Hg. Peak gradient (S): 22 mm Hg.  ------------------------------------------------------------------- Aorta:  Aortic root: The aortic root was normal in size.  ------------------------------------------------------------------- Mitral valve:   Normal thickness leaflets .  Doppler:   There was no evidence for stenosis.   There was no significant regurgitation.    Peak gradient (D): 3 mm Hg.  ------------------------------------------------------------------- Left atrium:  The atrium was mildly dilated.  ------------------------------------------------------------------- Atrial septum:  No defect or patent foramen ovale was  identified.   ------------------------------------------------------------------- Right ventricle:  The cavity size was normal. Wall thickness was normal. Systolic function was  normal.  ------------------------------------------------------------------- Pulmonic valve:   Not well visualized.  Doppler:   There was no evidence for stenosis.   There was no significant regurgitation.   ------------------------------------------------------------------- Tricuspid valve:   Normal thickness leaflets.  Doppler:   There was no evidence for stenosis.   There was no significant regurgitation.   ------------------------------------------------------------------- Pulmonary artery:    Systolic pressure could not be accurately estimated.   Inadequate TR jet.  ------------------------------------------------------------------- Right atrium:  The atrium was normal in size.  ------------------------------------------------------------------- Pericardium:  There was no pericardial effusion.  CT angio 11-26-2017: IMPRESSION: 1. No pulmonary emboli. 2. Interval atelectasis in the inferior aspect of the right upper lobe and mild dependent atelectasis in the left lower lobe. 3. Mild patchy prominence of the interstitial markings with mild progression, most pronounced in the left lower lobe. This could represent minimal interstitial pulmonary edema or minimal interstitial pneumonitis. 4. Stable right aortic arch. 5. 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 6. Small hiatal hernia.  Laboratory Data:  Chemistry Recent Labs  Lab 12/03/17 1713 12/04/17 0257  NA 141 139  K 4.1 4.3  CL 100 103  CO2 30 25  GLUCOSE 107* 186*  BUN 22 20  CREATININE 0.94 0.93  CALCIUM 9.0 8.6*  GFRNONAA >60 >60  GFRAA >60 >60  ANIONGAP 11 11    No results for input(s): PROT, ALBUMIN, AST, ALT, ALKPHOS, BILITOT in the last 168 hours. Hematology Recent Labs  Lab  12/03/17 1713 12/04/17 0257  WBC 17.1* 20.8*  RBC 5.11 4.82  HGB 14.7 13.7  HCT 44.8 41.6  MCV 87.7 86.3  MCH 28.8 28.4  MCHC 32.8 32.9  RDW 14.6 14.6  PLT 385 314   Cardiac Enzymes Recent Labs  Lab 12/03/17 1713 12/03/17 2149 12/04/17 0257  TROPONINI 0.13* <0.03 <0.03   No results for input(s): TROPIPOC in the last 168 hours.  BNP Recent Labs  Lab 12/03/17 1712  BNP 67.0    DDimer No results for input(s): DDIMER in the last 168 hours.  Radiology/Studies:  Dg Chest 1 View  Result Date: 12/03/2017 CLINICAL DATA:  Shortness of breath. EXAM: CHEST  1 VIEW COMPARISON:  Chest x-ray dated November 25, 2017. FINDINGS: Stable mild cardiomegaly with unchanged right-sided aortic arch. Normal pulmonary vascularity. Mild bibasilar atelectasis. No focal consolidation, pleural effusion, or pneumothorax. No acute osseous abnormality. IMPRESSION: Bibasilar atelectasis. Electronically Signed   By: Obie Dredge M.D.   On: 12/03/2017 18:18    Assessment and Plan:   1. Troponin elevation, mild 2. COPD exacerbation, O2 dependent, with continued smoking 3. Morbid obesity BMI greater than 40  The patient had a recent echocardiogram demonstrating normal LV systolic function.  She has minimal troponin elevation with no symptoms or EKG changes suggestive of ACS.  I would not recommend any further cardiac testing at this point.  Considering her overall poor health status, O2 dependence, morbid obesity, and multiple medical problems, I do not think she is a candidate for invasive cardiac evaluation and would not recommend stress testing because of limited accuracy with her body habitus.  Please call if any questions.  Recommend medical therapy and supportive care.  The patient is treated with a high intensity statin drug.  Will add low-dose aspirin for antiplatelet therapy.  Would avoid a beta-blocker with active COPD exacerbation. Would DC IV heparin and change to DVT-prophylaxis dosing.  [facility] will sign off.   Medication Recommendations:  Add ASA 81 mg daily, continue other  cardiac medication Other recommendations (labs, testing, etc):  none Follow up as an outpatient:  Primary Care  For questions or updates, please contact [facility] Please consult [email or web address] for contact info under     Signed, Tonny Bollman, MD  12/04/2017 1:04 PM

## 2017-12-04 NOTE — Progress Notes (Signed)
ANTICOAGULATION CONSULT NOTE - Initial Consult  Pharmacy Consult for Heparin  Indication: chest pain/ACS  Allergies  Allergen Reactions  . Prednisone Rash and Other (See Comments)    Arrhythmia, also (Has tolerated hydrocortisone and decadron)     Patient Measurements: Height: 5' (152.4 cm) Weight: 211 lb 1.6 oz (95.8 kg) IBW/kg (Calculated) : 45.5 Heparin Dosing Weight:  71.9 kg   Vital Signs: Temp: 97.6 F (36.4 C) (11/03 0445) Temp Source: Oral (11/03 0445) BP: 167/92 (11/03 0812) Pulse Rate: 61 (11/03 0812)  Labs: Recent Labs    12/03/17 1713 12/03/17 2149 12/04/17 0257 12/04/17 0914  HGB 14.7  --  13.7  --   HCT 44.8  --  41.6  --   PLT 385  --  314  --   APTT 26  --   --   --   LABPROT 12.7  --   --   --   INR 0.96  --   --   --   HEPARINUNFRC  --   --  0.55 0.25*  CREATININE 0.94  --  0.93  --   TROPONINI 0.13* <0.03 <0.03  --     Estimated Creatinine Clearance: 61.6 mL/min (by C-G formula based on SCr of 0.93 mg/dL).   Medical History: Past Medical History:  Diagnosis Date  . Asthma   . Cataract   . COPD (chronic obstructive pulmonary disease) (HCC)   . Cryptogenic Stroke (HCC)    a. 03/2016 L ACA, L MCX, L PCA, and R dorsal pontine territory infarcts; b. 03/2016 Carotid U/S: 1-39% bil dzs; c. 03/2016 TEE: EF 60-65%, no rwma, no LA/LAA/RA/RAA thrombus. No PFO; d. 03/2016 s/p MDT Reveal Linq (ser# [id number]) - no AFib noted to date (some sinus w/ freq PACs).  . Dementia (HCC)   . Diastolic dysfunction    a. 03/2016 Echo: EF 60-65%, gr1 DD, nl RV fxn.  Marland Kitchen GERD (gastroesophageal reflux disease)   . Hypertension   . Smoker     Medications:  Medications Prior to Admission  Medication Sig Dispense Refill Last Dose  . albuterol (PROVENTIL HFA;[name] [name] 1 Inhaler 2 Past Month at Unknown time  . atorvastatin (LIPITOR) 40 MG  tablet [name] 90 tablet 1 11/24/2017 at Unknown time  . dexamethasone (DECADRON) 4 MG tablet Take 2 tablets (8 mg total) by mouth 2 (two) times daily. X 3 days, then 1 tablet (4 mg) two times daily x 3 days, then 1 tablet (4 mg) once daily x 3 days 21 tablet 0   . donepezil (ARICEPT) 5 MG tablet [name] 90 tablet 1 11/24/2017 at Unknown time  . Fluticasone-Salmeterol (ADVAIR DISKUS) 250-50 MCG/DOSE AEPB Inhale 1 puff into the lungs 2 (two) times daily. Rinse mouth 60 each 11 Past Month at Unknown time  . ipratropium-albuterol (DUONEB) 0.5-2.5 (3) MG/3ML SOLN [name] 360 mL 11 Past Month at Unknown time  . lisinopril (PRINIVIL,[name] [name] 90 tablet 1 11/24/2017 at Unknown time    Assessment: Pharmacy consulted to dose heparin in this 66 year old female admitted with ACS/NSTEMI.  CrCl = 63.8 ml/min No prior anticoag noted.   Goal of Therapy:  Heparin  level 0.3-0.7 units/ml Monitor platelets by anticoagulation protocol: Yes   HEPARIN COURSE Give 4000 units bolus x 1 Start heparin infusion at 850 units/hr Check anti-Xa level in 6 hours and daily while on heparin Continue to monitor H&H and platelets   11/3 0300 heparin level 0.55. Continue current regimen. Recheck in 6 hours to confirm.  11/3 0914 heparin level subtherapeutic. Increase rate to 1000 units/hr and recheck HL in 6 hours.  Plan: 11/3 Dr. Excell Seltzer discontinues IV heparin consult and enters consult for VTE prophylaxis dosing. Stop IV heparin infusion. Start heparin 5000 units subcutaneously TID. Pharmacy will monitor per protocol.   Carola Frost, Pharm.D., BCPS Clinical Pharmacist 12/04/2017,2:16 PM

## 2017-12-05 MED ORDER — BUDESONIDE 0.25 MG/2ML IN SUSP
0.2500 mg | Freq: Two times a day (BID) | RESPIRATORY_TRACT | Status: DC
  Administered 2017-12-05 – 2017-12-07 (×4): 0.25 mg via RESPIRATORY_TRACT
  Filled 2017-12-05 (×4): qty 2

## 2017-12-05 MED ORDER — METHYLPREDNISOLONE SODIUM SUCC 40 MG IJ SOLR
40.0000 mg | Freq: Two times a day (BID) | INTRAMUSCULAR | Status: DC
  Administered 2017-12-05 – 2017-12-07 (×4): 40 mg via INTRAVENOUS
  Filled 2017-12-05 (×4): qty 1

## 2017-12-05 NOTE — Progress Notes (Addendum)
[facility] - [place] at [facility]   PATIENT NAME: Lauren Koch    MR#:  [id number]  DATE OF BIRTH:  01/07/1951  SUBJECTIVE: Patient is still wheezing and has cough.  CHIEF COMPLAINT:   Chief Complaint  Patient presents with  . Shortness of Breath    REVIEW OF SYSTEMS:   ROS CONSTITUTIONAL: No fever, fatigue or weakness.  EYES: No blurred or double vision.  EARS, NOSE, AND THROAT: No tinnitus or ear pain.  RESPIRATORY: Has cough, shortness of breath. CARDIOVASCULAR: No chest pain, orthopnea, edema.  GASTROINTESTINAL: No nausea, vomiting, diarrhea or abdominal pain.  GENITOURINARY: No dysuria, hematuria.  ENDOCRINE: No polyuria, nocturia,  HEMATOLOGY: No anemia, easy bruising or bleeding SKIN: No rash or lesion. MUSCULOSKELETAL: No joint pain or arthritis.   NEUROLOGIC: No tingling, numbness, weakness.  PSYCHIATRY: No anxiety or depression.   DRUG ALLERGIES:   Allergies  Allergen Reactions  . Prednisone Rash and Other (See Comments)    Arrhythmia, also (Has tolerated hydrocortisone and decadron)     VITALS:  Blood pressure (!) 106/57, pulse 65, temperature 97.8 F (36.6 C), temperature source Oral, resp. rate 19, height 5' (1.524 m), weight 98.4 kg, SpO2 93 %.  PHYSICAL EXAMINATION:  GENERAL:  67 y.o.-year-old patient lying in the bed with no acute distress.  EYES: Pupils equal, round, reactive to light and accommodation. No scleral icterus. Extraocular muscles intact.  HEENT: Head atraumatic, normocephalic. Oropharynx and nasopharynx clear.  NECK:  Supple, no jugular venous distention. No thyroid enlargement, no tenderness.  LUNGS: Bilateral expiratory wheeze in all lung fields.  CARDIOVASCULAR: S1, S2 normal. No murmurs, rubs, or gallops.  ABDOMEN: Soft, nontender, nondistended. Bowel sounds present. No organomegaly or mass.  EXTREMITIES: No pedal edema, cyanosis, or clubbing.  NEUROLOGIC: Cranial nerves II through XII are intact. Muscle  strength 5/5 in all extremities. Sensation intact. Gait not checked.  PSYCHIATRIC: The patient is alert and oriented x 3.  SKIN: No obvious rash, lesion, or ulcer.    LABORATORY PANEL:   CBC Recent Labs  Lab 12/04/17 0257  WBC 20.8*  HGB 13.7  HCT 41.6  PLT 314   ------------------------------------------------------------------------------------------------------------------  Chemistries  Recent Labs  Lab 12/04/17 0257  NA 139  K 4.3  CL 103  CO2 25  GLUCOSE 186*  BUN 20  CREATININE 0.93  CALCIUM 8.6*   ------------------------------------------------------------------------------------------------------------------  Cardiac Enzymes Recent Labs  Lab 12/04/17 0257  TROPONINI <0.03   ------------------------------------------------------------------------------------------------------------------  RADIOLOGY:  Dg Chest 1 View  Result Date: 12/03/2017 CLINICAL DATA:  Shortness of breath. EXAM: CHEST  1 VIEW COMPARISON:  Chest x-ray dated November 25, 2017. FINDINGS: Stable mild cardiomegaly with unchanged right-sided aortic arch. Normal pulmonary vascularity. Mild bibasilar atelectasis. No focal consolidation, pleural effusion, or pneumothorax. No acute osseous abnormality. IMPRESSION: Bibasilar atelectasis. Electronically Signed   By: Obie Dredge M.D.   On: 12/03/2017 18:18    EKG:   Orders placed or performed during the hospital encounter of 12/03/17  . ED EKG  . ED EKG  . EKG 12-Lead  . EKG 12-Lead    ASSESSMENT AND PLAN:   COPD exacerbation: Patient clinically still wheezing, continue bronchodilators, IV steroids.  Patient cannot tolerate p.o. steroids.  Start empiric antibiotics.  Daughter mentioned that she gets inhalers, antibiotics at discharge and without steroid. Continue IV steroids for today, DC Rocephin, continue Zithromax, add Pulmicort.has chronic  Respiratory failure on 2litres  Of  o2 at home. 2.  Slightly elevated troponins, troponin  peaked up  to 0.13 but now decreased and normalized.  Likely supply demand ischemia rather than heart attack, started by admitting doctor, cardiology consult appreciated.  Patient has implantable loop recorder because of history of present stroke, loop recorder was placed last year as per patient's daughter.  Seen by cardiology, discontinued heparin drip. 3.  Essential hypertension: Controlled. #4 leukocytosis secondary to steroids. Patient needs Spiriva prescription at discharge.  Likely discharge home tomorrow. All the records are reviewed and case discussed with Care Management/Social Workerr. Management plans discussed with the patient, family and they are in agreement.  CODE STATUS: Full code  TOTAL TIME TAKING CARE OF THIS PATIENT: 35 minutes.   POSSIBLE D/C IN1-2DAYS, DEPENDING ON CLINICAL CONDITION.   Katha Hamming M.D on 12/05/2017 at 1:01 PM  Between 7am to 6pm - Pager - [phone]  After 6pm go to [email or web address] - password [email or web address]  [facility]  Office  [phone]  CC: Primary care physician; McLean-Scocuzza, Pasty Spillers, MD   Note: This dictation was prepared with Dragon dictation along with smaller phrase technology. Any transcriptional errors that result from this process are unintentional.

## 2017-12-05 NOTE — Plan of Care (Signed)
  Problem: Clinical Measurements: Goal: Ability to maintain clinical measurements within normal limits will improve Outcome: Not Progressing Note:  Patient's most recent W.B.C. value is elevated at 20.8. Will continue to monitor immune system status. Jari Favre [facility]

## 2017-12-06 ENCOUNTER — Inpatient Hospital Stay: Payer: Medicare Other | Admitting: Internal Medicine

## 2017-12-06 LAB — CBC
HEMATOCRIT: 41.1 % (ref 36.0–46.0)
HEMOGLOBIN: 13 g/dL (ref 12.0–15.0)
MCH: 28.6 pg (ref 26.0–34.0)
MCHC: 31.6 g/dL (ref 30.0–36.0)
MCV: 90.5 fL (ref 80.0–100.0)
Platelets: 321 10*3/uL (ref 150–400)
RBC: 4.54 MIL/uL (ref 3.87–5.11)
RDW: 15 % (ref 11.5–15.5)
WBC: 19.5 10*3/uL — ABNORMAL HIGH (ref 4.0–10.5)
nRBC: 0 % (ref 0.0–0.2)

## 2017-12-06 NOTE — Plan of Care (Signed)
  Problem: Education: Goal: Knowledge of General Education information will improve Description Including pain rating scale, medication(s)/side effects and non-pharmacologic comfort measures Outcome: Progressing   Problem: Health Behavior/Discharge Planning: Goal: Ability to manage health-related needs will improve Outcome: Progressing   Problem: Clinical Measurements: Goal: Ability to maintain clinical measurements within normal limits will improve Outcome: Progressing Goal: Will remain free from infection Outcome: Progressing Goal: Diagnostic test results will improve Outcome: Progressing Goal: Respiratory complications will improve Outcome: Progressing Goal: Cardiovascular complication will be avoided Outcome: Progressing   Problem: Coping: Goal: Level of anxiety will decrease Outcome: Progressing   Problem: Pain Managment: Goal: General experience of comfort will improve Outcome: Progressing   Problem: Safety: Goal: Ability to remain free from injury will improve Outcome: Progressing   Problem: Education: Goal: Knowledge of disease or condition will improve Outcome: Progressing Goal: Knowledge of the prescribed therapeutic regimen will improve Outcome: Progressing Goal: Individualized Educational Video(s) Outcome: Progressing   Problem: Respiratory: Goal: Ability to maintain a clear airway will improve Outcome: Progressing Goal: Levels of oxygenation will improve Outcome: Progressing Goal: Ability to maintain adequate ventilation will improve Outcome: Progressing

## 2017-12-06 NOTE — Care Management Important Message (Signed)
Copy of signed IM left with patient in room.  

## 2017-12-06 NOTE — Progress Notes (Signed)
[facility] - [place] at [facility]      PATIENT NAME: Lauren Koch    MR#:  [id number]  DATE OF BIRTH:  04-Oct-1950  SUBJECTIVE:   Patient here due to shortness of breath and wheezing secondary to COPD exacerbation.  Noted to have a mildly elevated troponin which is secondary to demand ischemia.  Patient still having some wheezing but feels overall better.  REVIEW OF SYSTEMS:    Review of Systems  Constitutional: Negative for chills and fever.  HENT: Negative for congestion and tinnitus.   Eyes: Negative for blurred vision and double vision.  Respiratory: Positive for shortness of breath and wheezing. Negative for cough.   Cardiovascular: Negative for chest pain, orthopnea and PND.  Gastrointestinal: Negative for abdominal pain, diarrhea, nausea and vomiting.  Genitourinary: Negative for dysuria and hematuria.  Neurological: Negative for dizziness, sensory change and focal weakness.  All other systems reviewed and are negative.   Nutrition: Heart Healthy Tolerating Diet: Yes Tolerating PT: Ambulatory  DRUG ALLERGIES:   Allergies  Allergen Reactions  . Prednisone Rash and Other (See Comments)    Arrhythmia, also (Has tolerated hydrocortisone and decadron)     VITALS:  Blood pressure (!) 156/76, pulse 70, temperature 97.7 F (36.5 C), temperature source Oral, resp. rate 18, height 5' (1.524 m), weight 97.1 kg, SpO2 95 %.  PHYSICAL EXAMINATION:   Physical Exam  GENERAL:  67 y.o.-year-old patient lying in bed in no acute distress.  EYES: Pupils equal, round, reactive to light and accommodation. No scleral icterus. Extraocular muscles intact.  HEENT: Head atraumatic, normocephalic. Oropharynx and nasopharynx clear.  NECK:  Supple, no jugular venous distention. No thyroid enlargement, no tenderness.  LUNGS: Normal breath sounds bilaterally, minimal end-exp. Wheezing b/l, No rales, rhonchi. No use of accessory muscles of respiration.  CARDIOVASCULAR: S1,  S2 normal. No murmurs, rubs, or gallops.  ABDOMEN: Soft, nontender, nondistended. Bowel sounds present. No organomegaly or mass.  EXTREMITIES: No cyanosis, clubbing or edema b/l.    NEUROLOGIC: Cranial nerves II through XII are intact. No focal Motor or sensory deficits b/l.   PSYCHIATRIC: The patient is alert and oriented x 3.  SKIN: No obvious rash, lesion, or ulcer.    LABORATORY PANEL:   CBC Recent Labs  Lab 12/06/17 0418  WBC 19.5*  HGB 13.0  HCT 41.1  PLT 321   ------------------------------------------------------------------------------------------------------------------  Chemistries  Recent Labs  Lab 12/04/17 0257  NA 139  K 4.3  CL 103  CO2 25  GLUCOSE 186*  BUN 20  CREATININE 0.93  CALCIUM 8.6*   ------------------------------------------------------------------------------------------------------------------  Cardiac Enzymes Recent Labs  Lab 12/04/17 0257  TROPONINI <0.03   ------------------------------------------------------------------------------------------------------------------  RADIOLOGY:  No results found.   ASSESSMENT AND PLAN:   67 year old female with past medical history of COPD, hypertension, GERD, CHF, history of previous CVA who presented to the hospital due to shortness of breath.  1.  COPD exacerbation-this is the cause of patient's shortness of breath and wheezing. - Improving, continue IV steroids, scheduled duo nebs, Pulmicort nebs.  Chest x-ray negative for acute pneumonia, continue Zithromax. -Patient is already on oxygen at home but may need it continuously.  2.  Elevated troponin-secondary to supply demand ischemia.  Seen by cardiology, no plans for acute intervention.  Troponins have not trended up.  Patient has no acute chest pain.  Off heparin drip now.  Continue aspirin, statin.  3.  History of previous CVA-continue aspirin, atorvastatin.  4.  Essential hypertension-continue lisinopril.  5.  Dementia-continue  Aricept.  6.  Hyperlipidemia-continue atorvastatin.  Possible discharge Home tomorrow    All the records are reviewed and case discussed with Care Management/Social Worker. Management plans discussed with the patient, family and they are in agreement.  CODE STATUS: Full code  DVT Prophylaxis: Hep. SQ  TOTAL TIME TAKING CARE OF THIS PATIENT: 30 minutes.   POSSIBLE D/C IN 1-2 DAYS, DEPENDING ON CLINICAL CONDITION.   Houston Siren M.D on 12/06/2017 at 3:12 PM  Between 7am to 6pm - Pager - [phone]  After 6pm go to [email or web address] - Social research officer, government  [employer] [place] [facility]  Office  [phone]  CC: Primary care physician; McLean-Scocuzza, Pasty Spillers, MD

## 2017-12-06 NOTE — Plan of Care (Signed)
Patient is using oxygen PRN, like she does a home. Patient denies any shortness of breath. Patient is ambulating in the room without any complications. Will continue to monitor.

## 2017-12-07 MED ORDER — IPRATROPIUM-ALBUTEROL 0.5-2.5 (3) MG/3ML IN SOLN
3.0000 mL | RESPIRATORY_TRACT | Status: DC
  Administered 2017-12-07 – 2017-12-08 (×6): 3 mL via RESPIRATORY_TRACT
  Filled 2017-12-07 (×7): qty 3

## 2017-12-07 MED ORDER — BUDESONIDE 0.5 MG/2ML IN SUSP
0.5000 mg | Freq: Two times a day (BID) | RESPIRATORY_TRACT | Status: DC
  Administered 2017-12-07 – 2017-12-08 (×2): 0.5 mg via RESPIRATORY_TRACT
  Filled 2017-12-07 (×2): qty 2

## 2017-12-07 MED ORDER — METHYLPREDNISOLONE SODIUM SUCC 125 MG IJ SOLR
60.0000 mg | Freq: Four times a day (QID) | INTRAMUSCULAR | Status: DC
  Administered 2017-12-07 – 2017-12-08 (×4): 60 mg via INTRAVENOUS
  Filled 2017-12-07 (×4): qty 2

## 2017-12-07 NOTE — Progress Notes (Signed)
[facility] - [place] at [facility]      PATIENT NAME: Lauren Koch    MR#:  [id number]  DATE OF BIRTH:  [birth year]  SUBJECTIVE:   Patient still having some significant wheezing and bronchospasm.  Shortness of breath is improved.  REVIEW OF SYSTEMS:    Review of Systems  Constitutional: Negative for chills and fever.  HENT: Negative for congestion and tinnitus.   Eyes: Negative for blurred vision and double vision.  Respiratory: Positive for shortness of breath and wheezing. Negative for cough.   Cardiovascular: Negative for chest pain, orthopnea and PND.  Gastrointestinal: Negative for abdominal pain, diarrhea, nausea and vomiting.  Genitourinary: Negative for dysuria and hematuria.  Neurological: Negative for dizziness, sensory change and focal weakness.  All other systems reviewed and are negative.   Nutrition: Heart Healthy Tolerating Diet: Yes Tolerating PT: Ambulatory  DRUG ALLERGIES:   Allergies  Allergen Reactions  . Prednisone Rash and Other (See Comments)    Arrhythmia, also (Has tolerated hydrocortisone and decadron)     VITALS:  Blood pressure (!) 145/78, pulse 64, temperature (!) 97.4 F (36.3 C), temperature source Oral, resp. rate 18, height 5' (1.524 m), weight 97 kg, SpO2 93 %.  PHYSICAL EXAMINATION:   Physical Exam  GENERAL:  67 y.o.-year-old patient lying in bed in no acute distress.  EYES: Pupils equal, round, reactive to light and accommodation. No scleral icterus. Extraocular muscles intact.  HEENT: Head atraumatic, normocephalic. Oropharynx and nasopharynx clear.  NECK:  Supple, no jugular venous distention. No thyroid enlargement, no tenderness.  LUNGS: Normal breath sounds bilaterally, diffuse insp. & exp. wheezing, No rales, rhonchi. No use of accessory muscles of respiration.  CARDIOVASCULAR: S1, S2 normal. No murmurs, rubs, or gallops.  ABDOMEN: Soft, nontender, nondistended. Bowel sounds present. No organomegaly or  mass.  EXTREMITIES: No cyanosis, clubbing or edema b/l.    NEUROLOGIC: Cranial nerves II through XII are intact. No focal Motor or sensory deficits b/l. Globally weak   PSYCHIATRIC: The patient is alert and oriented x 3.  SKIN: No obvious rash, lesion, or ulcer.    LABORATORY PANEL:   CBC Recent Labs  Lab 12/06/17 0418  WBC 19.5*  HGB 13.0  HCT 41.1  PLT 321   ------------------------------------------------------------------------------------------------------------------  Chemistries  Recent Labs  Lab 12/04/17 0257  NA 139  K 4.3  CL 103  CO2 25  GLUCOSE 186*  BUN 20  CREATININE 0.93  CALCIUM 8.6*   ------------------------------------------------------------------------------------------------------------------  Cardiac Enzymes Recent Labs  Lab 12/04/17 0257  TROPONINI <0.03   ------------------------------------------------------------------------------------------------------------------  RADIOLOGY:  No results found.   ASSESSMENT AND PLAN:   67 year old female with past medical history of COPD, hypertension, GERD, CHF, history of previous CVA who presented to the hospital due to shortness of breath.  1.  COPD exacerbation-this is the cause of patient's shortness of breath and wheezing. -Continues to have significant wheezing and bronchospasm.  Will increase IV steroids, increased frequency of scheduled duo nebs add Pulmicort nebs, continue empiric Zithromax. -Patient is already on oxygen at home but may need it continuously.  2.  Elevated troponin-secondary to supply demand ischemia.  Seen by cardiology, no plans for acute intervention.  Troponins have not trended up.  Patient has no acute chest pain.  Off heparin drip now.  Continue aspirin, statin.  3.  History of previous CVA-continue aspirin, atorvastatin.  4.  Essential hypertension-continue lisinopril.  5.  Dementia-continue Aricept.  6.  Hyperlipidemia-continue atorvastatin.  Plan  discharge home tomorrow if  wheezing and bronchospasm is improved.  All the records are reviewed and case discussed with Care Management/Social Worker. Management plans discussed with the patient, family and they are in agreement.  CODE STATUS: Full code  DVT Prophylaxis: Hep. SQ  TOTAL TIME TAKING CARE OF THIS PATIENT: 30 minutes.   POSSIBLE D/C IN 1-2 DAYS, DEPENDING ON CLINICAL CONDITION.   Houston Siren M.D on 12/07/2017 at 3:41 PM  Between 7am to 6pm - Pager - [phone]  After 6pm go to [email or web address] - Social research officer, government  [employer] [place] [facility]  Office  [phone]  CC: Primary care physician; McLean-Scocuzza, Pasty Spillers, MD

## 2017-12-07 NOTE — Plan of Care (Signed)
  Problem: Clinical Measurements: Goal: Ability to maintain clinical measurements within normal limits will improve Outcome: Not Progressing Note:  W.B.C.'s are still elevated at 19.5 today. Will continue to monitor immune system response to a COPD flair-up. Lauren Koch

## 2017-12-08 ENCOUNTER — Ambulatory Visit (INDEPENDENT_AMBULATORY_CARE_PROVIDER_SITE_OTHER): Payer: Medicare Other | Admitting: *Deleted

## 2017-12-08 DIAGNOSIS — R55 Syncope and collapse: Secondary | ICD-10-CM

## 2017-12-08 MED ORDER — TIOTROPIUM BROMIDE MONOHYDRATE 18 MCG IN CAPS: 18.0000 ug | ORAL_CAPSULE | Freq: Every day | RESPIRATORY_TRACT | 1 refills | Status: DC

## 2017-12-08 MED ORDER — DEXAMETHASONE 4 MG PO TABS: ORAL_TABLET | ORAL | 0 refills | Status: DC

## 2017-12-08 MED ORDER — NICOTINE 21 MG/24HR TD PT24: 21.0000 mg | MEDICATED_PATCH | Freq: Every day | TRANSDERMAL | 0 refills | Status: DC

## 2017-12-08 MED ORDER — TIOTROPIUM BROMIDE MONOHYDRATE 18 MCG IN CAPS
18.0000 ug | ORAL_CAPSULE | Freq: Every day | RESPIRATORY_TRACT | Status: DC
  Filled 2017-12-08: qty 5

## 2017-12-08 NOTE — Progress Notes (Signed)
Carelink Summary Report / Loop Recorder 

## 2017-12-08 NOTE — Discharge Summary (Signed)
[employer] [facility] - [place] at [facility]   PATIENT NAME: Lauren Koch    MR#:  [id number]  DATE OF BIRTH:  [birth year]  DATE OF ADMISSION:  12/03/2017 ADMITTING PHYSICIAN: Milagros Loll, MD  DATE OF DISCHARGE: 12/08/2017  1:40 PM  PRIMARY CARE PHYSICIAN: McLean-Scocuzza, Pasty Spillers, MD    ADMISSION DIAGNOSIS:  COPD exacerbation (HCC) [J44.1] NSTEMI (non-ST elevated myocardial infarction) (HCC) [I21.4]  DISCHARGE DIAGNOSIS:  Active Problems:   COPD exacerbation (HCC)   SECONDARY DIAGNOSIS:   Past Medical History:  Diagnosis Date  . Asthma   . Cataract   . COPD (chronic obstructive pulmonary disease) (HCC)   . Cryptogenic Stroke (HCC)    a. 03/2016 L ACA, L MCX, L PCA, and R dorsal pontine territory infarcts; b. 03/2016 Carotid U/S: 1-39% bil dzs; c. 03/2016 TEE: EF 60-65%, no rwma, no LA/LAA/RA/RAA thrombus. No PFO; d. 03/2016 s/p MDT Reveal Linq (ser# [id number]) - no AFib noted to date (some sinus w/ freq PACs).  . Dementia (HCC)   . Diastolic dysfunction    a. 03/2016 Echo: EF 60-65%, gr1 DD, nl RV fxn.  Marland Kitchen GERD (gastroesophageal reflux disease)   . Hypertension   . Smoker     HOSPITAL COURSE:   67 year old female with past medical history of COPD, hypertension, GERD, CHF, history of previous CVA who presented to the hospital due to shortness of breath.  1.  COPD exacerbation-this was the cause of patient's shortness of breath and wheezing. -Patient was treated with IV steroids, scheduled duo nebs, Pulmicort nebs and empiric Zithromax.  Patient's chest x-ray is negative for acute pneumonia.  Patient's COPD exacerbation was secondary to ongoing tobacco abuse. - After aggressive treatment with steroids and pulmonary toileting patient's shortness of breath and wheezing has improved.  She still has some wheezing but it significantly improved since admission.  She is already on oxygen at home. - She is now being discharged on oral Decadron taper, maintenance of her  inhalers and home oxygen.  2.  Elevated troponin-secondary to supply demand ischemia.  Seen by cardiology, no plans for acute intervention.  Troponins have not trended up.  Patient has no acute chest pain.  She will Continue aspirin, statin.  3.  History of previous CVA- she will continue aspirin, atorvastatin.  4.  Essential hypertension- she will continue lisinopril.  5.  Dementia- she will continue Aricept.  6.  Hyperlipidemia- she will continue atorvastatin.  7. Tobacco abuse - pt. Was given a prescription for Nicotine patch upon discharge.   DISCHARGE CONDITIONS:   Stable.   CONSULTS OBTAINED:    DRUG ALLERGIES:   Allergies  Allergen Reactions  . Prednisone Rash and Other (See Comments)    Arrhythmia, also (Has tolerated hydrocortisone and decadron)     DISCHARGE MEDICATIONS:   Allergies as of 12/08/2017      Reactions   Prednisone Rash, Other (See Comments)   Arrhythmia, also (Has tolerated hydrocortisone and decadron)      Medication List    TAKE these medications   albuterol 108 (90 Base) MCG/ACT inhaler Commonly known as:  PROVENTIL HFA;VENTOLIN HFA [name]   atorvastatin 40 MG tablet Commonly known as:  LIPITOR [name]   dexamethasone 4 MG tablet Commonly known as:  DECADRON Decadron 4 mg PO BID X 1 week, Decadron 4 mg Daily X 1 week,  Decadron 2 mg Daily X 1 week and then STOP. What changed:    how much to take  how to take this  when to take this  additional instructions   donepezil 5 MG tablet Commonly known as:  ARICEPT [name]   Fluticasone-Salmeterol 250-50 MCG/DOSE Aepb Commonly known as:  ADVAIR Inhale 1 puff into the lungs 2 (two) times daily. Rinse mouth   ipratropium-albuterol 0.5-2.5 (3) MG/3ML Soln Commonly known as:  DUONEB Take 3 mLs by  nebulization every 6 (six) hours. What changed:    when to take this  reasons to take this   lisinopril 20 MG tablet Commonly known as:  PRINIVIL,ZESTRIL [name] What changed:    how much to take  how to take this  when to take this  additional instructions   nicotine 21 mg/24hr patch Commonly known as:  NICODERM CQ - dosed in mg/24 hours Place 1 patch (21 mg total) onto the skin daily.   tiotropium 18 MCG inhalation capsule Commonly known as:  SPIRIVA Place 1 capsule (18 mcg total) into inhaler and inhale daily.         DISCHARGE INSTRUCTIONS:   DIET:  Cardiac diet  DISCHARGE CONDITION:  Stable  ACTIVITY:  Activity as tolerated  OXYGEN:  Home Oxygen: Yes.     Oxygen Delivery: 2 liters/min via Patient connected to nasal cannula oxygen  DISCHARGE LOCATION:  home   If you experience worsening of your admission symptoms, develop shortness of breath, life threatening emergency, suicidal or homicidal thoughts you must seek medical attention immediately by calling 911 or calling your MD immediately  if symptoms less severe.  You Must read complete instructions/literature along with all the possible adverse reactions/side effects for all the Medicines you take and that have been prescribed to you. Take any new Medicines after you have completely understood and accpet all the possible adverse reactions/side effects.   Please note  You were cared for by a hospitalist during your hospital stay. If you have any questions about your discharge medications or the care you received while you were in the hospital after you are discharged, you can call the unit and asked to speak with the hospitalist on call if the hospitalist that took care of you is not available. Once you are discharged, your primary care physician will handle any further medical issues. Please note that NO REFILLS for any discharge medications will be authorized once you are  discharged, as it is imperative that you return to your primary care physician (or establish a relationship with a primary care physician if you do not have one) for your aftercare needs so that they can reassess your need for medications and monitor your lab values.     Today   Has some wheezing and bronchospasm but much improved since admission.  Denies any worsening shortness of breath chest pain nausea or vomiting.  Will discharge home today on a oral steroid taper.  VITAL SIGNS:  Blood pressure (!) 143/74, pulse (!) 52, temperature (!) 97.4 F (36.3 C), temperature source Oral, resp. rate 16, height 5' (1.524 m), weight 96.3 kg, SpO2 95 %.  I/O:    Intake/Output Summary (Last 24 hours) at 12/08/2017 1540 Last data filed at 12/08/2017 1100 Gross per 24 hour  Intake -  Output 201 ml  Net -201 ml    PHYSICAL EXAMINATION:   GENERAL:  67 y.o.-year-old patient lying  in bed in no acute distress.  EYES: Pupils equal, round, reactive to light and accommodation. No scleral icterus. Extraocular muscles intact.  HEENT: Head atraumatic, normocephalic. Oropharynx and nasopharynx clear.  NECK:  Supple, no jugular venous distention. No thyroid enlargement, no tenderness.  LUNGS: Normal breath sounds bilaterally,exp wheezing b/l, No rales, rhonchi. No use of accessory muscles of respiration.  CARDIOVASCULAR: S1, S2 normal. No murmurs, rubs, or gallops.  ABDOMEN: Soft, nontender, nondistended. Bowel sounds present. No organomegaly or mass.  EXTREMITIES: No cyanosis, clubbing or edema b/l.    NEUROLOGIC: Cranial nerves II through XII are intact. No focal Motor or sensory deficits b/l. Globally weak   PSYCHIATRIC: The patient is alert and oriented x 3.  SKIN: No obvious rash, lesion, or ulcer.   DATA REVIEW:   CBC Recent Labs  Lab 12/06/17 0418  WBC 19.5*  HGB 13.0  HCT 41.1  PLT 321    Chemistries  Recent Labs  Lab 12/04/17 0257  NA 139  K 4.3  CL 103  CO2 25  GLUCOSE 186*   BUN 20  CREATININE 0.93  CALCIUM 8.6*    Cardiac Enzymes Recent Labs  Lab 12/04/17 0257  TROPONINI <0.03    RADIOLOGY:  No results found.    Management plans discussed with the patient, family and they are in agreement.  CODE STATUS:     Code Status Orders  (From admission, onward)         Start     Ordered   12/03/17 1950  Full code  Continuous     12/03/17 1950        Code Status History    Date Active Date Inactive Code Status Order ID Comments User Context   11/25/2017 2317 11/28/2017 1954 Full Code [id number]  Pearson Grippe, MD ED   TOTAL TIME TAKING CARE OF THIS PATIENT: 40 minutes.    Houston Siren M.D on 12/08/2017 at 3:40 PM  Between 7am to 6pm - Pager - [phone]  After 6pm go to [email or web address] - Social research officer, government  [employer] [facility]  Office  [phone]  CC: Primary care physician; McLean-Scocuzza, Pasty Spillers, MD

## 2017-12-08 NOTE — Care Management Note (Signed)
Case Management Note  Patient Details  Name: AYLEE LITTRELL MRN: [id number] Date of Birth: [birth year]  Subjective/Objective:   Patient is from home with granddaughter.  Admitted with COPD exacerbation.  Chronic O2 at 2L.  Current with PCP.  Denies difficulties accessing medical care or medications.  No transportation issues.  Offered home health services; patient has declined.  No further needs identified at this time by [facility].                Action/Plan:   Expected Discharge Date:  12/08/17               Expected Discharge Plan:  Home/Self Care  In-House Referral:     Discharge planning Services  CM Consult  Post Acute Care Choice:    Choice offered to:     DME Arranged:    DME Agency:     HH Arranged:    HH Agency:     Status of Service:  Completed, signed off  If discussed at [employer] of Stay Meetings, dates discussed:    Additional Comments:  Sherren Kerns, RN 12/08/2017, 2:23 PM

## 2017-12-09 ENCOUNTER — Inpatient Hospital Stay: Payer: Medicare Other | Admitting: Internal Medicine

## 2017-12-17 LAB — BLOOD GAS, VENOUS
Acid-Base Excess: 5.5 mmol/L — ABNORMAL HIGH (ref 0.0–2.0)
Bicarbonate: 32.1 mmol/L — ABNORMAL HIGH (ref 20.0–28.0)
FIO2: 0.28
Patient temperature: 37
pCO2, Ven: 53 mmHg (ref 44.0–60.0)
pH, Ven: 7.39 (ref 7.250–7.430)

## 2017-12-21 ENCOUNTER — Encounter: Payer: Self-pay | Admitting: Internal Medicine

## 2017-12-21 ENCOUNTER — Ambulatory Visit (INDEPENDENT_AMBULATORY_CARE_PROVIDER_SITE_OTHER): Payer: Medicare Other | Admitting: Internal Medicine

## 2017-12-21 ENCOUNTER — Other Ambulatory Visit: Payer: Self-pay | Admitting: Internal Medicine

## 2017-12-21 VITALS — BP 92/60 | HR 55 | Temp 97.5°F | Ht 60.0 in | Wt 207.4 lb

## 2017-12-21 DIAGNOSIS — R5381 Other malaise: Secondary | ICD-10-CM | POA: Diagnosis not present

## 2017-12-21 DIAGNOSIS — I10 Essential (primary) hypertension: Secondary | ICD-10-CM | POA: Diagnosis not present

## 2017-12-21 DIAGNOSIS — J439 Emphysema, unspecified: Secondary | ICD-10-CM

## 2017-12-21 DIAGNOSIS — D72829 Elevated white blood cell count, unspecified: Secondary | ICD-10-CM

## 2017-12-21 DIAGNOSIS — J441 Chronic obstructive pulmonary disease with (acute) exacerbation: Secondary | ICD-10-CM

## 2017-12-21 DIAGNOSIS — E785 Hyperlipidemia, unspecified: Secondary | ICD-10-CM | POA: Diagnosis not present

## 2017-12-21 DIAGNOSIS — R32 Unspecified urinary incontinence: Secondary | ICD-10-CM | POA: Insufficient documentation

## 2017-12-21 DIAGNOSIS — Z13220 Encounter for screening for lipoid disorders: Secondary | ICD-10-CM

## 2017-12-21 DIAGNOSIS — Z1329 Encounter for screening for other suspected endocrine disorder: Secondary | ICD-10-CM

## 2017-12-21 DIAGNOSIS — E559 Vitamin D deficiency, unspecified: Secondary | ICD-10-CM

## 2017-12-21 DIAGNOSIS — F17200 Nicotine dependence, unspecified, uncomplicated: Secondary | ICD-10-CM

## 2017-12-21 DIAGNOSIS — Z13818 Encounter for screening for other digestive system disorders: Secondary | ICD-10-CM

## 2017-12-21 DIAGNOSIS — R269 Unspecified abnormalities of gait and mobility: Secondary | ICD-10-CM

## 2017-12-21 DIAGNOSIS — K5900 Constipation, unspecified: Secondary | ICD-10-CM

## 2017-12-21 LAB — LIPID PANEL
CHOL/HDL RATIO: 4
Cholesterol: 208 mg/dL — ABNORMAL HIGH (ref 0–200)
HDL: 59.1 mg/dL (ref >39.00)
LDL CALC: 126 mg/dL — AB (ref 0–99)
NONHDL: 149.02
Triglycerides: 116 mg/dL (ref 0.0–149.0)
VLDL: 23.2 mg/dL (ref 0.0–40.0)

## 2017-12-21 LAB — CBC WITH DIFFERENTIAL/PLATELET
BASOS ABS: 19 {cells}/uL (ref 0–200)
Basophils Relative: 0.4 %
EOS ABS: 160 {cells}/uL (ref 15–500)
Eosinophils Relative: 3.4 %
HCT: 40.7 % (ref 35.0–45.0)
Hemoglobin: 13.7 g/dL (ref 11.7–15.5)
Lymphs Abs: 1184 cells/uL (ref 850–3900)
MCH: 28.8 pg (ref 27.0–33.0)
MCHC: 33.7 g/dL (ref 32.0–36.0)
MCV: 85.7 fL (ref 80.0–100.0)
MPV: 9.2 fL (ref 7.5–12.5)
Monocytes Relative: 9.3 %
Neutro Abs: 2900 cells/uL (ref 1500–7800)
Neutrophils Relative %: 61.7 %
PLATELETS: 253 10*3/uL (ref 140–400)
RBC: 4.75 10*6/uL (ref 3.80–5.10)
RDW: 13.2 % (ref 11.0–15.0)
TOTAL LYMPHOCYTE: 25.2 %
WBC mixed population: 437 cells/uL (ref 200–950)
WBC: 4.7 10*3/uL (ref 3.8–10.8)

## 2017-12-21 LAB — TSH: TSH: 1.65 u[IU]/mL (ref 0.35–4.50)

## 2017-12-21 LAB — VITAMIN D 25 HYDROXY (VIT D DEFICIENCY, FRACTURES): VITD: 11.28 ng/mL — ABNORMAL LOW (ref 30.00–100.00)

## 2017-12-21 MED ORDER — CHOLECALCIFEROL 1.25 MG (50000 UT) PO CAPS: 50000.0000 [IU] | ORAL_CAPSULE | ORAL | 1 refills | Status: DC

## 2017-12-21 NOTE — Progress Notes (Signed)
Note sent electronically 

## 2017-12-21 NOTE — Progress Notes (Signed)
Pre visit review using our clinic review tool, if applicable. No additional management support is needed unless otherwise documented below in the visit note. 

## 2017-12-21 NOTE — Progress Notes (Signed)
Chief Complaint  Patient presents with  . Follow-up   HFU with daughter and need for hospital bed, manual wheelchair, and pull ups 1. COPD exacberation 10/25-10/28/19 and again 12/03/17-12/08/17 with elevated troponin cards eval and no intervention. CT chest c/w interstitial pneumonitis and mild cardiomegaly. Daughter reports breathing is better she is on O2 was on 2 L now on 3L. She is still wheezing. She stopped smoking early 12/2017 but was smoking 2 cig up until then. Daughter reports she took her back after the 1st discharge b/c she does not think she can tolerate decadron and she is nervous able giving it to her again with h/o prednisone allergy.  She was on Advair and spiriva at home  2. Hospital bed request-daughter reports pts bed is too low and she does better as far as COPD with head of the bed elevated and patient has been physically weak lately though she is using a walker and cane at home  3. Physical deconditioning worse since let the hospital last 2 x they did not send her home with H/H PT but family is interested. They would also like a manual wheelchair as using a walker and cane and it is difficult to get around outside of the house and she has balance issues though no falls  4. Overactive bladder and due to #3 having to wear pull ups changing 4x per day. Unable to walk to the bathroom fast enough    Review of Systems  Constitutional: Negative for weight loss.  HENT: Negative for hearing loss.   Eyes: Negative for blurred vision.  Respiratory: Positive for shortness of breath and wheezing. Negative for cough.   Cardiovascular: Negative for chest pain.  Gastrointestinal: Positive for constipation. Negative for abdominal pain.  Musculoskeletal: Negative for falls.  Skin: Negative for rash.  Neurological: Positive for weakness.  Psychiatric/Behavioral: Positive for memory loss.   Past Medical History:  Diagnosis Date  . Asthma   . Cataract   . COPD (chronic obstructive  pulmonary disease) ([place])   . Cryptogenic Stroke ([place])    a. 03/2016 L ACA, L MCX, L PCA, and R dorsal pontine territory infarcts; b. 03/2016 Carotid U/S: 1-39% bil dzs; c. 03/2016 TEE: EF 60-65%, no rwma, no LA/LAA/RA/RAA thrombus. No PFO; d. 03/2016 s/p MDT Reveal Linq (ser# [id number]) - no AFib noted to date (some sinus w/ freq PACs).  . Dementia ([place])   . Diastolic dysfunction    a. 03/2016 Echo: EF 60-65%, gr1 DD, nl RV fxn.  Marland Kitchen GERD (gastroesophageal reflux disease)   . Hypertension   . Smoker   . Vitamin D deficiency    Past Surgical History:  Procedure Laterality Date  . ABDOMINAL HYSTERECTOMY     partial   . CESAREAN SECTION  1971/1976  . HERNIA REPAIR  2013  . LOOP RECORDER INSERTION N/A 03/23/2016   Procedure: Loop Recorder Insertion;  Surgeon: Evans Lance, MD;  Location: [facility];  Service: Cardiovascular;  [name];  . PARTIAL HYMENECTOMY    . TEE WITHOUT CARDIOVERSION N/A 03/23/2016   Procedure: TRANSESOPHAGEAL ECHOCARDIOGRAM (TEE);  Surgeon: Skeet Latch, MD;  Location: [facility];  Service: Cardiovascular;  [name];   Family History  Problem Relation Age of Onset  . Diabetes Mother   . Hypertension Mother   . Miscarriages / [place] Mother   . Hyperlipidemia Mother   . Heart disease Father   . Diabetes Father   . Hypertension Father   . Hyperlipidemia Father   .  Stroke Father   . Cancer Sister        skin  . Diabetes Other   . Hyperlipidemia Daughter    Social History   Socioeconomic History  . Marital status: Widowed    Spouse name: Not on file  . Number of children: Not on file  . Years of education: Not on file  . Highest education level: Not on file  Occupational History  . Not on file  Social Needs  . Financial resource strain: Not on file  . Food insecurity:    Worry: Not on file    Inability: Not on file  . Transportation needs:    Medical: Not on file    Non-medical: Not on file  Tobacco Use  . Smoking  status: Current Some Day Smoker    Packs/day: 1.00    Years: 30.00    Pack years: 30.00    Types: Cigarettes  . Smokeless tobacco: Former Systems developer    Quit date: 05/29/2015  Substance and Sexual Activity  . Alcohol use: Yes    Alcohol/week: 0.0 standard drinks    Comment: occasionally  . Drug use: No  . Sexual activity: Not Currently    Birth control/protection: None  Lifestyle  . Physical activity:    Days per week: Not on file    Minutes per session: Not on file  . Stress: Not on file  Relationships  . Social connections:    Talks on phone: Not on file    Gets together: Not on file    Attends religious service: Not on file    Active member of club or organization: Not on file    Attends meetings of clubs or organizations: Not on file    Relationship status: Not on file  . Intimate partner violence:    Fear of current or ex partner: Not on file    Emotionally abused: Not on file    Physically abused: Not on file    Forced sexual activity: Not on file  Other Topics Concern  . 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 albuterol (PROVENTIL HFA;[name] [name]  Marland Kitchen atorvastatin (LIPITOR) 40 MG tablet [name]  . dexamethasone (DECADRON) 4 MG tablet Decadron 4 mg PO BID X 1 week, Decadron 4 mg Daily X 1 week, Decadron 2 mg Daily X 1 week and then STOP.  Marland Kitchen donepezil (ARICEPT) 5 MG tablet [name]  . Fluticasone-Salmeterol (ADVAIR DISKUS) 250-50 MCG/DOSE AEPB Inhale 1 puff into the lungs 2 (two) times daily. Rinse mouth  . ipratropium-albuterol (DUONEB) 0.5-2.5 (3)  MG/3ML SOLN [name] (Patient taking differently: Take 3 mLs by nebulization every 6 (six) hours as needed (for wheezing/shortness of breath). )  . lisinopril (PRINIVIL,[name] [name] (Patient taking differently: Take 20 mg by mouth daily. )  . nicotine (NICODERM CQ - DOSED  IN MG/24 HOURS) 21 mg/24hr patch Place 1 patch (21 mg total) onto the skin daily.  Marland Kitchen tiotropium (SPIRIVA) 18 MCG inhalation capsule Place 1 capsule (18 mcg total) into inhaler and inhale daily.   Allergies  Allergen Reactions  . Prednisone Rash and Other (See Comments)    Arrhythmia, also (Has tolerated hydrocortisone and decadron)    Recent Results (from the past 2160 hour(s))  CUP PACEART REMOTE DEVICE CHECK     Status: None   Collection Time: 10/03/17 10:08 AM  Result Value Ref Range   Date Time Interrogation Session [id number]    Pulse Generator Manufacturer MERM    Pulse Gen Model TMA26 Reveal LINQ    Pulse Gen Serial Number [id number]    Clinic Name [facility]    Implantable Pulse Generator Type ICM/ILR    Implantable Pulse Generator Implant Date [id number]   CUP PACEART REMOTE DEVICE CHECK     Status: None   Collection Time: 11/07/17 10:06 AM  Result Value Ref Range   Date Time Interrogation Session [id number]    Pulse Generator Manufacturer MERM    Pulse Gen Model TDH74 Reveal LINQ    Pulse Gen Serial Number [id number]    Clinic Name [facility]    Implantable Pulse Generator Type ICM/ILR    Implantable Pulse Generator Implant Date [id number]    Eval Rhythm SB   CBC with Differential/Platelet     Status: Abnormal   Collection Time: 11/25/17  8:17 PM  Result Value Ref Range   WBC 12.3 (H) 4.0 - 10.5 K/uL   RBC 4.71 3.87 - 5.11 MIL/uL   Hemoglobin 13.3 12.0 - 15.0 g/dL   HCT 40.7 36.0 - 46.0 %   MCV 86.4 80.0 - 100.0 fL   MCH 28.2 26.0 - 34.0 pg   MCHC 32.7 30.0 - 36.0 g/dL   RDW 14.3 11.5 - 15.5 %    Platelets 296 150 - 400 K/uL   nRBC 0.0 0.0 - 0.2 %   Neutrophils Relative % 82 %   Neutro Abs 10.1 (H) 1.7 - 7.7 K/uL   Lymphocytes Relative 8 %   Lymphs Abs 1.0 0.7 - 4.0 K/uL   Monocytes Relative 10 %   Monocytes Absolute 1.2 (H) 0.1 - 1.0 K/uL   Eosinophils Relative 0 %   Eosinophils Absolute 0.1 0.0 - 0.5 K/uL   Basophils Relative 0 %   Basophils Absolute 0.0 0.0 - 0.1 K/uL   Immature Granulocytes 0 %   Abs Immature Granulocytes 0.05 0.00 - 0.07 K/uL    Comment: Performed at [facility], [address], [place], [place] [address]  Comprehensive metabolic panel     Status: Abnormal   Collection Time: 11/25/17  8:17 PM  Result Value Ref Range   Sodium 138 135 - 145 mmol/L   Potassium 3.9 3.5 - 5.1 mmol/L   Chloride 106 98 - 111 mmol/L   CO2 24 22 - 32 mmol/L   Glucose, Bld 111 (H) 70 - 99 mg/dL   BUN 13 8 - 23 mg/dL   Creatinine, Ser 0.81 0.44 - 1.00 mg/dL   Calcium 8.7 (L) 8.9 - 10.3 mg/dL   Total Protein 7.1 6.5 - 8.1 g/dL   Albumin 3.7 3.5 - 5.0 g/dL   AST 21 15 - 41 U/L   ALT 16 0 - 44 U/L   Alkaline Phosphatase 76 38 - 126 U/L   Total Bilirubin 0.6 0.3 - 1.2 mg/dL   GFR calc non Af Amer >60 >60  mL/min   GFR calc Af Amer >60 >60 mL/min    Comment: (NOTE) The eGFR has been calculated using the CKD EPI equation. This calculation has not been validated in all clinical situations. eGFR's persistently <60 mL/min signify possible Chronic Kidney Disease.    Anion gap 8 5 - 15    Comment: Performed at [facility], [address], [place], [place] [address]  Brain natriuretic peptide     Status: Abnormal   Collection Time: 11/25/17  8:17 PM  Result Value Ref Range   B Natriuretic Peptide 205.0 (H) 0.0 - 100.0 pg/mL    Comment: Performed at [facility], [address] [place], [place] [address]  Troponin I     Status: None   Collection Time: 11/25/17  8:17 PM  Result Value Ref Range   Troponin I <0.03 <0.03 ng/mL    Comment: Performed at [facility], [address], [place], [place] [address]  D-dimer, quantitative (not at [facility])     Status: Abnormal   Collection Time: 11/25/17  8:17 PM  Result Value Ref Range   D-Dimer, Quant 1.42 (H) 0.00 - 0.50 ug/mL-FEU    Comment: (NOTE) At the manufacturer cut-off of 0.50 ug/mL FEU, this assay has been documented to exclude PE with a sensitivity and negative predictive value of 97 to 99%.  At this time, this assay has not been approved by the FDA to exclude DVT/VTE. Results should be correlated with clinical presentation. Performed at [facility], [address], [place], [place] [address]   Comprehensive metabolic panel     Status: Abnormal   Collection Time: 11/26/17  3:15 AM  Result Value Ref Range   Sodium 138 135 - 145 mmol/L   Potassium 3.1 (L) 3.5 - 5.1 mmol/L   Chloride 105 98 - 111 mmol/L   CO2 18 (L) 22 - 32 mmol/L   Glucose, Bld 232 (H) 70 - 99 mg/dL   BUN 14 8 - 23 mg/dL   Creatinine, Ser 1.05 (H) 0.44 - 1.00 mg/dL   Calcium 8.5 (L) 8.9 - 10.3 mg/dL   Total Protein 7.4 6.5 - 8.1 g/dL   Albumin 3.6 3.5 - 5.0 g/dL   AST 33 15 - 41 U/L   ALT 21 0 - 44 U/L   Alkaline Phosphatase 72 38 - 126 U/L   Total Bilirubin 0.5 0.3 - 1.2 mg/dL   GFR calc non Af Amer 54 (L) >60 mL/min   GFR calc Af Amer >60 >60 mL/min    Comment: (NOTE) The eGFR has been calculated using the CKD EPI equation. This calculation has not been validated in all clinical situations. eGFR's persistently <60 mL/min signify possible Chronic Kidney Disease.    Anion gap 15 5 - 15    Comment: Performed at [facility], [address], [place], [place] [address]  CBC     Status: Abnormal   Collection Time: 11/26/17  3:15 AM  Result Value Ref Range   WBC 12.7 (H) 4.0 - 10.5 K/uL   RBC 4.54 3.87 - 5.11 MIL/uL   Hemoglobin 13.0 12.0 - 15.0 g/dL   HCT 41.1 36.0 - 46.0 %   MCV 90.5 80.0 - 100.0 fL   MCH 28.6 26.0 - 34.0 pg   MCHC 31.6 30.0 - 36.0 g/dL   RDW 14.6 11.5 - 15.5 %   Platelets 297 150 - 400 K/uL   nRBC 0.0 0.0 - 0.2 %     Comment: Performed at [facility], [address], [place], [place] [address]  Troponin I (q 6hr x 3)     Status: None   Collection Time: 11/26/17  3:15 AM  Result Value Ref Range   Troponin I <0.03 <0.03 ng/mL    Comment: Performed at [facility], [address], [place], [place] [address]  Troponin I (q 6hr x 3)     Status: None   Collection Time: 11/26/17  7:52 AM  Result Value Ref Range   Troponin I <0.03 <0.03 ng/mL    Comment: Performed at [facility], [address] [place], [place] [address]  Respiratory Panel by PCR     Status: None   Collection Time: 11/26/17 10:07 AM  Result Value Ref Range   Adenovirus NOT DETECTED NOT DETECTED   Coronavirus 229E NOT DETECTED NOT DETECTED   Coronavirus HKU1 NOT DETECTED NOT DETECTED   Coronavirus NL63 NOT DETECTED NOT DETECTED   Coronavirus OC43 NOT DETECTED NOT DETECTED   Metapneumovirus NOT DETECTED NOT DETECTED   Rhinovirus / Enterovirus NOT DETECTED NOT DETECTED   Influenza A NOT DETECTED NOT DETECTED   Influenza B NOT DETECTED NOT DETECTED   Parainfluenza Virus 1 NOT DETECTED NOT DETECTED   Parainfluenza Virus 2 NOT DETECTED NOT DETECTED   Parainfluenza Virus 3 NOT DETECTED NOT DETECTED   Parainfluenza Virus 4 NOT DETECTED NOT DETECTED   Respiratory Syncytial Virus NOT DETECTED NOT DETECTED   Bordetella pertussis NOT DETECTED NOT DETECTED   Chlamydophila pneumoniae NOT DETECTED NOT DETECTED   Mycoplasma pneumoniae NOT DETECTED NOT DETECTED    Comment: Performed at [facility] [address] [place], [place] [address]  Glucose, capillary     Status: Abnormal   Collection Time: 11/26/17  1:45 PM  Result Value Ref Range   Glucose-Capillary 154 (H) 70 - 99 mg/dL   Comment 1 Notify RN    Comment 2 Document in Chart   Troponin I (q 6hr x 3)     Status: None   Collection Time: 11/26/17  2:37 PM  Result Value Ref Range   Troponin I <0.03 <0.03 ng/mL    Comment: Performed at [facility], [address],  [place], [place] [address]  ECHOCARDIOGRAM COMPLETE     Status: None   Collection Time: 11/26/17  2:41 PM  Result Value Ref Range   Weight 3,408 oz   Height 61 in   BP 151/82 mmHg  Glucose, capillary     Status: Abnormal   Collection Time: 11/26/17  4:42 PM  Result Value Ref Range   Glucose-Capillary 183 (H) 70 - 99 mg/dL   Comment 1 Notify RN    Comment 2 Document in Chart   Basic metabolic panel     Status: Abnormal   Collection Time: 11/27/17  6:35 AM  Result Value Ref Range   Sodium 139 135 - 145 mmol/L   Potassium 4.2 3.5 - 5.1 mmol/L    Comment: DELTA CHECK NOTED   Chloride 108 98 - 111 mmol/L   CO2 24 22 - 32 mmol/L   Glucose, Bld 156 (H) 70 - 99 mg/dL   BUN 19 8 - 23 mg/dL   Creatinine, Ser 0.83 0.44 - 1.00 mg/dL   Calcium 8.6 (L) 8.9 - 10.3 mg/dL   GFR calc non Af Amer >60 >60 mL/min   GFR calc Af Amer >60 >60 mL/min    Comment: (NOTE) The eGFR has been calculated using the CKD EPI equation. This calculation has not been validated in all clinical  situations. eGFR's persistently <60 mL/min signify possible Chronic Kidney Disease.    Anion gap 7 5 - 15    Comment: Performed at [facility], [address], [place], [place] [address]  Magnesium     Status: None   Collection Time: 11/27/17  6:35 AM  Result Value Ref Range   Magnesium 2.2 1.7 - 2.4 mg/dL    Comment: Performed at [facility], [address], [place], [place] [address]  Hemoglobin A1c     Status: None   Collection Time: 11/27/17  6:35 AM  Result Value Ref Range   Hgb A1c MFr Bld 5.6 4.8 - 5.6 %    Comment: (NOTE) Pre diabetes:          5.7%-6.4% Diabetes:              >6.4% Glycemic control for   <7.0% adults with diabetes    Mean Plasma Glucose 114.02 mg/dL    Comment: Performed at [facility] [address] [place], [place] [address]  Glucose, capillary     Status: Abnormal   Collection Time: 11/27/17  8:43 AM  Result Value Ref Range   Glucose-Capillary 199 (H) 70 - 99 mg/dL  Glucose,  capillary     Status: Abnormal   Collection Time: 11/27/17 11:24 AM  Result Value Ref Range   Glucose-Capillary 168 (H) 70 - 99 mg/dL  Glucose, capillary     Status: Abnormal   Collection Time: 11/27/17  4:21 PM  Result Value Ref Range   Glucose-Capillary 161 (H) 70 - 99 mg/dL  Glucose, capillary     Status: Abnormal   Collection Time: 11/27/17  9:59 PM  Result Value Ref Range   Glucose-Capillary 151 (H) 70 - 99 mg/dL   Comment 1 Notify RN    Comment 2 Document in Chart   Glucose, capillary     Status: Abnormal   Collection Time: 11/28/17  7:50 AM  Result Value Ref Range   Glucose-Capillary 153 (H) 70 - 99 mg/dL   Comment 1 Notify RN    Comment 2 Document in Chart   Glucose, capillary     Status: Abnormal   Collection Time: 11/28/17 11:12 AM  Result Value Ref Range   Glucose-Capillary 186 (H) 70 - 99 mg/dL   Comment 1 Notify RN    Comment 2 Document in Chart   Brain natriuretic peptide     Status: None   Collection Time: 12/03/17  5:12 PM  Result Value Ref Range   B Natriuretic Peptide 67.0 0.0 - 100.0 pg/mL    Comment: Performed at [facility], [address] [place], [place] [address]  Basic metabolic panel     Status: Abnormal   Collection Time: 12/03/17  5:13 PM  Result Value Ref Range   Sodium 141 135 - 145 mmol/L   Potassium 4.1 3.5 - 5.1 mmol/L   Chloride 100 98 - 111 mmol/L   CO2 30 22 - 32 mmol/L   Glucose, Bld 107 (H) 70 - 99 mg/dL   BUN 22 8 - 23 mg/dL   Creatinine, Ser 0.94 0.44 - 1.00 mg/dL   Calcium 9.0 8.9 - 10.3 mg/dL   GFR calc non Af Amer >60 >60 mL/min   GFR calc Af Amer >60 >60 mL/min    Comment: (NOTE) The eGFR has been calculated using the CKD EPI equation. This calculation has not been validated in all clinical situations. eGFR's persistently <60 mL/min signify possible Chronic Kidney Disease.  Anion gap 11 5 - 15    Comment: Performed at [facility], [address] [place], [place] [address]  Lactic acid, plasma      Status: None   Collection Time: 12/03/17  5:13 PM  Result Value Ref Range   Lactic Acid, Venous 1.7 0.5 - 1.9 mmol/L    Comment: Performed at [facility], [address] [place], [place] [address]  CBC with Differential/Platelet     Status: Abnormal   Collection Time: 12/03/17  5:13 PM  Result Value Ref Range   WBC 17.1 (H) 4.0 - 10.5 K/uL   RBC 5.11 3.87 - 5.11 MIL/uL   Hemoglobin 14.7 12.0 - 15.0 g/dL   HCT 44.8 36.0 - 46.0 %   MCV 87.7 80.0 - 100.0 fL   MCH 28.8 26.0 - 34.0 pg   MCHC 32.8 30.0 - 36.0 g/dL   RDW 14.6 11.5 - 15.5 %   Platelets 385 150 - 400 K/uL   nRBC 0.0 0.0 - 0.2 %   Neutrophils Relative % 80 %   Neutro Abs 13.6 (H) 1.7 - 7.7 K/uL   Lymphocytes Relative 9 %   Lymphs Abs 1.6 0.7 - 4.0 K/uL   Monocytes Relative 9 %   Monocytes Absolute 1.5 (H) 0.1 - 1.0 K/uL   Eosinophils Relative 0 %   Eosinophils Absolute 0.1 0.0 - 0.5 K/uL   Basophils Relative 0 %   Basophils Absolute 0.1 0.0 - 0.1 K/uL   Immature Granulocytes 2 %   Abs Immature Granulocytes 0.29 (H) 0.00 - 0.07 K/uL    Comment: Performed at [facility], [address] [place], [place] [address]  Troponin I     Status: Abnormal   Collection Time: 12/03/17  5:13 PM  Result Value Ref Range   Troponin I 0.13 (HH) <0.03 ng/mL    Comment: CRITICAL RESULT CALLED TO, READ BACK BY AND VERIFIED WITH JESSICA REAVES AT 1847 12/03/17.PMH Performed at [facility], [address] [place], [place] [address]   APTT     Status: None   Collection Time: 12/03/17  5:13 PM  Result Value Ref Range   aPTT 26 24 - 36 seconds    Comment: Performed at [facility], [address] [place], [place] [address]  Protime-INR     Status: None   Collection Time: 12/03/17  5:13 PM  Result Value Ref Range   Prothrombin Time 12.7 11.4 - 15.2 seconds   INR 0.96     Comment: Performed at [facility], [address] [place], [place] [address]  Blood gas, venous     Status:  Abnormal   Collection Time: 12/03/17  5:33 PM  Result Value Ref Range   FIO2 0.28    pH, Ven 7.39 7.250 - 7.430   pCO2, Ven 53 44.0 - 60.0 mmHg   Bicarbonate 32.1 (H) 20.0 - 28.0 mmol/L   Acid-Base Excess 5.5 (H) 0.0 - 2.0 mmol/L   Patient temperature 37.0    Collection site VEIN    Sample type VEIN     Comment: Performed at [facility], [address], [place], [place] [address]  Troponin I     Status: None   Collection Time: 12/03/17  9:49 PM  Result Value Ref Range   Troponin I <0.03 <0.03 ng/mL    Comment: Performed at [facility], [address], [place], [place] [address]  Troponin I     Status: None  Collection Time: 12/04/17  2:57 AM  Result Value Ref Range   Troponin I <0.03 <0.03 ng/mL    Comment: Performed at [facility], [address] [place], [place] [address]  Basic metabolic panel     Status: Abnormal   Collection Time: 12/04/17  2:57 AM  Result Value Ref Range   Sodium 139 135 - 145 mmol/L   Potassium 4.3 3.5 - 5.1 mmol/L   Chloride 103 98 - 111 mmol/L   CO2 25 22 - 32 mmol/L   Glucose, Bld 186 (H) 70 - 99 mg/dL   BUN 20 8 - 23 mg/dL   Creatinine, Ser 0.93 0.44 - 1.00 mg/dL   Calcium 8.6 (L) 8.9 - 10.3 mg/dL   GFR calc non Af Amer >60 >60 mL/min   GFR calc Af Amer >60 >60 mL/min    Comment: (NOTE) The eGFR has been calculated using the CKD EPI equation. This calculation has not been validated in all clinical situations. eGFR's persistently <60 mL/min signify possible Chronic Kidney Disease.    Anion gap 11 5 - 15    Comment: Performed at [facility], [address] [place], [place] [address]  CBC     Status: Abnormal   Collection Time: 12/04/17  2:57 AM  Result Value Ref Range   WBC 20.8 (H) 4.0 - 10.5 K/uL   RBC 4.82 3.87 - 5.11 MIL/uL   Hemoglobin 13.7 12.0 - 15.0 g/dL   HCT 41.6 36.0 - 46.0 %   MCV 86.3 80.0 - 100.0 fL   MCH 28.4 26.0 - 34.0 pg   MCHC 32.9 30.0 - 36.0 g/dL   RDW 14.6 11.5 - 15.5 %    Platelets 314 150 - 400 K/uL   nRBC 0.0 0.0 - 0.2 %    Comment: Performed at [facility], [place], [place] [address]  Heparin level (unfractionated)     Status: None   Collection Time: 12/04/17  2:57 AM  Result Value Ref Range   Heparin Unfractionated 0.55 0.30 - 0.70 IU/mL    Comment: (NOTE) If heparin results are below expected values, and patient dosage has  been confirmed, suggest follow up testing of antithrombin III levels. Performed at [facility], [place], [place] [address]   Heparin level (unfractionated)     Status: Abnormal   Collection Time: 12/04/17  9:14 AM  Result Value Ref Range   Heparin Unfractionated 0.25 (L) 0.30 - 0.70 IU/mL    Comment: (NOTE) If heparin results are below expected values, and patient dosage has  been confirmed, suggest follow up testing of antithrombin III levels. Performed at [facility], [address] [place], [place] [address]   CBC     Status: Abnormal   Collection Time: 12/06/17  4:18 AM  Result Value Ref Range   WBC 19.5 (H) 4.0 - 10.5 K/uL   RBC 4.54 3.87 - 5.11 MIL/uL   Hemoglobin 13.0 12.0 - 15.0 g/dL   HCT 41.1 36.0 - 46.0 %   MCV 90.5 80.0 - 100.0 fL   MCH 28.6 26.0 - 34.0 pg   MCHC 31.6 30.0 - 36.0 g/dL   RDW 15.0 11.5 - 15.5 %   Platelets 321 150 - 400 K/uL   nRBC 0.0 0.0 - 0.2 %    Comment: Performed at [facility], [address] [place],  [address]   Objective  Body mass index is 40.51 kg/m. Wt Readings from Last 3  Encounters:  12/21/17 207 lb 6.4 oz (94.1 kg)  12/08/17 212 lb 6.4 oz (96.3 kg)  11/28/17 220 lb 10.9 oz (100.1 kg)   Temp Readings from Last 3 Encounters:  12/21/17 (!) 97.5 F (36.4 C) (Oral)  12/08/17 (!) 97.4 F (36.3 C) (Oral)  11/28/17 97.7 F (36.5 C) (Oral)   BP Readings from Last 3 Encounters:  12/21/17 92/60  12/08/17 (!) 143/74  11/28/17 140/75   Pulse Readings from Last 3 Encounters:  12/21/17  (!) 55  12/08/17 (!) 52  11/28/17 62    Physical Exam  Constitutional: She is oriented to person, place, and time. Vital signs are normal. She appears well-developed and well-nourished. She is cooperative.  HENT:  Head: Normocephalic and atraumatic.  Mouth/Throat: Oropharynx is clear and moist and mucous membranes are normal.  Eyes: Pupils are equal, round, and reactive to light. Conjunctivae are normal.  Cardiovascular: Normal rate, regular rhythm and normal heart sounds.  Pulmonary/Chest: Effort normal. She has wheezes.  On 3 L O2 today   Neurological: She is alert and oriented to person, place, and time.  Walking with cane today 4/5 strength upper and lower ext b/l today   Skin: Skin is warm, dry and intact.  Psychiatric: She has a normal mood and affect. Her speech is normal and behavior is normal. Judgment and thought content normal. Cognition and memory are normal.  Nursing note and vitals reviewed.   Assessment   1. Copd exacerbation with 2 recently hospital discharges see hpi. H/o tobacco abuse quit 2 cig early 12/2017  2. Physical deconditioning, abnormal gait, weakness despite walking with cane or walker Evaluation for wheelchair, hospital bed, and diapers XL  3. Urinary incontinence  4. Constipation  5. HM Plan   1.  F/u pulm 12/26/17 consider pulm rehab Given sample trelegy to hold advair and spiriva  Rx hospital bed to help with #1 and #2 and elevation of head of bed helps with breathing  Continue O2 on 3L today O2 97% Repeat CBC likely elevated WBC due to iv steroids  Unable to tolerate decadron per daughter    2. H/H PT referral  Given Rx hospital bed, manual wheelchair and diapers XL today  3. See #2  4. Disc otc miralax, senna-colace  5.  Had flu consider repeat for this year  Had pna 23 prevnar due 04/16/2018  utd tdap  Declines shingrix and MMR check   Disc mammo again today pt declines  Referred cologaurd pt has not done reassess at f/u  Declines  derm  S/p partial hysterectomy  DEXA 07/29/15 osteopenia  Labs today lipid, CBC, TSH, vitamin D to f/u   Provider: Dr. Olivia Mackie McLean-Scocuzza-[facility]

## 2017-12-21 NOTE — Patient Instructions (Addendum)
Warm prune juice  Miralax (laxative) or Senna-Colace (laxative and stool softner) Try Trelegy hold advair and spiriva for now   Constipation, Adult Constipation is when a person has fewer bowel movements in a week than normal, has difficulty having a bowel movement, or has stools that are dry, hard, or larger than normal. Constipation may be caused by an underlying condition. It may become worse with age if a person takes certain medicines and does not take in enough fluids. Follow these instructions at home: Eating and drinking   Eat foods that have a lot of fiber, such as fresh fruits and vegetables, whole grains, and beans.  Limit foods that are high in fat, low in fiber, or overly processed, such as french fries, hamburgers, cookies, candies, and soda.  Drink enough fluid to keep your urine clear or pale yellow. General instructions  Exercise regularly or as told by your health care provider.  Go to the restroom when you have the urge to go. Do not hold it in.  Take over-the-counter and prescription medicines only as told by your health care provider. These include any fiber supplements.  Practice pelvic floor retraining exercises, such as deep breathing while relaxing the lower abdomen and pelvic floor relaxation during bowel movements.  Watch your condition for any changes.  Keep all follow-up visits as told by your health care provider. This is important. Contact a health care provider if:  You have pain that gets worse.  You have a fever.  You do not have a bowel movement after 4 days.  You vomit.  You are not hungry.  You lose weight.  You are bleeding from the anus.  You have thin, pencil-like stools. Get help right away if:  You have a fever and your symptoms suddenly get worse.  You leak stool or have blood in your stool.  Your abdomen is bloated.  You have severe pain in your abdomen.  You feel dizzy or you faint. This information is not intended  to replace advice given to you by your health care provider. Make sure you discuss any questions you have with your health care provider. Document Released: 10/17/2003 Document Revised: 08/08/2015 Document Reviewed: 07/09/2015 Elsevier Interactive Patient Education  2018 [employer]  Chronic Obstructive Pulmonary Disease Chronic obstructive pulmonary disease (COPD) is a long-term (chronic) condition that affects the lungs. COPD is a general term that can be used to describe many different lung problems that cause lung swelling (inflammation) and limit airflow, including chronic bronchitis and emphysema. If you have COPD, your lung function will probably never return to normal. In most cases, it gets worse over time. However, there are steps you can take to slow the progression of the disease and improve your quality of life. What are the causes? This condition may be caused by:  Smoking. This is the most common cause.  Certain genes passed down through families.  What increases the risk? The following factors may make you more likely to develop this condition:  Secondhand smoke from cigarettes, pipes, or cigars.  Exposure to chemicals and other irritants such as fumes and dust in the work environment.  Chronic lung conditions or infections.  What are the signs or symptoms? Symptoms of this condition include:  Shortness of breath, especially during physical activity.  Chronic cough with a large amount of thick mucus. Sometimes the cough may not have any mucus (dry cough).  Wheezing.  Rapid breaths.  Wallace CullensGray or bluish discoloration (cyanosis) of the skin, especially  in your fingers, toes, or lips.  Feeling tired (fatigue).  Weight loss.  Chest tightness.  Frequent infections.  Episodes when breathing symptoms become much worse (exacerbations).  Swelling in the ankles, feet, or legs. This may occur in later stages of the disease.  How is this diagnosed? This condition is  diagnosed based on:  Your medical history.  A physical exam.  You may also have tests, including:  Lung (pulmonary) function tests. This may include a spirometry test, which measures your ability to exhale properly.  Chest X-ray.  CT scan.  Blood tests.  How is this treated? This condition may be treated with:  Medicines. These may include inhaled rescue medicines to treat acute exacerbations as well as long-term, or maintenance, medicines to prevent flare-ups of COPD. ? Bronchodilators help treat COPD by dilating the airways to allow increased airflow and make your breathing more comfortable. ? Steroids can reduce airway inflammation and help prevent exacerbations.  Smoking cessation. If you smoke, your health care provider may ask you to quit, and may also recommend therapy or replacement products to help you quit.  Pulmonary rehabilitation. This may involve working with a team of health care providers and specialists, such as respiratory, occupational, and physical therapists.  Exercise and physical activity. These are beneficial for nearly all people with COPD.  Nutrition therapy to gain weight, if you are underweight.  Oxygen. Supplemental oxygen therapy is only helpful if you have a low oxygen level in your blood (hypoxemia).  Lung surgery or transplant.  Palliative care. This is to help people with COPD feel comfortable when treatment is no longer working.  Follow these instructions at home: Medicines  Take over-the-counter and prescription medicines (inhaled or pills) only as told by your health care provider.  Talk to your health care provider before taking any cough or allergy medicines. You may need to avoid certain medicines that dry out your airways. Lifestyle  If you are a smoker, the most important thing that you can do is to stop smoking. Do not use any products that contain nicotine or tobacco, such as cigarettes and e-cigarettes. If you need help  quitting, ask your health care provider. Continuing to smoke will cause the disease to progress faster.  Avoid exposure to things that irritate your lungs, such as smoke, chemicals, and fumes.  Stay active, but balance activity with periods of rest. Exercise and physical activity will help you maintain your ability to do things you want to do.  Learn and use relaxation techniques to manage stress and to control your breathing.  Get the right amount of sleep and get quality sleep. Most adults need 7 or more hours per night.  Eat healthy foods. Eating smaller, more frequent meals and resting before meals may help you maintain your strength. Controlled breathing Learn and use controlled breathing techniques as directed by your health care provider. Controlled breathing techniques include:  Pursed lip breathing. Start by breathing in (inhaling) through your nose for 1 second. Then, purse your lips as if you were going to whistle and breathe out (exhale) through the pursed lips for 2 seconds.  Diaphragmatic breathing. Start by putting one hand on your abdomen just above your waist. Inhale slowly through your nose. The hand on your abdomen should move out. Then purse your lips and exhale slowly. You should be able to feel the hand on your abdomen moving in as you exhale.  Controlled coughing Learn and use controlled coughing to clear mucus from your lungs.  Controlled coughing is a series of short, progressive coughs. The steps of controlled coughing are: 1. Lean your head slightly forward. 2. Breathe in deeply using diaphragmatic breathing. 3. Try to hold your breath for 3 seconds. 4. Keep your mouth slightly open while coughing twice. 5. Spit any mucus out into a tissue. 6. Rest and repeat the steps once or twice as needed.  General instructions  Make sure you receive all the vaccines that your health care provider recommends, especially the pneumococcal and influenza vaccines. Preventing  infection and hospitalization is very important when you have COPD.  Use oxygen therapy and pulmonary rehabilitation if directed to by your health care provider. If you require home oxygen therapy, ask your health care provider whether you should purchase a pulse oximeter to measure your oxygen level at home.  Work with your health care provider to develop a COPD action plan. This will help you know what steps to take if your condition gets worse.  Keep other chronic health conditions under control as told by your health care provider.  Avoid extreme temperature and humidity changes.  Avoid contact with people who have an illness that spreads from person to person (is contagious), such as viral infections or pneumonia.  Keep all follow-up visits as told by your health care provider. This is important. Contact a health care provider if:  You are coughing up more mucus than usual.  There is a change in the color or thickness of your mucus.  Your breathing is more labored than usual.  Your breathing is faster than usual.  You have difficulty sleeping.  You need to use your rescue medicines or inhalers more often than expected.  You have trouble doing routine activities such as getting dressed or walking around the house. Get help right away if:  You have shortness of breath while you are resting.  You have shortness of breath that prevents you from: ? Being able to talk. ? Performing your usual physical activities.  You have chest pain lasting longer than 5 minutes.  Your skin color is more blue (cyanotic) than usual.  You measure low oxygen saturations for longer than 5 minutes with a pulse oximeter.  You have a fever.  You feel too tired to breathe normally. Summary  Chronic obstructive pulmonary disease (COPD) is a long-term (chronic) condition that affects the lungs.  Your lung function will probably never return to normal. In most cases, it gets worse over time.  However, there are steps you can take to slow the progression of the disease and improve your quality of life.  Treatment for COPD may include taking medicines, quitting smoking, pulmonary rehabilitation, and changes to diet and exercise. As the disease progresses, you may need oxygen therapy, a lung transplant, or palliative care.  To help manage your condition, do not smoke, avoid exposure to things that irritate your lungs, stay up to date on all vaccines, and follow your health care provider's instructions for taking medicines. This information is not intended to replace advice given to you by your health care provider. Make sure you discuss any questions you have with your health care provider. Document Released: 10/28/2004 Document Revised: 02/23/2016 Document Reviewed: 02/23/2016 Elsevier Interactive Patient Education  [employer].

## 2017-12-21 NOTE — Progress Notes (Signed)
[facility] Pulmonary Medicine Consultation      Assessment and Plan:  Severe COPD/emphysema, [name], with continued wheezing. Dyspnea on exertion secondary to above. - recently started on Trelegy sample by primary, can continue and see if this helps.  --PPSV 23 04/15/17. Has received flu shot this year.  --PCV13 negative, can consider 1 year from Pneumovax injection. --Will refer to lung cancer screening.   Dyspnea on exertion.  - Continued dyspnea on exertion. - We will refer to pulmonary rehab.  Chronic hypoxic respiratory failure.  - Continue oxygen at 2 L during the day and at night.  Nicotine abuse. - She has recently quit smoking and is congratulated.   Orders Placed This Encounter  Procedures  . AMB referral to pulmonary rehabilitation     Return in about 6 months (around 06/26/2018).   Date: 12/21/2017  MRN# [id number] Lauren Koch Mar 10, 1950   Lauren Koch is a 67 y.o. old female seen in consultation for chief complaint of:    Chief Complaint  Patient presents with  . COPD    follow-up  . Hospitalization Follow-up    COPD exasperation discharged 12/08/17  . Nicotine Dependence    quit 11/25/17  . Shortness of Breath    only with exertion     HPI:  Patient is a 67 year old female with a history of action dependent COPD, including a hospital admission in March 2019 for COPD exacerbation.  At last visit she was advised to use Advair, Spiriva, nebulizer as needed.  She was advised smoking cessation. Since her last visit she was changed to sample by PCP. She has also stopped smoking. She feels that the breathing is doing better, she continues to have dyspnea on exertion. She is using oxygen at 2L at all times including with sleep.    They have 4 dogs, [name], takes tums occasionally but does not necessarily control it.  She is smoking about a ppd, [name], she is  a homebody, does not work outside. When she goes to [employer] with daughter she uses motorized cart.  She is on oxygen at 2L at home but she does not wear it.    Desat walk 06/22/17; baseline sat at rest on RA was 91% and HR 63. Walked 180 feet with moderate dyspnea and audible wheezing. Sat is 93% and HR 82.   **Spirometry 06/22/2017 tracings personally reviewed, [name], [name], results are consistent with severe emphysema.  Imaging personally reviewed, CT chest 04/12/2017, as well as other recent imaging; reduced lung volumes due to obesity; mild bibasilar atelectatic changes, otherwise normal lungs.  Large pulmonary artery suggestive of pulmonary hypertension.  Medication:    Current Outpatient Medications:  .  albuterol (PROVENTIL HFA;[name], [name], [name], Rfl: 2 .  atorvastatin (LIPITOR) 40 MG tablet, [name], [name], Rfl: 1 .  dexamethasone (DECADRON) 4 MG tablet, Decadron 4 mg PO BID X 1 week, Decadron 4 mg Daily X 1 week, Decadron 2 mg Daily X 1 week and then STOP., [name] 30 tablet, Rfl: 0 .  donepezil (ARICEPT) 5 MG tablet, [name], [name]  90 tablet, [name], Inhale 1 puff into the lungs 2 (two) times daily. Rinse mouth, [name], Rfl: 11 .  ipratropium-albuterol (DUONEB) 0.5-2.5 (3) MG/3ML SOLN, [name] (Patient taking differently: Take 3 mLs by nebulization every 6 (six) hours as needed (for wheezing/shortness of breath). ), [name], Rfl: 11 .  lisinopril (PRINIVIL,[name], [name] (Patient taking differently: Take 20 mg by mouth daily. ), [name], Rfl: 1 .  nicotine  (NICODERM CQ - DOSED IN MG/24 HOURS) 21 mg/24hr patch, Place 1 patch (21 mg total) onto the skin daily., [name] 28 patch, Rfl: 0 .  tiotropium (SPIRIVA) 18 MCG inhalation capsule, Place 1 capsule (18 mcg total) into inhaler and inhale daily., [name] 30 capsule, Rfl: 1   Allergies:  Prednisone  Review of Systems:  Constitutional: Feels well. Cardiovascular: Denies chest pain, exertional chest pain.  Pulmonary: Denies hemoptysis, pleuritic chest pain.   The remainder of systems were reviewed and were found to be negative other than what is documented in the HPI.    Physical Examination:   VS: BP 102/70 (BP Location: Left Arm, Cuff Size: Normal)   Pulse 65   Ht 5' (1.524 m)   Wt 205 lb (93 kg)   SpO2 91%   BMI 40.04 kg/m   General Appearance: No distress  Neuro:without focal findings, mental status, speech normal, alert and oriented HEENT: PERRLA, EOM intact Pulmonary: No wheezing, No rales  CardiovascularNormal S1,S2.  No m/r/g.  Abdomen: Benign, Soft, non-tender, No masses Renal:  No costovertebral tenderness  GU:  No performed at this time. Endoc: No evident thyromegaly, no signs of acromegaly or Cushing features Skin:   warm, no rashes, no ecchymosis  Extremities: normal, no cyanosis, clubbing.      LABORATORY PANEL:   CBC Recent Labs  Lab 12/21/17 1045  WBC 4.7  HGB 13.7  HCT 40.7  PLT 253   ------------------------------------------------------------------------------------------------------------------  Chemistries  No results for input(s): NA, K, CL, CO2, GLUCOSE, BUN, CREATININE, CALCIUM, MG, AST, ALT, ALKPHOS, BILITOT in the last 168 hours.  Invalid input(s): GFRCGP ------------------------------------------------------------------------------------------------------------------  Cardiac Enzymes No results for input(s): TROPONINI in the last 168 hours. ------------------------------------------------------------  RADIOLOGY:  No results  found.     Thank  you for the consultation and for allowing [facility] to assist in the care of your patient. Our recommendations are noted above.  Please contact us if we can be of further service.  Wells Guileseep Nickolaos Brallier, M.D., F.C.C.P.  Board Certified in Internal Medicine, Pulmonary Medicine, Critical Care Medicine, and Sleep Medicine.  [facility] Office Number: [phone]  12/21/2017

## 2017-12-22 ENCOUNTER — Other Ambulatory Visit: Payer: Self-pay | Admitting: Internal Medicine

## 2017-12-22 DIAGNOSIS — E785 Hyperlipidemia, unspecified: Secondary | ICD-10-CM

## 2017-12-22 MED ORDER — EZETIMIBE 10 MG PO TABS: 10.0000 mg | ORAL_TABLET | Freq: Every day | ORAL | 3 refills | Status: DC

## 2017-12-26 ENCOUNTER — Ambulatory Visit (INDEPENDENT_AMBULATORY_CARE_PROVIDER_SITE_OTHER): Payer: Medicare Other | Admitting: Internal Medicine

## 2017-12-26 ENCOUNTER — Encounter: Payer: Self-pay | Admitting: Internal Medicine

## 2017-12-26 VITALS — BP 102/70 | HR 65 | Ht 60.0 in | Wt 205.0 lb

## 2017-12-26 DIAGNOSIS — J449 Chronic obstructive pulmonary disease, unspecified: Secondary | ICD-10-CM

## 2017-12-26 DIAGNOSIS — J441 Chronic obstructive pulmonary disease with (acute) exacerbation: Secondary | ICD-10-CM | POA: Diagnosis not present

## 2017-12-26 DIAGNOSIS — R2689 Other abnormalities of gait and mobility: Secondary | ICD-10-CM | POA: Diagnosis not present

## 2017-12-26 DIAGNOSIS — F0390 Unspecified dementia without behavioral disturbance: Secondary | ICD-10-CM | POA: Diagnosis not present

## 2017-12-26 DIAGNOSIS — I110 Hypertensive heart disease with heart failure: Secondary | ICD-10-CM | POA: Diagnosis not present

## 2017-12-26 DIAGNOSIS — R531 Weakness: Secondary | ICD-10-CM | POA: Diagnosis not present

## 2017-12-26 DIAGNOSIS — I639 Cerebral infarction, unspecified: Secondary | ICD-10-CM | POA: Diagnosis not present

## 2017-12-26 DIAGNOSIS — I5032 Chronic diastolic (congestive) heart failure: Secondary | ICD-10-CM | POA: Diagnosis not present

## 2017-12-26 NOTE — Patient Instructions (Addendum)
Continue oxygen.  Continue current inhalers.  Continue to remain a non-smoker.  Will refer to lung cancer screening.

## 2017-12-27 ENCOUNTER — Telehealth: Payer: Self-pay | Admitting: *Deleted

## 2017-12-27 NOTE — Telephone Encounter (Signed)
Received referral for low dose lung cancer screening CT scan. [facility], recommendation is to begin screening at 3 month interval from CT angio chest that was done at the end of October. Patient verbalizes understanding.

## 2018-01-02 DIAGNOSIS — I5032 Chronic diastolic (congestive) heart failure: Secondary | ICD-10-CM | POA: Diagnosis not present

## 2018-01-02 DIAGNOSIS — J441 Chronic obstructive pulmonary disease with (acute) exacerbation: Secondary | ICD-10-CM | POA: Diagnosis not present

## 2018-01-02 DIAGNOSIS — F0390 Unspecified dementia without behavioral disturbance: Secondary | ICD-10-CM | POA: Diagnosis not present

## 2018-01-02 DIAGNOSIS — I110 Hypertensive heart disease with heart failure: Secondary | ICD-10-CM | POA: Diagnosis not present

## 2018-01-02 DIAGNOSIS — R2689 Other abnormalities of gait and mobility: Secondary | ICD-10-CM | POA: Diagnosis not present

## 2018-01-02 DIAGNOSIS — R531 Weakness: Secondary | ICD-10-CM | POA: Diagnosis not present

## 2018-01-04 ENCOUNTER — Telehealth: Payer: Self-pay | Admitting: Internal Medicine

## 2018-01-04 ENCOUNTER — Other Ambulatory Visit: Payer: Self-pay | Admitting: Internal Medicine

## 2018-01-04 DIAGNOSIS — F0390 Unspecified dementia without behavioral disturbance: Secondary | ICD-10-CM | POA: Diagnosis not present

## 2018-01-04 DIAGNOSIS — J449 Chronic obstructive pulmonary disease, unspecified: Secondary | ICD-10-CM

## 2018-01-04 DIAGNOSIS — I5032 Chronic diastolic (congestive) heart failure: Secondary | ICD-10-CM | POA: Diagnosis not present

## 2018-01-04 DIAGNOSIS — J441 Chronic obstructive pulmonary disease with (acute) exacerbation: Secondary | ICD-10-CM | POA: Diagnosis not present

## 2018-01-04 DIAGNOSIS — R531 Weakness: Secondary | ICD-10-CM | POA: Diagnosis not present

## 2018-01-04 DIAGNOSIS — I110 Hypertensive heart disease with heart failure: Secondary | ICD-10-CM | POA: Diagnosis not present

## 2018-01-04 DIAGNOSIS — R2689 Other abnormalities of gait and mobility: Secondary | ICD-10-CM | POA: Diagnosis not present

## 2018-01-04 MED ORDER — FLUTICASONE-UMECLIDIN-VILANT 100-62.5-25 MCG/INH IN AEPB: 1.0000 | INHALATION_SPRAY | Freq: Every day | RESPIRATORY_TRACT | 12 refills | Status: DC

## 2018-01-04 NOTE — Telephone Encounter (Signed)
Copied from CRM [phone]. Topic: Quick Communication - See Telephone Encounter >> Jan 04, 2018  9:02 AM Fanny BienIlderton, Jessica L wrote: CRM for notification. See Telephone encounter for: 01/04/18. Pt daughter called and stated that patient liked the sample of trelegy and would like an RX sent in. Please advise.Cb#[phone]

## 2018-01-09 DIAGNOSIS — R2689 Other abnormalities of gait and mobility: Secondary | ICD-10-CM | POA: Diagnosis not present

## 2018-01-09 DIAGNOSIS — J441 Chronic obstructive pulmonary disease with (acute) exacerbation: Secondary | ICD-10-CM | POA: Diagnosis not present

## 2018-01-09 DIAGNOSIS — I110 Hypertensive heart disease with heart failure: Secondary | ICD-10-CM | POA: Diagnosis not present

## 2018-01-09 DIAGNOSIS — R531 Weakness: Secondary | ICD-10-CM | POA: Diagnosis not present

## 2018-01-09 DIAGNOSIS — F0390 Unspecified dementia without behavioral disturbance: Secondary | ICD-10-CM | POA: Diagnosis not present

## 2018-01-09 DIAGNOSIS — I5032 Chronic diastolic (congestive) heart failure: Secondary | ICD-10-CM | POA: Diagnosis not present

## 2018-01-10 ENCOUNTER — Ambulatory Visit: Payer: Medicare Other

## 2018-01-10 ENCOUNTER — Ambulatory Visit (INDEPENDENT_AMBULATORY_CARE_PROVIDER_SITE_OTHER): Payer: Medicare Other

## 2018-01-10 DIAGNOSIS — R55 Syncope and collapse: Secondary | ICD-10-CM

## 2018-01-10 DIAGNOSIS — I639 Cerebral infarction, unspecified: Secondary | ICD-10-CM

## 2018-01-11 DIAGNOSIS — I110 Hypertensive heart disease with heart failure: Secondary | ICD-10-CM | POA: Diagnosis not present

## 2018-01-11 DIAGNOSIS — I5032 Chronic diastolic (congestive) heart failure: Secondary | ICD-10-CM | POA: Diagnosis not present

## 2018-01-11 DIAGNOSIS — F0390 Unspecified dementia without behavioral disturbance: Secondary | ICD-10-CM | POA: Diagnosis not present

## 2018-01-11 DIAGNOSIS — R531 Weakness: Secondary | ICD-10-CM | POA: Diagnosis not present

## 2018-01-11 DIAGNOSIS — R2689 Other abnormalities of gait and mobility: Secondary | ICD-10-CM | POA: Diagnosis not present

## 2018-01-11 DIAGNOSIS — J441 Chronic obstructive pulmonary disease with (acute) exacerbation: Secondary | ICD-10-CM | POA: Diagnosis not present

## 2018-01-12 NOTE — Progress Notes (Signed)
Carelink Summary Report / Loop Recorder 

## 2018-01-16 NOTE — Progress Notes (Deleted)
Cardiology Office Note Date:  01/16/2018  Patient ID:  Lauren, Koch Feb 06, 1950, MRN [id number] PCP:  McLean-Scocuzza, Nino Glow, MD  Electrophysiologist:  Dr. Lovena Le  ***refresh   Chief Complaint: post hospital f/u  History of Present Illness: Lauren Koch is a 67 y.o. female with history of COPD, ongoing smoker, O2 dependent, asthma, HTN, and stroke 03/20/16 that she has ILR implanted for.  She was has had 2 hospitalizations recently for COPD exacerbation.  The lst 12/03/17 with mild trop elevation and evaluated by cardiology. She had nonspecific EKG findings, CT r/o PE.  She was not felt to have ACS, and no further cariac testing recommended.  Noted:"Considering her overall poor health status, O2 dependence, morbid obesity, and multiple medical problems, I do not think she is a candidate for invasive cardiac evaluation and would not recommend stress testing because of limited accuracy with her body habitus"  *** symptoms *** meds *** labs, lipids *** AF to date>?  Device infromation: MDT ILR implanted 03/23/16, cryptogenic stroke  Past Medical History:  Diagnosis Date  . Asthma   . Cataract   . COPD (chronic obstructive pulmonary disease) ([place])   . Cryptogenic Stroke ([place])    a. 03/2016 L ACA, L MCX, L PCA, and R dorsal pontine territory infarcts; b. 03/2016 Carotid U/S: 1-39% bil dzs; c. 03/2016 TEE: EF 60-65%, no rwma, no LA/LAA/RA/RAA thrombus. No PFO; d. 03/2016 s/p MDT Reveal Linq (ser# [id number]) - no AFib noted to date (some sinus w/ freq PACs).  . Dementia ([place])   . Diastolic dysfunction    a. 03/2016 Echo: EF 60-65%, gr1 DD, nl RV fxn.  Marland Kitchen GERD (gastroesophageal reflux disease)   . Hypertension   . Smoker   . Vitamin D deficiency     Past Surgical History:  Procedure Laterality Date  . ABDOMINAL HYSTERECTOMY     partial   . CESAREAN SECTION  1971/1976  . HERNIA REPAIR  2013  . LOOP RECORDER INSERTION N/A 03/23/2016   Procedure: Loop Recorder Insertion;   Surgeon: Evans Lance, MD;  Location: [facility];  Service: Cardiovascular;  [name];  . PARTIAL HYMENECTOMY    . TEE WITHOUT CARDIOVERSION N/A 03/23/2016   Procedure: TRANSESOPHAGEAL ECHOCARDIOGRAM (TEE);  Surgeon: Skeet Latch, MD;  Location: [facility] ENDOSCOPY;  Service: Cardiovascular;  [name];    Current Outpatient Medications  Medication Sig Dispense Refill  . albuterol (PROVENTIL HFA;[name] [name] (Patient not taking: Reported on 12/26/2017) 1 Inhaler 2  . atorvastatin (LIPITOR) 40 MG tablet [name] 90 tablet 1  . Cholecalciferol 1.25 MG (50000 UT) capsule Take 1 capsule (50,000 Units total) by mouth once a week. 13 capsule 1  . dexamethasone (DECADRON) 4 MG tablet Decadron 4 mg PO BID X 1 week, Decadron 4 mg Daily X 1 week, Decadron 2 mg Daily X 1 week and then STOP. 30 tablet 0  . donepezil (ARICEPT) 5 MG tablet [name] 90 tablet 1  . ezetimibe (ZETIA) 10 MG tablet Take 1 tablet (10 mg total) by mouth daily. 90 tablet 3  . Fluticasone-Umeclidin-Vilant (TRELEGY ELLIPTA) 100-62.5-25 MCG/INH AEPB Inhale 1 puff into the lungs daily. Rinse mouth 60 each 12  . ipratropium-albuterol (DUONEB) 0.5-2.5 (3) MG/3ML SOLN Take 3 mLs  by nebulization every 6 (six) hours. (Patient taking differently: Take 3 mLs by nebulization every 6 (six) hours as needed (for wheezing/shortness of breath). ) 360 mL 11  . lisinopril (PRINIVIL,[name] [name] (Patient taking differently: Take 20 mg by mouth daily. ) 90 tablet 1  . nicotine (NICODERM CQ - DOSED IN MG/24 HOURS) 21 mg/24hr patch Place 1 patch (21 mg total) onto the skin daily. 28 patch 0   No current facility-administered medications for this visit.     Allergies:   Prednisone    Social History:  The patient  reports that she quit smoking about 7 weeks ago. Her smoking use included cigarettes. She has a 30.00 pack-year smoking history. She quit smokeless tobacco use about 2 years ago. She reports current alcohol use. She reports that she does not use drugs.   Family History:  The patient's family history includes Cancer in her sister; Diabetes in her father, mother, and another family member; Heart disease in her father; Hyperlipidemia in her daughter, father, and mother; Hypertension in her father and mother; Miscarriages / [place] in her mother; Stroke in her father.  ROS:  Please see the history of present illness.  All other systems are reviewed and otherwise negative.   PHYSICAL EXAM: *** VS:  There were no vitals taken for this visit. BMI: There is no height or weight on file to calculate BMI. Well nourished, well developed, in no acute distress  HEENT: normocephalic, atraumatic  Neck: no JVD, carotid bruits or masses Cardiac:  *** RRR; no significant murmurs, no rubs, or gallops Lungs:  *** cta B/L, no wheezing, rhonchi or rales  Abd: soft, nontender MS: no deformity or *** atrophy Ext: *** no edema  Skin: warm and dry, no rash Neuro:  No gross deficits appreciated Psych: euthymic mood, full affect  *** ILR site is stable, no tethering or discomfort   EKG:  Not done today ILR interrogation done today and reviewed by myself: ***  11/26/17: TTE Study Conclusions - Left ventricle: The cavity size was normal. Wall thickness was   increased in a pattern of mild LVH. Systolic function was normal.   The estimated ejection fraction was in the range of 60% to 65%.   Wall motion was normal; there were no regional wall motion   abnormalities. Doppler parameters are consistent with abnormal   left ventricular relaxation (grade 1 diastolic dysfunction). - Aortic valve: Valve area (VTI): 2.4 cm^2. Valve area (Vmax): 2.29   cm^2. Valve area (Vmean): 2.58  cm^2. - Left atrium: The atrium was mildly dilated   Recent Labs: 11/26/2017: ALT 21 11/27/2017: Magnesium 2.2 12/03/2017: B Natriuretic Peptide 67.0 12/04/2017: BUN 20; Creatinine, Ser 0.93; Potassium 4.3; Sodium 139 12/21/2017: Hemoglobin 13.7; Platelets 253; TSH 1.65  12/21/2017: Cholesterol 208; HDL 59.10; LDL Cholesterol 126; Total CHOL/HDL Ratio 4; Triglycerides 116.0; VLDL 23.2   CrCl cannot be calculated (Patient's most recent lab result is older than the maximum 21 days allowed.).   Wt Readings from Last 3 Encounters:  12/26/17 205 lb (93 kg)  12/21/17 207 lb 6.4 oz (94.1 kg)  12/08/17 212 lb 6.4 oz (96.3 kg)     Other studies reviewed: Additional studies/records reviewed today include: summarized above  ASSESSMENT AND PLAN:  1. Cryptogenic stroke w.ILR      ***  2. HTN     ***  Disposition: F/u with ***  Current medicines are reviewed at length with the patient today.  The patient did not have any concerns regarding medicines.  Venetia Night, PA-C 01/16/2018 3:49 PM     [address] [place] [address] [phone] (office)  [phone] (fax)

## 2018-01-18 ENCOUNTER — Ambulatory Visit: Payer: Medicare Other | Admitting: Physician Assistant

## 2018-01-18 DIAGNOSIS — R531 Weakness: Secondary | ICD-10-CM | POA: Diagnosis not present

## 2018-01-18 DIAGNOSIS — J441 Chronic obstructive pulmonary disease with (acute) exacerbation: Secondary | ICD-10-CM | POA: Diagnosis not present

## 2018-01-18 DIAGNOSIS — I5032 Chronic diastolic (congestive) heart failure: Secondary | ICD-10-CM | POA: Diagnosis not present

## 2018-01-18 DIAGNOSIS — I110 Hypertensive heart disease with heart failure: Secondary | ICD-10-CM | POA: Diagnosis not present

## 2018-01-18 DIAGNOSIS — F0390 Unspecified dementia without behavioral disturbance: Secondary | ICD-10-CM | POA: Diagnosis not present

## 2018-01-18 DIAGNOSIS — R2689 Other abnormalities of gait and mobility: Secondary | ICD-10-CM | POA: Diagnosis not present

## 2018-01-23 DIAGNOSIS — R531 Weakness: Secondary | ICD-10-CM | POA: Diagnosis not present

## 2018-01-23 DIAGNOSIS — J441 Chronic obstructive pulmonary disease with (acute) exacerbation: Secondary | ICD-10-CM | POA: Diagnosis not present

## 2018-01-23 DIAGNOSIS — I110 Hypertensive heart disease with heart failure: Secondary | ICD-10-CM | POA: Diagnosis not present

## 2018-01-23 DIAGNOSIS — F0390 Unspecified dementia without behavioral disturbance: Secondary | ICD-10-CM | POA: Diagnosis not present

## 2018-01-23 DIAGNOSIS — I5032 Chronic diastolic (congestive) heart failure: Secondary | ICD-10-CM | POA: Diagnosis not present

## 2018-01-23 DIAGNOSIS — R2689 Other abnormalities of gait and mobility: Secondary | ICD-10-CM | POA: Diagnosis not present

## 2018-01-28 LAB — CUP PACEART REMOTE DEVICE CHECK
Date Time Interrogation Session: 20191107104059
MDC IDC PG IMPLANT DT: 20180220

## 2018-01-30 ENCOUNTER — Telehealth: Payer: Self-pay | Admitting: *Deleted

## 2018-01-30 DIAGNOSIS — Z87891 Personal history of nicotine dependence: Secondary | ICD-10-CM

## 2018-01-30 DIAGNOSIS — Z122 Encounter for screening for malignant neoplasm of respiratory organs: Secondary | ICD-10-CM

## 2018-01-30 NOTE — Telephone Encounter (Signed)
Received referral for initial lung cancer screening scan. Contacted patient and obtained smoking history,(former, quit 12/02/17, 48 pack year) as well as answering questions related to screening process. Patient denies signs of lung cancer such as weight loss or hemoptysis. Patient denies comorbidity that would prevent curative treatment if lung cancer were found. Patient is scheduled for shared decision making visit and CT scan on 02/28/18 at 115pm.

## 2018-02-13 ENCOUNTER — Ambulatory Visit (INDEPENDENT_AMBULATORY_CARE_PROVIDER_SITE_OTHER): Payer: Medicare Other

## 2018-02-13 DIAGNOSIS — R55 Syncope and collapse: Secondary | ICD-10-CM | POA: Diagnosis not present

## 2018-02-14 LAB — CUP PACEART REMOTE DEVICE CHECK
Implantable Pulse Generator Implant Date: 20180220
MDC IDC SESS DTM: 20200112110551

## 2018-02-14 NOTE — Progress Notes (Signed)
Carelink Summary Report / Loop Recorder 

## 2018-02-19 LAB — CUP PACEART REMOTE DEVICE CHECK
Date Time Interrogation Session: 20191210103601
MDC IDC PG IMPLANT DT: 20180220

## 2018-02-27 ENCOUNTER — Telehealth: Payer: Self-pay | Admitting: *Deleted

## 2018-02-27 ENCOUNTER — Encounter: Payer: Self-pay | Admitting: Oncology

## 2018-02-27 NOTE — Telephone Encounter (Signed)
Called pt to remind her of her appt for ldct screening on 02-28-2018 @ 1315, voiced understanding.

## 2018-02-28 ENCOUNTER — Ambulatory Visit: Admission: RE | Admit: 2018-02-28 | Payer: Medicare Other | Source: Ambulatory Visit

## 2018-02-28 ENCOUNTER — Inpatient Hospital Stay: Payer: Medicare Other | Attending: Oncology | Admitting: Hospice and Palliative Medicine

## 2018-03-03 ENCOUNTER — Telehealth: Payer: Self-pay | Admitting: *Deleted

## 2018-03-03 NOTE — Telephone Encounter (Signed)
Received referral for low dose lung cancer screening CT scan. Message left at phone number listed in EMR for patient to call me back to facilitate scheduling scan. Note, patient had prior no show for lung screening.

## 2018-03-07 ENCOUNTER — Telehealth: Payer: Self-pay | Admitting: *Deleted

## 2018-03-07 NOTE — Telephone Encounter (Signed)
Attempted to contact patient to reschedule lung screening scan that patient had prior no show for. Phone is hung up once answered. No option exists for voicemail.

## 2018-03-08 ENCOUNTER — Encounter: Payer: Self-pay | Admitting: *Deleted

## 2018-03-17 ENCOUNTER — Ambulatory Visit (INDEPENDENT_AMBULATORY_CARE_PROVIDER_SITE_OTHER): Payer: Medicare Other

## 2018-03-17 DIAGNOSIS — I639 Cerebral infarction, unspecified: Secondary | ICD-10-CM

## 2018-03-17 LAB — CUP PACEART REMOTE DEVICE CHECK
Date Time Interrogation Session: 20200214100052
MDC IDC PG IMPLANT DT: 20180220

## 2018-03-28 NOTE — Progress Notes (Signed)
Carelink Summary Report / Loop Recorder 

## 2018-04-03 ENCOUNTER — Encounter: Payer: Self-pay | Admitting: *Deleted

## 2018-04-03 ENCOUNTER — Emergency Department: Payer: Medicare Other

## 2018-04-03 ENCOUNTER — Inpatient Hospital Stay
Admission: EM | Admit: 2018-04-03 | Discharge: 2018-04-05 | DRG: 190 | Disposition: A | Discharge status: Home / Self Care | Payer: Medicare Other | Attending: Internal Medicine | Admitting: Internal Medicine

## 2018-04-03 DIAGNOSIS — Z8673 Personal history of transient ischemic attack (TIA), and cerebral infarction without residual deficits: Secondary | ICD-10-CM | POA: Diagnosis not present

## 2018-04-03 DIAGNOSIS — Z809 Family history of malignant neoplasm, unspecified: Secondary | ICD-10-CM

## 2018-04-03 DIAGNOSIS — Z9981 Dependence on supplemental oxygen: Secondary | ICD-10-CM | POA: Diagnosis not present

## 2018-04-03 DIAGNOSIS — K219 Gastro-esophageal reflux disease without esophagitis: Secondary | ICD-10-CM | POA: Diagnosis present

## 2018-04-03 DIAGNOSIS — R0602 Shortness of breath: Secondary | ICD-10-CM | POA: Diagnosis not present

## 2018-04-03 DIAGNOSIS — J209 Acute bronchitis, unspecified: Secondary | ICD-10-CM | POA: Diagnosis present

## 2018-04-03 DIAGNOSIS — Z888 Allergy status to other drugs, medicaments and biological substances status: Secondary | ICD-10-CM | POA: Diagnosis not present

## 2018-04-03 DIAGNOSIS — J181 Lobar pneumonia, unspecified organism: Secondary | ICD-10-CM

## 2018-04-03 DIAGNOSIS — Z87891 Personal history of nicotine dependence: Secondary | ICD-10-CM | POA: Diagnosis not present

## 2018-04-03 DIAGNOSIS — J440 Chronic obstructive pulmonary disease with acute lower respiratory infection: Secondary | ICD-10-CM | POA: Diagnosis present

## 2018-04-03 DIAGNOSIS — Z8349 Family history of other endocrine, nutritional and metabolic diseases: Secondary | ICD-10-CM | POA: Diagnosis not present

## 2018-04-03 DIAGNOSIS — J441 Chronic obstructive pulmonary disease with (acute) exacerbation: Secondary | ICD-10-CM | POA: Diagnosis not present

## 2018-04-03 DIAGNOSIS — Z823 Family history of stroke: Secondary | ICD-10-CM

## 2018-04-03 DIAGNOSIS — J189 Pneumonia, unspecified organism: Secondary | ICD-10-CM | POA: Diagnosis not present

## 2018-04-03 DIAGNOSIS — F0390 Unspecified dementia without behavioral disturbance: Secondary | ICD-10-CM | POA: Diagnosis present

## 2018-04-03 DIAGNOSIS — Z8249 Family history of ischemic heart disease and other diseases of the circulatory system: Secondary | ICD-10-CM

## 2018-04-03 DIAGNOSIS — I10 Essential (primary) hypertension: Secondary | ICD-10-CM | POA: Diagnosis present

## 2018-04-03 DIAGNOSIS — R05 Cough: Secondary | ICD-10-CM | POA: Diagnosis not present

## 2018-04-03 DIAGNOSIS — Z833 Family history of diabetes mellitus: Secondary | ICD-10-CM | POA: Diagnosis not present

## 2018-04-03 LAB — COMPREHENSIVE METABOLIC PANEL
ALT: 15 U/L (ref 0–44)
AST: 23 U/L (ref 15–41)
Albumin: 3.7 g/dL (ref 3.5–5.0)
Alkaline Phosphatase: 69 U/L (ref 38–126)
Anion gap: 9 (ref 5–15)
BUN: 16 mg/dL (ref 8–23)
CO2: 24 mmol/L (ref 22–32)
Calcium: 8.6 mg/dL — ABNORMAL LOW (ref 8.9–10.3)
Chloride: 106 mmol/L (ref 98–111)
Creatinine, Ser: 0.88 mg/dL (ref 0.44–1.00)
GFR calc Af Amer: >60 mL/min (ref >60)
GFR calc non Af Amer: >60 mL/min (ref >60)
Glucose, Bld: 98 mg/dL (ref 70–99)
POTASSIUM: 4 mmol/L (ref 3.5–5.1)
SODIUM: 139 mmol/L (ref 135–145)
Total Bilirubin: 0.2 mg/dL — ABNORMAL LOW (ref 0.3–1.2)
Total Protein: 7.1 g/dL (ref 6.5–8.1)

## 2018-04-03 LAB — CBC
HCT: 39.8 % (ref 36.0–46.0)
Hemoglobin: 12.9 g/dL (ref 12.0–15.0)
MCH: 28.4 pg (ref 26.0–34.0)
MCHC: 32.4 g/dL (ref 30.0–36.0)
MCV: 87.7 fL (ref 80.0–100.0)
Platelets: 282 10*3/uL (ref 150–400)
RBC: 4.54 MIL/uL (ref 3.87–5.11)
RDW: 12.9 % (ref 11.5–15.5)
WBC: 6.5 10*3/uL (ref 4.0–10.5)
nRBC: 0 % (ref 0.0–0.2)

## 2018-04-03 LAB — GROUP A STREP BY PCR: GROUP A STREP BY PCR: NOT DETECTED

## 2018-04-03 MED ORDER — ONDANSETRON HCL 4 MG/2ML IJ SOLN: 4.0000 mg | Freq: Four times a day (QID) | INTRAMUSCULAR | Status: DC | PRN

## 2018-04-03 MED ORDER — BISACODYL 5 MG PO TBEC: 5.0000 mg | DELAYED_RELEASE_TABLET | Freq: Every day | ORAL | Status: DC | PRN

## 2018-04-03 MED ORDER — SODIUM CHLORIDE 0.9 % IV SOLN
1.0000 g | Freq: Once | INTRAVENOUS | Status: AC
  Administered 2018-04-03: 1 g via INTRAVENOUS
  Filled 2018-04-03 (×2): qty 10

## 2018-04-03 MED ORDER — IPRATROPIUM-ALBUTEROL 0.5-2.5 (3) MG/3ML IN SOLN
3.0000 mL | Freq: Once | RESPIRATORY_TRACT | Status: AC
  Administered 2018-04-03: 3 mL via RESPIRATORY_TRACT
  Filled 2018-04-03: qty 3

## 2018-04-03 MED ORDER — ONDANSETRON HCL 4 MG PO TABS: 4.0000 mg | ORAL_TABLET | Freq: Four times a day (QID) | ORAL | Status: DC | PRN

## 2018-04-03 MED ORDER — GUAIFENESIN ER 600 MG PO TB12
600.0000 mg | ORAL_TABLET | Freq: Two times a day (BID) | ORAL | Status: DC
  Administered 2018-04-03 – 2018-04-05 (×4): 600 mg via ORAL
  Filled 2018-04-03 (×4): qty 1

## 2018-04-03 MED ORDER — ACETAMINOPHEN 325 MG PO TABS: 650.0000 mg | ORAL_TABLET | Freq: Four times a day (QID) | ORAL | Status: DC | PRN

## 2018-04-03 MED ORDER — IPRATROPIUM BROMIDE 0.02 % IN SOLN
0.5000 mg | Freq: Four times a day (QID) | RESPIRATORY_TRACT | Status: DC
  Administered 2018-04-04 (×2): 0.5 mg via RESPIRATORY_TRACT
  Filled 2018-04-03 (×2): qty 2.5

## 2018-04-03 MED ORDER — SODIUM CHLORIDE 0.9 % IV SOLN
1.0000 g | INTRAVENOUS | Status: DC
  Filled 2018-04-03: qty 10

## 2018-04-03 MED ORDER — BUDESONIDE 0.25 MG/2ML IN SUSP
0.2500 mg | Freq: Two times a day (BID) | RESPIRATORY_TRACT | Status: DC
  Administered 2018-04-04 – 2018-04-05 (×3): 0.25 mg via RESPIRATORY_TRACT
  Filled 2018-04-03 (×3): qty 2

## 2018-04-03 MED ORDER — HYDRALAZINE HCL 20 MG/ML IJ SOLN: 5.0000 mg | Freq: Four times a day (QID) | INTRAMUSCULAR | Status: DC | PRN

## 2018-04-03 MED ORDER — METHYLPREDNISOLONE SODIUM SUCC 40 MG IJ SOLR
40.0000 mg | Freq: Two times a day (BID) | INTRAMUSCULAR | Status: DC
  Administered 2018-04-04 – 2018-04-05 (×2): 40 mg via INTRAVENOUS
  Filled 2018-04-03 (×2): qty 1

## 2018-04-03 MED ORDER — SODIUM CHLORIDE 0.9 % IV SOLN
500.0000 mg | Freq: Once | INTRAVENOUS | Status: AC
  Administered 2018-04-03: 500 mg via INTRAVENOUS
  Filled 2018-04-03: qty 500

## 2018-04-03 MED ORDER — ENOXAPARIN SODIUM 40 MG/0.4ML ~~LOC~~ SOLN
40.0000 mg | SUBCUTANEOUS | Status: DC
  Administered 2018-04-03 – 2018-04-04 (×2): 40 mg via SUBCUTANEOUS
  Filled 2018-04-03 (×2): qty 0.4

## 2018-04-03 MED ORDER — SODIUM CHLORIDE 0.9% FLUSH
3.0000 mL | Freq: Two times a day (BID) | INTRAVENOUS | Status: DC
  Administered 2018-04-04 – 2018-04-05 (×4): 3 mL via INTRAVENOUS

## 2018-04-03 MED ORDER — SODIUM CHLORIDE 0.9 % IV SOLN
500.0000 mg | INTRAVENOUS | Status: DC
  Filled 2018-04-03: qty 500

## 2018-04-03 MED ORDER — METHYLPREDNISOLONE SODIUM SUCC 125 MG IJ SOLR
125.0000 mg | Freq: Once | INTRAMUSCULAR | Status: AC
  Administered 2018-04-03: 125 mg via INTRAVENOUS
  Filled 2018-04-03: qty 2

## 2018-04-03 MED ORDER — ACETAMINOPHEN 650 MG RE SUPP: 650.0000 mg | Freq: Four times a day (QID) | RECTAL | Status: DC | PRN

## 2018-04-03 MED ORDER — ALBUTEROL SULFATE (2.5 MG/3ML) 0.083% IN NEBU
2.5000 mg | INHALATION_SOLUTION | Freq: Four times a day (QID) | RESPIRATORY_TRACT | Status: DC
  Administered 2018-04-04 (×2): 2.5 mg via RESPIRATORY_TRACT
  Filled 2018-04-03 (×2): qty 3

## 2018-04-03 MED ORDER — ALBUTEROL SULFATE (2.5 MG/3ML) 0.083% IN NEBU: 2.5000 mg | INHALATION_SOLUTION | RESPIRATORY_TRACT | Status: DC | PRN

## 2018-04-03 NOTE — ED Triage Notes (Signed)
Pt is here for cough and sore throat for 2 days.  Pt also states that she has been having some swelling in her legs which she states that she noted beginning yesterday.  No CP or sob with this.

## 2018-04-03 NOTE — ED Notes (Signed)
.. ED TO INPATIENT HANDOFF REPORT  ED Nurse Name and Phone #: Pattricia Boss [phone]  S Name/Age/Gender Lauren Koch 68 y.o. female Room/Bed: ED31A/ED31A  Code Status   Code Status: Prior  Home/SNF/Other Home Patient oriented to: self, place, time and situation Is this baseline? Yes   Triage Complete: Triage complete  Chief Complaint swollen legs  Triage Note Pt is here for cough and sore throat for 2 days.  Pt also states that she has been having some swelling in her legs which she states that she noted beginning yesterday.  No CP or sob with this.     Allergies Allergies  Allergen Reactions  . Prednisone Rash and Other (See Comments)    Arrhythmia, also (Has tolerated hydrocortisone and decadron)     Level of Care/Admitting Diagnosis ED Disposition    ED Disposition Condition Comment   Admit  [facility]ospital [facility] [[id number]]  Level of Care: Med-Surg [16]  Diagnosis: COPD (chronic obstructive pulmonary disease) with acute bronchitis [facility]) [[id number]]  Admitting Physician: Montez Morita [[id number]]  Attending Physician: Montez Morita [[id number]]  Estimated length of stay: 3 - 4 days  Certification:: I certify this patient will need inpatient services for at least 2 midnights  PT Class (Do Not Modify): Inpatient [101]  PT Acc Code (Do Not Modify): Private [1]       B Medical/Surgery History Past Medical History:  Diagnosis Date  . Asthma   . Cataract   . COPD (chronic obstructive pulmonary disease) ([facility])   . Cryptogenic Stroke ([facility])    a. 03/2016 L ACA, L MCX, L PCA, and R dorsal pontine territory infarcts; b. 03/2016 Carotid U/S: 1-39% bil dzs; c. 03/2016 TEE: EF 60-65%, no rwma, no LA/LAA/RA/RAA thrombus. No PFO; d. 03/2016 s/p MDT Reveal Linq (ser# [id number]) - no AFib noted to date (some sinus w/ freq PACs).  . Dementia ([facility])   . Diastolic dysfunction    a. 03/2016 Echo: EF 60-65%, gr1 DD, nl RV fxn.  Marland Kitchen GERD (gastroesophageal reflux disease)    . Hypertension   . Smoker   . Vitamin D deficiency    Past Surgical History:  Procedure Laterality Date  . ABDOMINAL HYSTERECTOMY     partial   . CESAREAN SECTION  1971/1976  . HERNIA REPAIR  2013  . LOOP RECORDER INSERTION N/A 03/23/2016   Procedure: Loop Recorder Insertion;  [name], MD;  [name];  Service: Cardiovascular;  [name];  . PARTIAL HYMENECTOMY    . TEE WITHOUT CARDIOVERSION N/A 03/23/2016   Procedure: TRANSESOPHAGEAL ECHOCARDIOGRAM (TEE);  [name], MD;  Location: [facility] ENDOSCOPY;  Service: Cardiovascular;  [name];     A IV Location/Drains/Wounds Patient Lines/Drains/Airways Status   Active Line/Drains/Airways    Name:   Placement date:   Placement time:   Site:   Days:   Peripheral IV 04/03/18 Right Hand   04/03/18    1939    Hand   less than 1   Peripheral IV 04/03/18 Left Forearm   04/03/18    1940    Forearm   less than 1          Intake/Output Last 24 hours No intake or output data in the 24 hours ending 04/03/18 2154  Labs/Imaging Results for orders placed or performed during the hospital encounter of 04/03/18 (from the past 48 hour(s))  Group A Strep by PCR     Status: None   Collection Time: 04/03/18  6:04 PM  Result Value Ref Range   Group A Strep by PCR NOT DETECTED NOT DETECTED    Comment: Performed at [facility], [address] [place], [place] [address]  CBC     Status: None   Collection Time: 04/03/18  7:13 PM  Result Value Ref Range   WBC 6.5 4.0 - 10.5 K/uL   RBC 4.54 3.87 - 5.11 MIL/uL   Hemoglobin 12.9 12.0 - 15.0 g/dL   HCT 84.1 32.4 - 40.1 %   MCV 87.7 80.0 - 100.0 fL   MCH 28.4 26.0 - 34.0 pg   MCHC 32.4 30.0 - 36.0 g/dL   RDW 02.7 25.3 - 66.4 %   Platelets 282 150 - 400 K/uL   nRBC 0.0 0.0 - 0.2 %    Comment: Performed at [facility], [address] [place], [place] [address]  Comprehensive metabolic panel     Status: Abnormal    Collection Time: 04/03/18  7:13 PM  Result Value Ref Range   Sodium 139 135 - 145 mmol/L   Potassium 4.0 3.5 - 5.1 mmol/L   Chloride 106 98 - 111 mmol/L   CO2 24 22 - 32 mmol/L   Glucose, Bld 98 70 - 99 mg/dL   BUN 16 8 - 23 mg/dL   Creatinine, Ser 4.25 0.44 - 1.00 mg/dL   Calcium 8.6 (L) 8.9 - 10.3 mg/dL   Total Protein 7.1 6.5 - 8.1 g/dL   Albumin 3.7 3.5 - 5.0 g/dL   AST 23 15 - 41 U/L   ALT 15 0 - 44 U/L   Alkaline Phosphatase 69 38 - 126 U/L   Total Bilirubin 0.2 (L) 0.3 - 1.2 mg/dL   GFR calc non Af Amer >60 >60 mL/min   GFR calc Af Amer >60 >60 mL/min   Anion gap 9 5 - 15    Comment: Performed at [facility], [address], [place], [place] [address]   Dg Chest 2 View  Result Date: 04/03/2018 CLINICAL DATA:  Cough and sore throat for 2 days.  Swelling in legs. EXAM: CHEST - 2 VIEW COMPARISON:  One-view chest x-ray 12/03/2017 FINDINGS: The heart is upper limits of normal for size. Mild hyperinflation is stable. New right middle lobe airspace disease is present. No focal airspace disease is present in the left. Visualized soft tissues and bony thorax are unremarkable. Implanted loop recorder is again noted. IMPRESSION: 1. New right middle lobe airspace disease/pneumonia. 2. Otherwise stable hyperinflation without other focal airspace disease. Electronically Signed   By: Marin Roberts M.D.   On: 04/03/2018 18:42    Pending Labs Unresulted Labs (From admission, onward)    Start     Ordered   04/03/18 1906  Blood culture (routine x 2)  BLOOD CULTURE X 2,   STAT     04/03/18 1905   Signed and Held  CBC  Tomorrow morning,   R     Signed and Held   Signed and Held  CBC  (enoxaparin (LOVENOX)    CrCl >/= 30 ml/min)  Once,   R    Comments:  Baseline for enoxaparin therapy IF NOT ALREADY DRAWN.  Notify MD if PLT < 100 K.    Signed and Held   Signed and Held  Creatinine, serum  (enoxaparin (LOVENOX)    CrCl >/= 30 ml/min)  Once,   R    Comments:  Baseline for  enoxaparin therapy IF NOT ALREADY DRAWN.  Signed and Held   Signed and Held  Creatinine, serum  (enoxaparin (LOVENOX)    CrCl >/= 30 ml/min)  Weekly,   R    Comments:  while on enoxaparin therapy    Signed and Held   Signed and Held  Comprehensive metabolic panel  Tomorrow morning,   R     Signed and Held   Signed and Held  Protime-INR  Tomorrow morning,   R     Signed and Held   Signed and Held  APTT  Tomorrow morning,   R     Signed and Held          Vitals/Pain Today's Vitals   04/03/18 1755 04/03/18 1756 04/03/18 1759 04/03/18 1838  BP: (!) 174/89     Pulse: 89     Temp: 98 F (36.7 C)     TempSrc: Oral     SpO2: 97%     Weight:  86.2 kg    Height:  5\' 1"  (1.549 m)    PainSc:   0-No pain 0-No pain    Isolation Precautions No active isolations  Medications Medications  methylPREDNISolone sodium succinate (SOLU-MEDROL) 125 mg/2 mL injection 125 mg (125 mg Intravenous Given 04/03/18 1948)  cefTRIAXone (ROCEPHIN) 1 g in sodium chloride 0.9 % 100 mL IVPB (0 g Intravenous Stopped 04/03/18 2044)  azithromycin (ZITHROMAX) 500 mg in sodium chloride 0.9 % 250 mL IVPB (0 mg Intravenous Stopped 04/03/18 2150)  ipratropium-albuterol (DUONEB) 0.5-2.5 (3) MG/3ML nebulizer solution 3 mL (3 mLs Nebulization Given 04/03/18 1948)  ipratropium-albuterol (DUONEB) 0.5-2.5 (3) MG/3ML nebulizer solution 3 mL (3 mLs Nebulization Given 04/03/18 1948)    Mobility walks with device Moderate fall risk   Focused Assessments Pulmonary Assessment Handoff:  Lung sounds: Bilateral Breath Sounds: Diminished L Breath Sounds: Diminished O2 Device: Room Air        R Recommendations: See Admitting Provider Note  Report given to:   Additional Notes:

## 2018-04-03 NOTE — H&P (Signed)
[facility] [place] at [facility]   PATIENT NAME: Lauren Koch    MR#:  [id number]  DATE OF BIRTH:  06/04/1950  DATE OF ADMISSION:  04/03/2018  PRIMARY CARE PHYSICIAN: McLean-Scocuzza, Pasty Spillers, MD   REQUESTING/REFERRING PHYSICIAN: Dr. Cyril Loosen  CHIEF COMPLAINT:   Chief Complaint  Patient presents with  . Cough    HISTORY OF PRESENT ILLNESS:  Lauren Koch  is a 68 y.o. female with a known history listed below presented to emergency room for evaluation of cough, wheezing and shortness of breath since last 2 to 3 days.  Patient has cough without sputum production.  Patient is complaining of wheezing and shortness of breath for last 2 to 3 days that is progressively worsening.  Patient denies fever or chills.  No recent travel.  Patient denies flulike symptoms.  Denies smoking.  Patient feels that her symptoms are related to change in weather.  Denies chest pain or dizziness.  No other complaints.  In emergency room patient received Solu-Medrol and breathing treatment.  Labs and x-ray chest done.  X-ray chest suggestive of pneumonia.  Patient did not improve much after breathing treatment.  Still wheezing and coughing.  Hospitalist team requested for admission.  PAST MEDICAL HISTORY:   Past Medical History:  Diagnosis Date  . Asthma   . Cataract   . COPD (chronic obstructive pulmonary disease) (HCC)   . Cryptogenic Stroke (HCC)    a. 03/2016 L ACA, L MCX, L PCA, and R dorsal pontine territory infarcts; b. 03/2016 Carotid U/S: 1-39% bil dzs; c. 03/2016 TEE: EF 60-65%, no rwma, no LA/LAA/RA/RAA thrombus. No PFO; d. 03/2016 s/p MDT Reveal Linq (ser# [id number]) - no AFib noted to date (some sinus w/ freq PACs).  . Dementia (HCC)   . Diastolic dysfunction    a. 03/2016 Echo: EF 60-65%, gr1 DD, nl RV fxn.  Marland Kitchen GERD (gastroesophageal reflux disease)   . Hypertension   . Smoker   . Vitamin D deficiency     PAST SURGICAL HISTORY:   Past Surgical History:  Procedure  Laterality Date  . ABDOMINAL HYSTERECTOMY     partial   . CESAREAN SECTION  1971/1976  . HERNIA REPAIR  2013  . LOOP RECORDER INSERTION N/A 03/23/2016   Procedure: Loop Recorder Insertion;  [name], MD;  [name];  Service: Cardiovascular;  [name];  . PARTIAL HYMENECTOMY    . TEE WITHOUT CARDIOVERSION N/A 03/23/2016   Procedure: TRANSESOPHAGEAL ECHOCARDIOGRAM (TEE);  [name], MD;  Location: [facility] ENDOSCOPY;  Service: Cardiovascular;  [name];    SOCIAL HISTORY:   Social History   Tobacco Use  . Smoking status: Former Smoker    Packs/day: 1.00    Years: 48.00    Pack years: 48.00    Types: Cigarettes    Last attempt to quit: 12/02/2017    Years since quitting: 0.3  . Smokeless tobacco: Former Neurosurgeon    Quit date: 05/29/2015  Substance Use Topics  . Alcohol use: Yes    Alcohol/week: 0.0 standard drinks    Comment: occasionally    FAMILY HISTORY:   Family History  Problem Relation Age of Onset  . Diabetes Mother   . Hypertension Mother   . Miscarriages / [place] Mother   . Hyperlipidemia Mother   . Heart disease Father   . Diabetes Father   . Hypertension Father   . Hyperlipidemia Father   . Stroke Father   . Cancer  Sister        skin  . Diabetes Other   . Hyperlipidemia Daughter     DRUG ALLERGIES:   Allergies  Allergen Reactions  . Prednisone Rash and Other (See Comments)    Arrhythmia, also (Has tolerated hydrocortisone and decadron)     REVIEW OF SYSTEMS:   ROS -12 point review of system reviewed positive as per HPI otherwise negative  MEDICATIONS AT HOME:   Prior to Admission medications   Medication Sig Start Date End Date Taking? Authorizing Provider  albuterol (PROVENTIL HFA;[name] [name] Patient not taking: Reported on 12/26/2017 03/03/15   Allayne Butcher  B, PA-C  atorvastatin (LIPITOR) 40 MG tablet [name] 06/10/17   McLean-Scocuzza, Pasty Spillers, MD  Cholecalciferol 1.25 MG (50000 UT) capsule Take 1 capsule (50,000 Units total) by mouth once a week. 12/21/17   McLean-Scocuzza, Pasty Spillers, MD  dexamethasone (DECADRON) 4 MG tablet Decadron 4 mg PO BID X 1 week, Decadron 4 mg Daily X 1 week, Decadron 2 mg Daily X 1 week and then STOP. 12/08/17   Houston Siren, MD  donepezil (ARICEPT) 5 MG tablet [name] 06/10/17   McLean-Scocuzza, Pasty Spillers, MD  ezetimibe (ZETIA) 10 MG tablet Take 1 tablet (10 mg total) by mouth daily. 12/22/17   McLean-Scocuzza, Pasty Spillers, MD  Fluticasone-Umeclidin-Vilant (TRELEGY ELLIPTA) 100-62.5-25 MCG/INH AEPB Inhale 1 puff into the lungs daily. Rinse mouth 01/04/18   McLean-Scocuzza, Pasty Spillers, MD  ipratropium-albuterol (DUONEB) 0.5-2.5 (3) MG/3ML SOLN [name] Patient taking differently: Take 3 mLs by nebulization every 6 (six) hours as needed (for wheezing/shortness of breath).  06/10/17   McLean-Scocuzza, Pasty Spillers, MD  lisinopril (PRINIVIL,[name] [name] Patient taking differently: Take 20 mg by mouth daily.  06/10/17   McLean-Scocuzza, Pasty Spillers, MD  nicotine (NICODERM CQ - DOSED IN MG/24 HOURS) 21 mg/24hr patch Place 1 patch (21 mg total) onto the skin daily. 12/08/17   Houston Siren, MD      VITAL SIGNS:  Blood pressure (!) 174/89, pulse 89, temperature 98 F (36.7 C), temperature source Oral, height 5\' 1"  (1.549 m), weight 86.2 kg, SpO2 97 %.  PHYSICAL EXAMINATION:  Physical Exam  GENERAL:  68 y.o.-year-old patient lying in the bed with no acute distress.  EYES: Pupils equal, round, reactive to light and accommodation. No scleral icterus. Extraocular muscles intact.  HEENT: Head atraumatic, normocephalic. Oropharynx and nasopharynx clear.  NECK:  Supple, no jugular  venous distention. No thyroid enlargement, no tenderness.  LUNGS: Bilateral expiratory wheezing CARDIOVASCULAR: S1, S2 normal. No murmurs, rubs, or gallops.  ABDOMEN: Soft, nontender, nondistended. Bowel sounds present. No organomegaly or mass.  EXTREMITIES: No pedal edema, cyanosis, or clubbing.  NEUROLOGIC: Cranial nerves II through XII are intact. Muscle strength 5/5 in all extremities. Sensation intact. Gait not checked.  PSYCHIATRIC: Alert awake and conversant,non- focal SKIN: No obvious rash, lesion, or ulcer.   LABORATORY PANEL:   CBC Recent Labs  Lab 04/03/18 1913  WBC 6.5  HGB 12.9  HCT 39.8  PLT 282   ------------------------------------------------------------------------------------------------------------------  Chemistries  Recent Labs  Lab 04/03/18 1913  NA 139  K 4.0  CL 106  CO2 24  GLUCOSE 98  BUN 16  CREATININE 0.88  CALCIUM 8.6*  AST 23  ALT 15  ALKPHOS 69  BILITOT 0.2*   ------------------------------------------------------------------------------------------------------------------  Cardiac Enzymes No results for input(s): TROPONINI in the last 168 hours. ------------------------------------------------------------------------------------------------------------------  RADIOLOGY:  Dg Chest 2 View  Result Date: 04/03/2018 CLINICAL DATA:  Cough and sore throat for 2 days.  Swelling in legs. EXAM: CHEST - 2 VIEW COMPARISON:  One-view chest x-ray 12/03/2017 FINDINGS: The heart is upper limits of normal for size. Mild hyperinflation is stable. New right middle lobe airspace disease is present. No focal airspace disease is present in the left. Visualized soft tissues and bony thorax are unremarkable. Implanted loop recorder is again noted. IMPRESSION: 1. New right middle lobe airspace disease/pneumonia. 2. Otherwise stable hyperinflation without other focal airspace disease. Electronically Signed   By: Marin Roberts M.D.   On: 04/03/2018 18:42       IMPRESSION AND PLAN:   1.  Acute COPD exacerbation: Patient started on IV steroid, breathing treatment, oxygen.  Symptomatic care.  Titrate oxygen as indicated.  2.  Community-acquired pneumonia: Patient started on IV Rocephin and azithromycin.  Mucinex.  Symptomatic care.  Continue oxygen.  Titrate as indicated.  3.  Hypertension: Monitor.  Continue home medications.  Adjust medication as indicated.  PRN IV hydralazine  4.  Chronic other medical problems: Monitor.  Continue home medication as ordered  DVT prophylaxis: Lovenox subcutaneous  Moderate risk secondary to above  Estimated length of stay more than 2 midnights  The treatment based on clinical course  All the records are reviewed and case discussed with ED provider. Management plans discussed with the patient, family and they are in agreement.  CODE STATUS: Full  TOTAL TIME TAKING CARE OF THIS PATIENT: 36 minutes.    Montez Morita M.D on 04/03/2018 at 10:02 PM  Between 7am to 6pm - Pager - [phone]  After 6pm go to [email or web address] - Social research officer, government  [employer]  [facility]  Office  [phone]  CC: Primary care physician; McLean-Scocuzza, Pasty Spillers, MD

## 2018-04-03 NOTE — ED Notes (Signed)
Purple and green top sent to lab 

## 2018-04-03 NOTE — ED Provider Notes (Signed)
[facility] Emergency Department Provider Note   ____________________________________________    I have reviewed the triage vital signs and the nursing notes.   HISTORY  Chief Complaint Cough     HPI Lauren Koch is a 68 y.o. female who presents with cough and shortness of breath.  Patient reports worsening cough over the last 3 days as well as worsening shortness of breath.  Patient has a history of COPD, she denies fevers or chills.  No recent travel.  No calf pain or swelling.  Has not smoked in many years.  Used a breathing treatment at home with little improvement.  Past Medical History:  Diagnosis Date  . Asthma   . Cataract   . COPD (chronic obstructive pulmonary disease) (HCC)   . Cryptogenic Stroke (HCC)    a. 03/2016 L ACA, L MCX, L PCA, and R dorsal pontine territory infarcts; b. 03/2016 Carotid U/S: 1-39% bil dzs; c. 03/2016 TEE: EF 60-65%, no rwma, no LA/LAA/RA/RAA thrombus. No PFO; d. 03/2016 s/p MDT Reveal Linq (ser# [id number]) - no AFib noted to date (some sinus w/ freq PACs).  . Dementia (HCC)   . Diastolic dysfunction    a. 03/2016 Echo: EF 60-65%, gr1 DD, nl RV fxn.  Marland Kitchen GERD (gastroesophageal reflux disease)   . Hypertension   . Smoker   . Vitamin D deficiency     Patient Active Problem List   Diagnosis Date Noted  . COPD (chronic obstructive pulmonary disease) with acute bronchitis (HCC) 04/03/2018  . Physical deconditioning 12/21/2017  . Urinary incontinence 12/21/2017  . Acute on chronic respiratory failure with hypoxia (HCC) 11/26/2017  . Dyspnea 11/25/2017  . Tachycardia 11/25/2017  . Syncope 06/24/2017  . Memory loss 06/10/2017  . Abnormal thyroid function test 06/10/2017  . Breast cyst, left 06/10/2017  . Hyperlipemia 03/25/2016  . 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 Osteopenia 08/08/2015  . Pulmonary emphysema (HCC)   . Abnormal TSH 05/30/2015  . Acute  respiratory failure with hypoxia (HCC) 05/29/2015  . COPD exacerbation (HCC) 05/29/2015  . Acute kidney injury (HCC) 05/29/2015  . Morbid obesity (HCC) 05/29/2015  . Tobacco abuse 05/29/2015  . Right leg pain 05/29/2015  . Essential hypertension 05/29/2015  . Vitamin D deficiency 04/17/2015  . Smoker 03/03/2015  . Asthma   . Cataract   . COPD (chronic obstructive pulmonary disease) (HCC)   . GERD (gastroesophageal reflux disease)     Past Surgical History:  Procedure Laterality Date  . ABDOMINAL HYSTERECTOMY     partial   . CESAREAN SECTION  1971/1976  . HERNIA REPAIR  2013  . LOOP RECORDER INSERTION N/A 03/23/2016   Procedure: Loop Recorder Insertion;  [name], MD;  [name];  Service: Cardiovascular;  [name];  . PARTIAL HYMENECTOMY    . TEE WITHOUT CARDIOVERSION N/A 03/23/2016   Procedure: TRANSESOPHAGEAL ECHOCARDIOGRAM (TEE);  [name], MD;  Location: [facility] ENDOSCOPY;  Service: Cardiovascular;  [name];    Prior to Admission medications   Medication Sig Start Date End Date Taking? Authorizing Provider  albuterol (PROVENTIL HFA;[name] [name] Patient not taking: Reported on 12/26/2017 03/03/15   Allayne Butcher B, PA-C  atorvastatin (LIPITOR) 40 MG tablet Take 1 tablet (40 mg total) by mouth daily at 6  PM. 06/10/17   McLean-Scocuzza, Pasty Spillers, MD  Cholecalciferol 1.25 MG (50000 UT) capsule Take 1 capsule (50,000 Units total) by mouth once a week. 12/21/17   McLean-Scocuzza, Pasty Spillers, MD  dexamethasone (DECADRON) 4 MG tablet Decadron 4 mg PO BID X 1 week, Decadron 4 mg Daily X 1 week, Decadron 2 mg Daily X 1 week and then STOP. 12/08/17   Houston Siren, MD  donepezil (ARICEPT) 5 MG tablet [name] 06/10/17   McLean-Scocuzza, Pasty Spillers, MD  ezetimibe (ZETIA) 10 MG  tablet Take 1 tablet (10 mg total) by mouth daily. 12/22/17   McLean-Scocuzza, Pasty Spillers, MD  Fluticasone-Umeclidin-Vilant (TRELEGY ELLIPTA) 100-62.5-25 MCG/INH AEPB Inhale 1 puff into the lungs daily. Rinse mouth 01/04/18   McLean-Scocuzza, Pasty Spillers, MD  ipratropium-albuterol (DUONEB) 0.5-2.5 (3) MG/3ML SOLN [name] Patient taking differently: Take 3 mLs by nebulization every 6 (six) hours as needed (for wheezing/shortness of breath).  06/10/17   McLean-Scocuzza, Pasty Spillers, MD  lisinopril (PRINIVIL,[name] [name] Patient taking differently: Take 20 mg by mouth daily.  06/10/17   McLean-Scocuzza, Pasty Spillers, MD  nicotine (NICODERM CQ - DOSED IN MG/24 HOURS) 21 mg/24hr patch Place 1 patch (21 mg total) onto the skin daily. 12/08/17   Houston Siren, MD     Allergies Prednisone  Family History  Problem Relation Age of Onset  . Diabetes Mother   . Hypertension Mother   . Miscarriages / [place] Mother   . Hyperlipidemia Mother   . Heart disease Father   . Diabetes Father   . Hypertension Father   . Hyperlipidemia Father   . Stroke Father   . Cancer Sister        skin  . Diabetes Other   . Hyperlipidemia Daughter     Social History Social History   Tobacco Use  . Smoking status: Former Smoker    Packs/day: 1.00    Years: 48.00    Pack years: 48.00    Types: Cigarettes    Last attempt to quit: 12/02/2017    Years since quitting: 0.3  . Smokeless tobacco: Former Neurosurgeon    Quit date: 05/29/2015  Substance Use Topics  . Alcohol use: Yes    Alcohol/week: 0.0 standard drinks    Comment: occasionally  . Drug use: No    Review of Systems  Constitutional: No fever/chills Eyes: No visual changes.  ENT: No sore throat. Cardiovascular: Denies chest pain. Respiratory: Wheezing and cough and shortness of breath Gastrointestinal: No abdominal pain.  No nausea, no vomiting.   Genitourinary: Negative for  dysuria. Musculoskeletal: Negative for back pain. Skin: Negative for rash. Neurological: Negative for headaches   ____________________________________________   PHYSICAL EXAM:  VITAL SIGNS: ED Triage Vitals  Enc Vitals Group     BP 04/03/18 1755 (!) 174/89     Pulse Rate 04/03/18 1755 89     Resp --      Temp 04/03/18 1755 98 F (36.7 C)     Temp Source 04/03/18 1755 Oral     SpO2 04/03/18 1755 97 %     Weight 04/03/18 1756 86.2 kg (190 lb)     Height 04/03/18 1756 1.549 m ( )     Head Circumference --      Peak Flow --      Pain Score 04/03/18 1759 0  Pain Loc --      Pain Edu? --      Excl. in GC? --     Constitutional: Alert and oriented. Eyes: Conjunctivae are normal.   Nose: No congestion/rhinnorhea. Mouth/Throat: Mucous membranes are moist.    Cardiovascular: Normal rate, regular rhythm. Grossly normal heart sounds.  Good peripheral circulation. Respiratory: Increased work of breathing with tachypnea, no retractions, diffuse wheezing, bibasilar Rales Gastrointestinal: Soft and nontender. No distention.    Musculoskeletal: No lower extremity tenderness nor edema.  Warm and well perfused Neurologic:  Normal speech and language. No gross focal neurologic deficits are appreciated.  Skin:  Skin is warm, dry and intact. No rash noted. Psychiatric: Mood and affect are normal. Speech and behavior are normal.  ____________________________________________   LABS (all labs ordered are listed, but only abnormal results are displayed)  Labs Reviewed  COMPREHENSIVE METABOLIC PANEL - Abnormal; Notable for the following components:      Result Value   Calcium 8.6 (*)    Total Bilirubin 0.2 (*)    All other components within normal limits  GROUP A STREP BY PCR  CULTURE, BLOOD (ROUTINE X 2)  CULTURE, BLOOD (ROUTINE X 2)  CBC   ____________________________________________  EKG  None ____________________________________________  RADIOLOGY  Chest x-ray  demonstrates right middle lobe airspace disease ____________________________________________   PROCEDURES  Procedure(s) performed: No  Procedures   Critical Care performed: No ____________________________________________   INITIAL IMPRESSION / ASSESSMENT AND PLAN / ED COURSE  Pertinent labs & imaging results that were available during my care of the patient were reviewed by me and considered in my medical decision making (see chart for details).  Patient presents with shortness of breath, coughing, wheezing.  Chest x-ray demonstrates right middle lobe airspace disease.  We will treat with IV Rocephin, IV azithromycin, IV Solu-Medrol as well as duo nebs for her wheezing.  Lab work thus far is unremarkable.  She has had little improvement, will require admission to the hospital service    ____________________________________________   FINAL CLINICAL IMPRESSION(S) / ED DIAGNOSES  Final diagnoses:  Community acquired pneumonia of right middle lobe of lung (HCC)  Chronic obstructive pulmonary disease with acute exacerbation (HCC)  Shortness of breath        Note:  This document was prepared using Dragon voice recognition software and may include unintentional dictation errors.   Jene Every, MD 04/03/18 [phone]

## 2018-04-04 ENCOUNTER — Other Ambulatory Visit: Payer: Self-pay

## 2018-04-04 ENCOUNTER — Ambulatory Visit: Payer: Medicare Other | Admitting: Internal Medicine

## 2018-04-04 LAB — COMPREHENSIVE METABOLIC PANEL
ALT: 14 U/L (ref 0–44)
AST: 25 U/L (ref 15–41)
Albumin: 3.4 g/dL — ABNORMAL LOW (ref 3.5–5.0)
Alkaline Phosphatase: 63 U/L (ref 38–126)
Anion gap: 10 (ref 5–15)
BUN: 14 mg/dL (ref 8–23)
CO2: 22 mmol/L (ref 22–32)
Calcium: 8.8 mg/dL — ABNORMAL LOW (ref 8.9–10.3)
Chloride: 107 mmol/L (ref 98–111)
Creatinine, Ser: 0.73 mg/dL (ref 0.44–1.00)
GFR calc Af Amer: >60 mL/min (ref >60)
GFR calc non Af Amer: >60 mL/min (ref >60)
GLUCOSE: 195 mg/dL — AB (ref 70–99)
Potassium: 4.3 mmol/L (ref 3.5–5.1)
SODIUM: 139 mmol/L (ref 135–145)
Total Bilirubin: 0.3 mg/dL (ref 0.3–1.2)
Total Protein: 6.9 g/dL (ref 6.5–8.1)

## 2018-04-04 LAB — CBC
HCT: 38.6 % (ref 36.0–46.0)
Hemoglobin: 12.3 g/dL (ref 12.0–15.0)
MCH: 27.9 pg (ref 26.0–34.0)
MCHC: 31.9 g/dL (ref 30.0–36.0)
MCV: 87.5 fL (ref 80.0–100.0)
NRBC: 0 % (ref 0.0–0.2)
Platelets: 277 10*3/uL (ref 150–400)
RBC: 4.41 MIL/uL (ref 3.87–5.11)
RDW: 12.9 % (ref 11.5–15.5)
WBC: 5.6 10*3/uL (ref 4.0–10.5)

## 2018-04-04 LAB — APTT: aPTT: 36 seconds (ref 24–36)

## 2018-04-04 LAB — PROTIME-INR
INR: 1 (ref 0.8–1.2)
Prothrombin Time: 13.1 seconds (ref 11.4–15.2)

## 2018-04-04 LAB — PROCALCITONIN: Procalcitonin: <0.1 ng/mL

## 2018-04-04 LAB — GLUCOSE, CAPILLARY: Glucose-Capillary: 165 mg/dL — ABNORMAL HIGH (ref 70–99)

## 2018-04-04 MED ORDER — GUAIFENESIN-DM 100-10 MG/5ML PO SYRP
5.0000 mL | ORAL_SOLUTION | ORAL | Status: DC | PRN
  Administered 2018-04-04: 14:00:00 5 mL via ORAL
  Filled 2018-04-04: qty 5

## 2018-04-04 MED ORDER — IPRATROPIUM-ALBUTEROL 0.5-2.5 (3) MG/3ML IN SOLN
3.0000 mL | Freq: Three times a day (TID) | RESPIRATORY_TRACT | Status: DC
  Administered 2018-04-04 – 2018-04-05 (×3): 3 mL via RESPIRATORY_TRACT
  Filled 2018-04-04 (×3): qty 3

## 2018-04-04 MED ORDER — HYDROCHLOROTHIAZIDE 12.5 MG PO CAPS
12.5000 mg | ORAL_CAPSULE | Freq: Every day | ORAL | Status: DC
  Administered 2018-04-04 – 2018-04-05 (×2): 12.5 mg via ORAL
  Filled 2018-04-04 (×2): qty 1

## 2018-04-04 MED ORDER — AZITHROMYCIN 500 MG PO TABS
500.0000 mg | ORAL_TABLET | Freq: Every day | ORAL | Status: DC
  Administered 2018-04-04: 500 mg via ORAL
  Filled 2018-04-04: qty 1

## 2018-04-04 MED ORDER — LISINOPRIL 20 MG PO TABS
20.0000 mg | ORAL_TABLET | Freq: Every day | ORAL | Status: DC
  Administered 2018-04-04 – 2018-04-05 (×2): 20 mg via ORAL
  Filled 2018-04-04 (×2): qty 1

## 2018-04-04 NOTE — Progress Notes (Signed)
[facility] - [place] at [facility]   PATIENT NAME: Lauren Koch    MR#:  [id number]  DATE OF BIRTH:  04-30-1950  SUBJECTIVE:  came in with increasing cough and shortness of breath. Daughter in the room. Feels a lot better. Has oxygen for chronic home use.  REVIEW OF SYSTEMS:   Review of Systems  Constitutional: Negative for chills, fever and weight loss.  HENT: Negative for ear discharge, ear pain and nosebleeds.   Eyes: Negative for blurred vision, pain and discharge.  Respiratory: Positive for cough, sputum production, shortness of breath and wheezing. Negative for stridor.   Cardiovascular: Negative for chest pain, palpitations, orthopnea and PND.  Gastrointestinal: Negative for abdominal pain, diarrhea, nausea and vomiting.  Genitourinary: Negative for frequency and urgency.  Musculoskeletal: Negative for back pain and joint pain.  Neurological: Positive for weakness. Negative for sensory change, speech change and focal weakness.  Psychiatric/Behavioral: Negative for depression and hallucinations. The patient is not nervous/anxious.    Tolerating Diet: yes Tolerating PT: ambulates  DRUG ALLERGIES:   Allergies  Allergen Reactions  . Prednisone Rash and Other (See Comments)    Arrhythmia, also (Has tolerated hydrocortisone and decadron)     VITALS:  Blood pressure (!) 146/89, pulse 97, temperature 98.6 F (37 C), temperature source Oral, resp. rate 18, height 5\' 1"  (1.549 m), weight 86.2 kg, SpO2 94 %.  PHYSICAL EXAMINATION:   Physical Exam  GENERAL:  68 y.o.-year-old patient lying in the bed with no acute distress. Obese EYES: Pupils equal, round, reactive to light and accommodation. No scleral icterus. Extraocular muscles intact.  HEENT: Head atraumatic, normocephalic. Oropharynx and nasopharynx clear.  NECK:  Supple, no jugular venous distention. No thyroid enlargement, no tenderness.  LUNGS:coarse breath sounds bilaterally, no wheezing,  rales, rhonchi. No use of accessory muscles of respiration.  CARDIOVASCULAR: S1, S2 normal. No murmurs, rubs, or gallops.  ABDOMEN: Soft, nontender, nondistended. Bowel sounds present. No organomegaly or mass.  EXTREMITIES: No cyanosis, clubbing  + edema b/l.    NEUROLOGIC: Cranial nerves II through XII are intact. No focal Motor or sensory deficits b/l.   PSYCHIATRIC:  patient is alert and oriented x 3.  SKIN: No obvious rash, lesion, or ulcer.   LABORATORY PANEL:  CBC Recent Labs  Lab 04/04/18 0535  WBC 5.6  HGB 12.3  HCT 38.6  PLT 277    Chemistries  Recent Labs  Lab 04/04/18 0535  NA 139  K 4.3  CL 107  CO2 22  GLUCOSE 195*  BUN 14  CREATININE 0.73  CALCIUM 8.8*  AST 25  ALT 14  ALKPHOS 63  BILITOT 0.3   Cardiac Enzymes No results for input(s): TROPONINI in the last 168 hours. RADIOLOGY:  Dg Chest 2 View  Result Date: 04/03/2018 CLINICAL DATA:  Cough and sore throat for 2 days.  Swelling in legs. EXAM: CHEST - 2 VIEW COMPARISON:  One-view chest x-ray 12/03/2017 FINDINGS: The heart is upper limits of normal for size. Mild hyperinflation is stable. New right middle lobe airspace disease is present. No focal airspace disease is present in the left. Visualized soft tissues and bony thorax are unremarkable. Implanted loop recorder is again noted. IMPRESSION: 1. New right middle lobe airspace disease/pneumonia. 2. Otherwise stable hyperinflation without other focal airspace disease. Electronically Signed   By: Marin Roberts M.D.   On: 04/03/2018 18:42   ASSESSMENT AND PLAN:  Lauren Koch  is a 68 y.o. female with a known history listed below presented  to emergency room for evaluation of cough, wheezing and shortness of breath since last 2 to 3 days.  Patient has cough without sputum production.  Patient is complaining of wheezing and shortness of breath for last 2 to 3 days that is progressively worsening.  1.  Acute COPD exacerbation: Patient started on IV steroid,  breathing treatment, oxygen.  Symptomatic care.  Titrate oxygen as indicated. -Patient has oxygen at home to use. She uses on as needed basis  2. right middle lobe community-acquired pneumonia: -continue PO azithromycin.   -Mucinex BID and PRN Robitussin  -afebrile white count is normal  3.  Hypertension:  - Continue home medications.  -Lisinopril and added hydrochlorothiazide -  PRN IV hydralazine  4.  Chronic other medical problems: Monitor.  Continue home medication as ordered  5. DVT prophylaxis: Lovenox subcutaneous  6. Bilateral lower extremity mile edema -has EF of 60 to 65%. Does not seem to be in heart failure. I will start low-dose hydrochlorothiazide.  If continues to improve will discharge tomorrow. This was discussed with patient and daughter. She is agreeable.  Case discussed with Care Management/Social Worker. Management plans discussed with the patient, family and they are in agreement.  CODE STATUS: full  DVT Prophylaxis: Lovenox  TOTAL TIME TAKING CARE OF THIS PATIENT: *30* minutes.  >50% time spent on counselling and coordination of care  POSSIBLE D/C IN *1-2 DAYS, DEPENDING ON CLINICAL CONDITION.  Note: This dictation was prepared with Dragon dictation along with smaller phrase technology. Any transcriptional errors that result from this process are unintentional.  Enedina Finner M.D on 04/04/2018 at 2:03 PM  Between 7am to 6pm - Pager - [phone]  After 6pm go to [email or web address] - Social research officer, government  [facility]  Office  [phone]  CC: Primary care physician; McLean-Scocuzza, Lauren Koch, MDPatient ID: Lauren Koch, female   DOB: 1950-08-14, 68 y.o.   MRN: [id number]

## 2018-04-04 NOTE — Care Management (Signed)
Independent in all adls, denies issues accessing medical care, obtaining medications or with transportation.  Current with her PCP. Has chronic home 02. Has declined home health several times in the past. At present no discharge needs identified by members of the care team

## 2018-04-05 MED ORDER — AZITHROMYCIN 500 MG PO TABS: 500.0000 mg | ORAL_TABLET | Freq: Every day | ORAL | 0 refills | Status: DC

## 2018-04-05 MED ORDER — GUAIFENESIN ER 600 MG PO TB12: 600.0000 mg | ORAL_TABLET | Freq: Two times a day (BID) | ORAL | 0 refills | Status: DC

## 2018-04-05 MED ORDER — HYDROCHLOROTHIAZIDE 12.5 MG PO CAPS: 12.5000 mg | ORAL_CAPSULE | Freq: Every day | ORAL | 0 refills | Status: DC

## 2018-04-05 NOTE — Discharge Summary (Signed)
[facility] - [place] [facility]   PATIENT NAME: Lauren Koch    MR#:  [id number]  DATE OF BIRTH:  October 29, 1950  DATE OF ADMISSION:  04/03/2018 ADMITTING PHYSICIAN: Montez Morita, MD  DATE OF DISCHARGE: 04/05/2018  PRIMARY CARE PHYSICIAN: McLean-Scocuzza, Pasty Spillers, MD    ADMISSION DIAGNOSIS:  Shortness of breath [R06.02] Chronic obstructive pulmonary disease with acute exacerbation (HCC) [J44.1] Community acquired pneumonia of right middle lobe of lung (HCC) [J18.1]  DISCHARGE DIAGNOSIS:  Acute on Chronic COPD flare Pneumonia  SECONDARY DIAGNOSIS:   Past Medical History:  Diagnosis Date  . Asthma   . Cataract   . COPD (chronic obstructive pulmonary disease) (HCC)   . Cryptogenic Stroke (HCC)    a. 03/2016 L ACA, L MCX, L PCA, and R dorsal pontine territory infarcts; b. 03/2016 Carotid U/S: 1-39% bil dzs; c. 03/2016 TEE: EF 60-65%, no rwma, no LA/LAA/RA/RAA thrombus. No PFO; d. 03/2016 s/p MDT Reveal Linq (ser# [id number]) - no AFib noted to date (some sinus w/ freq PACs).  . Dementia (HCC)   . Diastolic dysfunction    a. 03/2016 Echo: EF 60-65%, gr1 DD, nl RV fxn.  Marland Kitchen GERD (gastroesophageal reflux disease)   . Hypertension   . Smoker   . Vitamin D deficiency     HOSPITAL COURSE:   Lauren Koch a68 y.o.femalewith a known history listed below presented to emergency room for evaluation of cough, wheezing and shortness of breath since last 2 to 3 days. Patient has cough without sputum production. Patient is complaining of wheezing and shortness of breath for last 2 to 3 days that is progressively worsening.  1.Acute COPD exacerbation:Patient started on IV steroid, breathing treatment, oxygen. Symptomatic care. Titrate oxygen as indicated. -Patient has oxygen at home to use. She uses on as needed basis -no wheezing. Feels ok. -pt is intolerant to PO prednisone  2.right middle lobe community-acquired pneumonia: -continue PO azithromycin.   -Mucinex BID and PRN Robitussin  -afebrile white count is normal  3.Hypertension: -Continue home medications.  -Lisinopril and added hydrochlorothiazide - PRN IV hydralazine  4.Chronic other medical problems:Monitor. Continue home medication as ordered  5. DVT prophylaxis:Lovenox subcutaneous  6. Bilateral lower extremity mile edema -has EF of 60 to 65%. Does not seem to be in heart failure. I will start low-dose hydrochlorothiazide.  Pt does not want HH services Cont oxygen and nebs at home as before D/c home. Pt agreeble. CONSULTS OBTAINED:    DRUG ALLERGIES:   Allergies  Allergen Reactions  . Prednisone Rash and Other (See Comments)    Arrhythmia, also (Has tolerated hydrocortisone and decadron)     DISCHARGE MEDICATIONS:   Allergies as of 04/05/2018      Reactions   Prednisone Rash, Other (See Comments)   Arrhythmia, also (Has tolerated hydrocortisone and decadron)      Medication List    STOP taking these medications   Cholecalciferol 1.25 MG (50000 UT) capsule   dexamethasone 4 MG tablet Commonly known as:  DECADRON   nicotine 21 mg/24hr patch Commonly known as:  NICODERM CQ - dosed in mg/24 hours     TAKE these medications   albuterol 108 (90 Base) MCG/ACT inhaler Commonly known as:  PROVENTIL HFA;VENTOLIN HFA [name]   atorvastatin 40 MG tablet Commonly known as:  LIPITOR [name]   azithromycin 500 MG  tablet Commonly known as:  ZITHROMAX Take 1 tablet (500 mg total) by mouth daily.   donepezil 5 MG tablet Commonly known as:  ARICEPT [name]   ezetimibe 10 MG tablet Commonly known as:  ZETIA Take 1 tablet (10 mg total) by mouth daily.   Fluticasone-Umeclidin-Vilant 100-62.5-25 MCG/INH Aepb Commonly known as:  TRELEGY ELLIPTA Inhale 1 puff into the lungs  daily. Rinse mouth   guaiFENesin 600 MG 12 hr tablet Commonly known as:  MUCINEX Take 1 tablet (600 mg total) by mouth 2 (two) times daily.   hydrochlorothiazide 12.5 MG capsule Commonly known as:  MICROZIDE Take 1 capsule (12.5 mg total) by mouth daily.   ipratropium-albuterol 0.5-2.5 (3) MG/3ML Soln Commonly known as:  DUONEB [name] What changed:    when to take this  reasons to take this   lisinopril 20 MG tablet Commonly known as:  PRINIVIL,ZESTRIL [name] What changed:    how much to take  how to take this  when to take this  additional instructions       If you experience worsening of your admission symptoms, develop shortness of breath, life threatening emergency, suicidal or homicidal thoughts you must seek medical attention immediately by calling 911 or calling your MD immediately  if symptoms less severe.  You Must read complete instructions/literature along with all the possible adverse reactions/side effects for all the Medicines you take and that have been prescribed to you. Take any new Medicines after you have completely understood and accept all the possible adverse reactions/side effects.   Please note  You were cared for by a hospitalist during your hospital stay. If you have any questions about your discharge medications or the care you received while you were in the hospital after you are discharged, you can call the unit and asked to speak with the hospitalist on call if the hospitalist that took care of you is not available. Once you are discharged, your primary care physician will handle any further medical issues. Please note that NO REFILLS for any discharge medications will be authorized once you are discharged, as it is imperative that you return to your primary care physician (or establish a relationship with a primary care physician if you do not have one) for your aftercare needs  so that they can reassess your need for medications and monitor your lab values. Today   SUBJECTIVE   Doing well.   VITAL SIGNS:  Blood pressure (!) 153/96, pulse 65, temperature 98.4 F (36.9 C), temperature source Oral, resp. rate 18, height 5\' 1"  (1.549 m), weight 86.2 kg, SpO2 90 %.  I/O:  No intake or output data in the 24 hours ending 04/05/18 0844  PHYSICAL EXAMINATION:  GENERAL:  68 y.o.-year-old patient lying in the bed with no acute distress. obese EYES: Pupils equal, round, reactive to light and accommodation. No scleral icterus. Extraocular muscles intact.  HEENT: Head atraumatic, normocephalic. Oropharynx and nasopharynx clear.  NECK:  Supple, no jugular venous distention. No thyroid enlargement, no tenderness.  LUNGS: distant breath sounds bilaterally, no wheezing, rales,rhonchi or crepitation. No use of accessory muscles of respiration.  CARDIOVASCULAR: S1, S2 normal. No murmurs, rubs, or gallops.  ABDOMEN: Soft, non-tender, non-distended. Bowel sounds present. No organomegaly or mass.  EXTREMITIES: No pedal edema, cyanosis, or clubbing.  NEUROLOGIC: Cranial nerves II through XII are intact. Muscle strength 5/5 in  all extremities. Sensation intact. Gait not checked.  PSYCHIATRIC: The patient is alert and oriented x 3.  SKIN: No obvious rash, lesion, or ulcer.   DATA REVIEW:   CBC  Recent Labs  Lab 04/04/18 0535  WBC 5.6  HGB 12.3  HCT 38.6  PLT 277    Chemistries  Recent Labs  Lab 04/04/18 0535  NA 139  K 4.3  CL 107  CO2 22  GLUCOSE 195*  BUN 14  CREATININE 0.73  CALCIUM 8.8*  AST 25  ALT 14  ALKPHOS 63  BILITOT 0.3    Microbiology Results   Recent Results (from the past 240 hour(s))  Group A Strep by PCR     Status: None   Collection Time: 04/03/18  6:04 PM  Result Value Ref Range Status   Group A Strep by PCR NOT DETECTED NOT DETECTED Final    Comment: Performed at [facility], [address] [place], [place] [address]   Blood culture (routine x 2)     Status: None (Preliminary result)   Collection Time: 04/03/18  7:13 PM  Result Value Ref Range Status   Specimen Description BLOOD BLOOD LEFT HAND  Final   Special Requests   Final    BOTTLES DRAWN AEROBIC AND ANAEROBIC Blood Culture adequate volume   Culture   Final    NO GROWTH < 12 HOURS Performed at [facility], [address] [place], [place] [address]    Report Status PENDING  Incomplete  Blood culture (routine x 2)     Status: None (Preliminary result)   Collection Time: 04/03/18  7:13 PM  Result Value Ref Range Status   Specimen Description BLOOD BLOOD RIGHT HAND  Final   Special Requests   Final    BOTTLES DRAWN AEROBIC AND ANAEROBIC Blood Culture adequate volume   Culture   Final    NO GROWTH < 12 HOURS Performed at [facility], [address], [place], [place] [address]    Report Status PENDING  Incomplete    RADIOLOGY:  Dg Chest 2 View  Result Date: 04/03/2018 CLINICAL DATA:  Cough and sore throat for 2 days.  Swelling in legs. EXAM: CHEST - 2 VIEW COMPARISON:  One-view chest x-ray 12/03/2017 FINDINGS: The heart is upper limits of normal for size. Mild hyperinflation is stable. New right middle lobe airspace disease is present. No focal airspace disease is present in the left. Visualized soft tissues and bony thorax are unremarkable. Implanted loop recorder is again noted. IMPRESSION: 1. New right middle lobe airspace disease/pneumonia. 2. Otherwise stable hyperinflation without other focal airspace disease. Electronically Signed   By: Marin Roberts M.D.   On: 04/03/2018 18:42     CODE STATUS:     Code Status Orders  (From admission, onward)         Start     Ordered   04/03/18 2227  Full code  Continuous     04/03/18 2226        Code Status History    Date Active Date Inactive Code Status Order ID Comments User Context   12/03/2017 1950 12/08/2017 1713 Full Code [id number]  Milagros Loll, MD ED    11/25/2017 2317 11/28/2017 1954 Full Code [id number]  Pearson Grippe, MD ED   06/24/2017 0223 06/25/2017 1821 Full Code [id number]  Barbaraann Rondo, MD Inpatient   04/12/2017 1433 04/15/2017 1655 Full Code [id number]  Alford Highland, MD ED   03/20/2016 1750 03/23/2016 2147 Full Code [id number]  Floreen Comber, RN Inpatient   05/29/2015 1456 06/03/2015 1738 Full Code [id number]  Elliot Cousin, MD ED      TOTAL TIME TAKING CARE OF THIS PATIENT: *40* minutes.    Enedina Finner M.D on 04/05/2018 at 8:44 AM  Between 7am to 6pm - Pager - [phone] After 6pm go to [email or web address] - Social research officer, government  [facility]  Office  [phone]  CC: Primary care physician; McLean-Scocuzza, Pasty Spillers, MD

## 2018-04-05 NOTE — Progress Notes (Signed)
Patient discharged home per MD order. Prescriptions given to patient. All discharge instructions given and all questions answered. 

## 2018-04-07 ENCOUNTER — Telehealth: Payer: Self-pay | Admitting: Internal Medicine

## 2018-04-07 NOTE — Telephone Encounter (Signed)
Transition Care Management Follow-up Telephone Call  How have you been since you were released from the hospital?    Do you understand why you were in the hospital? yes   Do you understand the discharge instrcutions? yes  Items Reviewed:  Medications reviewed: yes  Allergies reviewed: yes  Dietary changes reviewed: yes  Referrals reviewed: yes   Functional Questionnaire:   Activities of Daily Living (ADLs):   She states they are independent in the following: ambulation, bathing and hygiene, feeding, continence, grooming, toileting and dressing States they require assistance with the following: No assistance required.   Any transportation issues/concerns?: no   Any patient concerns? no   Confirmed importance and date/time of follow-up visits scheduled: yes   Confirmed with patient if condition begins to worsen call PCP or go to the ER.  Patient was given the Call-a-Nurse line [phone]: yes

## 2018-04-08 LAB — CULTURE, BLOOD (ROUTINE X 2)
Culture: NO GROWTH
Culture: NO GROWTH
Special Requests: ADEQUATE
Special Requests: ADEQUATE

## 2018-04-19 ENCOUNTER — Ambulatory Visit (INDEPENDENT_AMBULATORY_CARE_PROVIDER_SITE_OTHER): Payer: Medicare Other | Admitting: Internal Medicine

## 2018-04-19 ENCOUNTER — Telehealth: Payer: Self-pay

## 2018-04-19 ENCOUNTER — Encounter: Payer: Self-pay | Admitting: Internal Medicine

## 2018-04-19 ENCOUNTER — Other Ambulatory Visit: Payer: Self-pay

## 2018-04-19 ENCOUNTER — Ambulatory Visit (INDEPENDENT_AMBULATORY_CARE_PROVIDER_SITE_OTHER): Payer: Medicare Other | Admitting: *Deleted

## 2018-04-19 VITALS — BP 110/70 | HR 62 | Temp 97.5°F | Ht 61.0 in | Wt 215.0 lb

## 2018-04-19 DIAGNOSIS — R600 Localized edema: Secondary | ICD-10-CM

## 2018-04-19 DIAGNOSIS — I10 Essential (primary) hypertension: Secondary | ICD-10-CM

## 2018-04-19 DIAGNOSIS — I639 Cerebral infarction, unspecified: Secondary | ICD-10-CM

## 2018-04-19 DIAGNOSIS — J441 Chronic obstructive pulmonary disease with (acute) exacerbation: Secondary | ICD-10-CM

## 2018-04-19 DIAGNOSIS — R7303 Prediabetes: Secondary | ICD-10-CM

## 2018-04-19 DIAGNOSIS — R739 Hyperglycemia, unspecified: Secondary | ICD-10-CM | POA: Diagnosis not present

## 2018-04-19 DIAGNOSIS — J181 Lobar pneumonia, unspecified organism: Secondary | ICD-10-CM

## 2018-04-19 DIAGNOSIS — Z87898 Personal history of other specified conditions: Secondary | ICD-10-CM

## 2018-04-19 DIAGNOSIS — J449 Chronic obstructive pulmonary disease, unspecified: Secondary | ICD-10-CM

## 2018-04-19 DIAGNOSIS — J189 Pneumonia, unspecified organism: Secondary | ICD-10-CM

## 2018-04-19 LAB — POCT GLYCOSYLATED HEMOGLOBIN (HGB A1C): Hemoglobin A1C: 5.9 % — AB (ref 4.0–5.6)

## 2018-04-19 NOTE — Progress Notes (Signed)
Pre visit review using our clinic review tool, if applicable. No additional management support is needed unless otherwise documented below in the visit note. 

## 2018-04-19 NOTE — Progress Notes (Signed)
Chief Complaint  Patient presents with  . Follow-up   HFU with daughter in [place] 3/2-04/05/2018   for pneumonia right mid lobe and copd flare doing well O2 today w/o O2 92% and at home 96/97% Trelegy worked for copd but cost was expensive will check into pt asst program for medication Trelegy. She has not smoked since 11/2017 btu has been around 2nd hand smoke lung MD appt in 4 months   Elevated glucose A1C 5.9 today   Leg edema better on hctz 12.5 mg qd   Review of Systems  Constitutional: Negative for weight loss.  HENT: Negative for hearing loss.   Eyes: Negative for blurred vision.  Respiratory: Negative for shortness of breath.   Cardiovascular: Negative for chest pain and leg swelling.  Gastrointestinal: Negative for abdominal pain.  Musculoskeletal: Negative for falls.  Skin: Negative for rash.  Neurological: Negative for headaches.  Psychiatric/Behavioral: Negative for depression.   Past Medical History:  Diagnosis Date  . Asthma   . Cataract   . COPD (chronic obstructive pulmonary disease) ([place])   . Cryptogenic Stroke ([place])    a. 03/2016 L ACA, L MCX, L PCA, and R dorsal pontine territory infarcts; b. 03/2016 Carotid U/S: 1-39% bil dzs; c. 03/2016 TEE: EF 60-65%, no rwma, no LA/LAA/RA/RAA thrombus. No PFO; d. 03/2016 s/p MDT Reveal Linq (ser# [id number]) - no AFib noted to date (some sinus w/ freq PACs).  . Dementia ([place])   . Diastolic dysfunction    a. 03/2016 Echo: EF 60-65%, gr1 DD, nl RV fxn.  Marland Kitchen GERD (gastroesophageal reflux disease)   . Hypertension   . Smoker   . Vitamin D deficiency    Past Surgical History:  Procedure Laterality Date  . ABDOMINAL HYSTERECTOMY     partial   . CESAREAN SECTION  1971/1976  . HERNIA REPAIR  2013  . LOOP RECORDER INSERTION N/A 03/23/2016   Procedure: Loop Recorder Insertion;  Surgeon: Evans Lance, MD;  Location: [facility];  Service: Cardiovascular;  [name];  . PARTIAL HYMENECTOMY    . TEE WITHOUT CARDIOVERSION  N/A 03/23/2016   Procedure: TRANSESOPHAGEAL ECHOCARDIOGRAM (TEE);  Surgeon: Skeet Latch, MD;  Location: [facility] ENDOSCOPY;  Service: Cardiovascular;  [name];   Family History  Problem Relation Age of Onset  . Diabetes Mother   . Hypertension Mother   . Miscarriages / [place] Mother   . Hyperlipidemia Mother   . Heart disease Father   . Diabetes Father   . Hypertension Father   . Hyperlipidemia Father   . Stroke Father   . Cancer Sister        skin  . Diabetes Other   . Hyperlipidemia Daughter    Social History   Socioeconomic History  . Marital status: Widowed    Spouse name: Not on file  . Number of children: Not on file  . Years of education: Not on file  . Highest education level: Not on file  Occupational History  . Not on file  Social Needs  . Financial resource strain: Not hard at all  . Food insecurity:    Worry: Never true    Inability: Never true  . Transportation needs:    Medical: No    Non-medical: No  Tobacco Use  . Smoking status: Former Smoker    Packs/day: 1.00    Years: 48.00    Pack years: 48.00    Types: Cigarettes    Last attempt to quit: 12/02/2017    Years  since quitting: 0.3  . Smokeless tobacco: Former Systems developer    Quit date: 05/29/2015  Substance and Sexual Activity  . Alcohol use: Yes    Alcohol/week: 0.0 standard drinks    Comment: occasionally  . Drug use: No  . Sexual activity: Not Currently    Birth control/protection: None  Lifestyle  . Physical activity:    Days per week: 0 days    Minutes per session: 0 min  . Stress: Not at all  Relationships  . Social connections:    Talks on phone: Once a week    Gets together: Once a week    Attends religious service: Never    Active member of club or organization: No    Attends meetings of clubs or organizations: Never    Relationship status: Widowed  . Intimate partner violence:    Fear of current or ex partner: No    Emotionally abused: No    Physically abused: No     Forced sexual activity: No  Other Topics Concern  . 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 albuterol (PROVENTIL HFA;[name] [name]  Marland Kitchen atorvastatin (LIPITOR) 40 MG tablet [name]  . azithromycin (ZITHROMAX) 500 MG tablet Take 1 tablet (500 mg total) by mouth daily.  Marland Kitchen donepezil (ARICEPT) 5 MG tablet [name]  Marland Kitchen ezetimibe (ZETIA) 10 MG tablet Take 1 tablet (10 mg total) by mouth daily.  . Fluticasone-Umeclidin-Vilant (TRELEGY ELLIPTA) 100-62.5-25 MCG/INH AEPB Inhale 1 puff into the lungs daily. Rinse mouth  . guaiFENesin (MUCINEX) 600 MG 12 hr tablet Take 1 tablet (600 mg total) by mouth 2 (two) times daily.  . hydrochlorothiazide (MICROZIDE) 12.5 MG capsule Take 1 capsule (12.5 mg total) by mouth daily.  Marland Kitchen ipratropium-albuterol (DUONEB) 0.5-2.5 (3) MG/3ML SOLN [name] (Patient taking differently: Take 3 mLs by nebulization every 6 (six) hours as needed (for wheezing/shortness of breath). )  . lisinopril (PRINIVIL,[name] [name] (Patient taking differently: Take 20 mg by mouth daily. )   Allergies  Allergen Reactions  . Prednisone Rash and Other (See Comments)    Arrhythmia, also (Has tolerated hydrocortisone and decadron)    Recent Results (from the past 2160 hour(s))  CUP PACEART REMOTE DEVICE CHECK     Status: None   Collection Time: 02/13/18 11:05 AM  Result Value Ref Range   Date Time Interrogation Session [phone]    Pulse Generator Manufacturer MERM    Pulse Gen Model  [id number] Reveal LINQ    Pulse Gen Serial Number [id number]    Clinic Name [facility]    Implantable Pulse Generator Type ICM/ILR    Implantable Pulse Generator Implant Date [id number]   CUP PACEART REMOTE DEVICE CHECK     Status: None   Collection Time: 03/17/18 12:41 PM  Result Value Ref Range   Date Time Interrogation Session [id number]  Pulse Generator Manufacturer MERM    Pulse Gen Model [id number] Reveal LINQ    Pulse Gen Serial Number [id number]    Clinic Name [facility]    Implantable Pulse Generator Type ICM/ILR    Implantable Pulse Generator Implant Date [id number]   Group A Strep by PCR     Status: None   Collection Time: 04/03/18  6:04 PM  Result Value Ref Range   Group A Strep by PCR NOT DETECTED NOT DETECTED    Comment: Performed at [facility], [address] [place], [place] [address]  CBC     Status: None   Collection Time: 04/03/18  7:13 PM  Result Value Ref Range   WBC 6.5 4.0 - 10.5 K/uL   RBC 4.54 3.87 - 5.11 MIL/uL   Hemoglobin 12.9 12.0 - 15.0 g/dL   HCT 39.8 36.0 - 46.0 %   MCV 87.7 80.0 - 100.0 fL   MCH 28.4 26.0 - 34.0 pg   MCHC 32.4 30.0 - 36.0 g/dL   RDW 12.9 11.5 - 15.5 %   Platelets 282 150 - 400 K/uL   nRBC 0.0 0.0 - 0.2 %    Comment: Performed at [facility], [address] [place], [place] [address]  Comprehensive metabolic panel     Status: Abnormal   Collection Time: 04/03/18  7:13 PM  Result Value Ref Range   Sodium 139 135 - 145 mmol/L   Potassium 4.0 3.5 - 5.1 mmol/L   Chloride 106 98 - 111 mmol/L   CO2 24 22 - 32 mmol/L   Glucose, Bld 98 70 - 99 mg/dL   BUN 16 8 - 23 mg/dL   Creatinine, Ser 0.88 0.44 - 1.00 mg/dL   Calcium 8.6 (L) 8.9 - 10.3 mg/dL   Total Protein 7.1 6.5 - 8.1 g/dL   Albumin 3.7 3.5 - 5.0 g/dL   AST 23 15 - 41 U/L   ALT 15 0 - 44 U/L   Alkaline Phosphatase 69 38 - 126 U/L   Total Bilirubin 0.2 (L) 0.3 - 1.2 mg/dL   GFR calc non Af Amer >60 >60 mL/min   GFR calc Af Amer >60 >60  mL/min   Anion gap 9 5 - 15    Comment: Performed at [facility], [address] [place], [place] [address]  Blood culture (routine x 2)     Status: None   Collection Time: 04/03/18  7:13 PM  Result Value Ref Range   Specimen Description BLOOD BLOOD LEFT HAND    Special Requests      BOTTLES DRAWN AEROBIC AND ANAEROBIC Blood Culture adequate volume   Culture      NO GROWTH 5 DAYS Performed at [facility], [address] [place], [place] [address]    Report Status 04/08/2018 FINAL   Blood culture (routine x 2)     Status: None   Collection Time: 04/03/18  7:13 PM  Result Value Ref Range   Specimen Description BLOOD BLOOD RIGHT HAND    Special Requests      BOTTLES DRAWN AEROBIC AND ANAEROBIC Blood Culture adequate volume   Culture      NO GROWTH 5 DAYS Performed at [facility], [address] [place],  [address]    Report Status 04/08/2018 FINAL   CBC     Status: None   Collection Time: 04/04/18  5:35 AM  Result Value Ref Range   WBC 5.6 4.0 - 10.5 K/uL  RBC 4.41 3.87 - 5.11 MIL/uL   Hemoglobin 12.3 12.0 - 15.0 g/dL   HCT 38.6 36.0 - 46.0 %   MCV 87.5 80.0 - 100.0 fL   MCH 27.9 26.0 - 34.0 pg   MCHC 31.9 30.0 - 36.0 g/dL   RDW 12.9 11.5 - 15.5 %   Platelets 277 150 - 400 K/uL   nRBC 0.0 0.0 - 0.2 %    Comment: Performed at [facility], [address] [place], [place] [address]  Comprehensive metabolic panel     Status: Abnormal   Collection Time: 04/04/18  5:35 AM  Result Value Ref Range   Sodium 139 135 - 145 mmol/L   Potassium 4.3 3.5 - 5.1 mmol/L   Chloride 107 98 - 111 mmol/L   CO2 22 22 - 32 mmol/L   Glucose, Bld 195 (H) 70 - 99 mg/dL   BUN 14 8 - 23 mg/dL   Creatinine, Ser 0.73 0.44 - 1.00 mg/dL   Calcium 8.8 (L) 8.9 - 10.3 mg/dL   Total Protein 6.9 6.5 - 8.1 g/dL   Albumin 3.4 (L) 3.5 - 5.0 g/dL   AST 25 15 - 41 U/L   ALT 14 0 - 44 U/L   Alkaline Phosphatase 63 38 - 126 U/L   Total Bilirubin 0.3 0.3 -  1.2 mg/dL   GFR calc non Af Amer >60 >60 mL/min   GFR calc Af Amer >60 >60 mL/min   Anion gap 10 5 - 15    Comment: Performed at [facility], [address] [place], [place] [address]  Protime-INR     Status: None   Collection Time: 04/04/18  5:35 AM  Result Value Ref Range   Prothrombin Time 13.1 11.4 - 15.2 seconds   INR 1.0 0.8 - 1.2    Comment: (NOTE) INR goal varies based on device and disease states. Performed at [facility], [address] [place], [place] [address]   APTT     Status: None   Collection Time: 04/04/18  5:35 AM  Result Value Ref Range   aPTT 36 24 - 36 seconds    Comment: Performed at [facility], [address] [place], [place] [address]  Procalcitonin - Baseline     Status: None   Collection Time: 04/04/18  5:35 AM  Result Value Ref Range   Procalcitonin <0.10 ng/mL    Comment:        Interpretation: PCT (Procalcitonin) <= 0.5 ng/mL: Systemic infection (sepsis) is not likely. Local bacterial infection is possible. (NOTE)       Sepsis PCT Algorithm           Lower Respiratory Tract                                      Infection PCT Algorithm    ----------------------------     ----------------------------         PCT < 0.25 ng/mL                PCT < 0.10 ng/mL         Strongly encourage             Strongly discourage   discontinuation of antibiotics    initiation of antibiotics    ----------------------------     -----------------------------       PCT 0.25 - 0.50 ng/mL  PCT 0.10 - 0.25 ng/mL               OR       >80% decrease in PCT            Discourage initiation of                                            antibiotics      Encourage discontinuation           of antibiotics    ----------------------------     -----------------------------         PCT >= 0.50 ng/mL              PCT 0.26 - 0.50 ng/mL               AND        <80% decrease in PCT             Encourage initiation of                                              antibiotics       Encourage continuation           of antibiotics    ----------------------------     -----------------------------        PCT >= 0.50 ng/mL                  PCT > 0.50 ng/mL               AND         increase in PCT                  Strongly encourage                                      initiation of antibiotics    Strongly encourage escalation           of antibiotics                                     -----------------------------                                           PCT <= 0.25 ng/mL                                                 OR                                        > 80% decrease in PCT  Discontinue / Do not initiate                                             antibiotics Performed at [facility], [address] [place], [place] [address]   Glucose, capillary     Status: Abnormal   Collection Time: 04/04/18  8:11 AM  Result Value Ref Range   Glucose-Capillary 165 (H) 70 - 99 mg/dL   Comment 1 Notify RN   POCT HgB A1C     Status: Abnormal   Collection Time: 04/19/18 11:40 AM  Result Value Ref Range   Hemoglobin A1C 5.9 (A) 4.0 - 5.6 %   HbA1c POC (<> result, manual entry)     HbA1c, POC (prediabetic range)     HbA1c, POC (controlled diabetic range)     Objective  Body mass index is 40.62 kg/m. Wt Readings from Last 3 Encounters:  04/19/18 215 lb (97.5 kg)  04/03/18 190 lb (86.2 kg)  12/26/17 205 lb (93 kg)   Temp Readings from Last 3 Encounters:  04/19/18 (!) 97.5 F (36.4 C) (Oral)  04/05/18 98.4 F (36.9 C) (Oral)  12/21/17 (!) 97.5 F (36.4 C) (Oral)   BP Readings from Last 3 Encounters:  04/19/18 110/70  04/05/18 (!) 153/96  12/26/17 102/70   Pulse Readings from Last 3 Encounters:  04/19/18 62  04/05/18 65  12/26/17 65    Physical Exam Vitals signs and nursing note reviewed.  Constitutional:      Appearance: Normal appearance. She is  well-developed and well-groomed. She is obese.  HENT:     Head: Normocephalic and atraumatic.     Nose: Nose normal.     Mouth/Throat:     Mouth: Mucous membranes are moist.     Pharynx: Oropharynx is clear.  Eyes:     Conjunctiva/sclera: Conjunctivae normal.     Pupils: Pupils are equal, round, and reactive to light.  Cardiovascular:     Rate and Rhythm: Normal rate and regular rhythm.     Heart sounds: Normal heart sounds.  Pulmonary:     Effort: Pulmonary effort is normal.     Breath sounds: Wheezing present.     Comments: Ant wheezing b/l lungs  Musculoskeletal:     Right lower leg: No edema.     Left lower leg: No edema.  Skin:    General: Skin is warm and dry.  Neurological:     General: No focal deficit present.     Mental Status: She is alert and oriented to person, place, and time. Mental status is at baseline.     Gait: Gait normal.  Psychiatric:        Attention and Perception: Attention and perception normal.        Mood and Affect: Mood and affect normal.        Speech: Speech normal.        Behavior: Behavior normal. Behavior is cooperative.        Thought Content: Thought content normal.        Cognition and Memory: Cognition and memory normal.        Judgment: Judgment normal.     Assessment   1. Copd exacerbation resolved with RML pneumonia  2. Prediabetes  3. HTN controlled and leg edema resolved  4. HM Plan  1.  Will try to get trelegy approved pt  asst program given 1 sample today and coupon Repeat CXR y 05/30/2018  F/u lung md in 4 months  2. A1C poc 5.9 today  3. Cont meds lis 20 and hctz 12.5 mg qd  4 Flu shot not had for this year  Had pna 23 prevnar due 04/16/2018 will give in future  utd tdap  Declines shingrix and MMR check   Disc mammo again today pt declines  Referred cologaurd pt has not done  -reassess at f/u  Declines derm  S/p partial hysterectomy  DEXA 07/29/15 osteopenia  lipid, TSH, vitamin D to at f/u   Provider: Dr.  Olivia Mackie McLean-Scocuzza-Internal Medicine

## 2018-04-19 NOTE — Patient Instructions (Signed)
Please come in for chest Xray 05/30/2018 here In the office    Community-Acquired Pneumonia, Adult Pneumonia is an infection of the lungs. There are different types of pneumonia. One type can develop while a person is in a hospital. A different type, called community-acquired pneumonia, develops in people who are not, or have not recently been, in the hospital or other health care facility. What are the causes?  Pneumonia may be caused by bacteria, viruses, or funguses. Community-acquired pneumonia is often caused by Streptococcus pneumonia bacteria. These bacteria are often passed from one person to another by breathing in droplets from the cough or sneeze of an infected person. What increases the risk? The condition is more likely to develop in:  People who havechronic diseases, such as chronic obstructive pulmonary disease (COPD), asthma, congestive heart failure, cystic fibrosis, diabetes, or kidney disease.  People who haveearly-stage or late-stage HIV.  People who havesickle cell disease.  People who havehad their spleen removed (splenectomy).  People who havepoor Administrator.  People who havemedical conditions that increase the risk of breathing in (aspirating) secretions their own mouth and nose.  People who havea weakened immune system (immunocompromised).  People who smoke.  People whotravel to areas where pneumonia-causing germs commonly exist.  People whoare around animal habitats or animals that have pneumonia-causing germs, including birds, bats, rabbits, cats, and farm animals. What are the signs or symptoms? Symptoms of this condition include:  Adry cough.  A wet (productive) cough.  Fever.  Sweating.  Chest pain, especially when breathing deeply or coughing.  Rapid breathing or difficulty breathing.  Shortness of breath.  Shaking chills.  Fatigue.  Muscle aches. How is this diagnosed? Your health care provider will take a medical  history and perform a physical exam. You may also have other tests, including:  Imaging studies of your chest, including X-rays.  Tests to check your blood oxygen level and other blood gases.  Other tests on blood, mucus (sputum), fluid around your lungs (pleural fluid), and urine. If your pneumonia is severe, other tests may be done to identify the specific cause of your illness. How is this treated? The type of treatment that you receive depends on many factors, such as the cause of your pneumonia, the medicines you take, and other medical conditions that you have. For most adults, treatment and recovery from pneumonia may occur at home. In some cases, treatment must happen in a hospital. Treatment may include:  Antibiotic medicines, if the pneumonia was caused by bacteria.  Antiviral medicines, if the pneumonia was caused by a virus.  Medicines that are given by mouth or through an IV tube.  Oxygen.  Respiratory therapy. Although rare, treating severe pneumonia may include:  Mechanical ventilation. This is done if you are not breathing well on your own and you cannot maintain a safe blood oxygen level.  Thoracentesis. This procedureremoves fluid around one lung or both lungs to help you breathe better. Follow these instructions at home:   Take over-the-counter and prescription medicines only as told by your health care provider. ? Only takecough medicine if you are losing sleep. Understand that cough medicine can prevent your body's natural ability to remove mucus from your lungs. ? If you were prescribed an antibiotic medicine, take it as told by your health care provider. Do not stop taking the antibiotic even if you start to feel better.  Sleep in a semi-upright position at night. Try sleeping in a reclining chair, or place a few pillows under  your head.  Do not use tobacco products, including cigarettes, chewing tobacco, and e-cigarettes. If you need help quitting, ask your  health care provider.  Drink enough water to keep your urine clear or pale yellow. This will help to thin out mucus secretions in your lungs. How is this prevented? There are ways that you can decrease your risk of developing community-acquired pneumonia. Consider getting a pneumococcal vaccine if:  You are older than 67 years of age.  You are older than 67 years of age and are undergoing cancer treatment, have chronic lung disease, or have other medical conditions that affect your immune system. Ask your health care provider if this applies to you. There are different types and schedules of pneumococcal vaccines. Ask your health care provider which vaccination option is best for you. You may also prevent community-acquired pneumonia if you take these actions:  Get an influenza vaccine every year. Ask your health care provider which type of influenza vaccine is best for you.  Go to the dentist on a regular basis.  Wash your hands often. Use hand sanitizer if soap and water are not available. Contact a health care provider if:  You have a fever.  You are losing sleep because you cannot control your cough with cough medicine. Get help right away if:  You have worsening shortness of breath.  You have increased chest pain.  Your sickness becomes worse, especially if you are an older adult or have a weakened immune system.  You cough up blood. This information is not intended to replace advice given to you by your health care provider. Make sure you discuss any questions you have with your health care provider. Document Released: 01/18/2005 Document Revised: 10/07/2016 Document Reviewed: 05/15/2014 Elsevier Interactive Patient Education  2019 [employer].

## 2018-04-19 NOTE — Telephone Encounter (Signed)
Call placed to Pt.  Advised at this time non urgent office visits were being cancelled d/t Covid-19 concerns.  Advised OV has been cancelled.  Per review of recent hospitalization-no need to follow up with Dr. Ladona Ridgel at this time.  Continue to follow remotely.   Pt and daughter indicate understanding.  Advised to call office if any needs.

## 2018-04-20 LAB — CUP PACEART REMOTE DEVICE CHECK
Date Time Interrogation Session: 20200318124138
Implantable Pulse Generator Implant Date: 20180220

## 2018-04-21 ENCOUNTER — Ambulatory Visit: Payer: Medicare Other | Admitting: Internal Medicine

## 2018-04-24 ENCOUNTER — Telehealth: Payer: Self-pay | Admitting: Pharmacist

## 2018-04-24 NOTE — Telephone Encounter (Signed)
Received message from Dr. Judie Grieve regarding potential patient assistance for Trelegy for this patient.   Contacted patient to screen for eligibility. Left HIPAA compliant message for her to return my call at her convenience.   Catie Feliz Beam, PharmD, CPP PGY2 Ambulatory Care Pharmacy Resident, [facility] Phone: [phone]

## 2018-04-26 ENCOUNTER — Ambulatory Visit: Payer: Medicare Other | Admitting: Internal Medicine

## 2018-04-27 ENCOUNTER — Encounter: Payer: Self-pay | Admitting: Cardiology

## 2018-04-27 NOTE — Progress Notes (Signed)
Carelink Summary Report / Loop Recorder 

## 2018-04-27 NOTE — Progress Notes (Signed)
Letter not needed 

## 2018-04-27 NOTE — Telephone Encounter (Signed)
Contacted patient, spoke with her daughter, Lawerance Bach (on [place]). She explained that she was originally under the impression that once a deductible was met, the Trelegy would be $100/month for her mother, however, the most recent copay was $169 and this is not something they can afford.   Research officer, trade union. Confirmed that they do have the patient's [place] prescription information on file and are using this. Per the [place] website, it does appear that [facility] pharmacies are one of the preferred options.   Contacted [place] patient assistance. Unfortunately, was informed that patients with any VA coverage are ineligible for the patient assistance program.   Plan to call the [place] program with the patient's card information to determine if there are more affordable options. Unfortunately, patient's daughter did not have the card, but would have it tomorrow. I will call back tomorrow to get the information, and then contact the [place], PharmD, [place] PGY2 Ambulatory Care Pharmacy Resident, [place] Phone: [phone]

## 2018-05-01 ENCOUNTER — Telehealth: Payer: Self-pay | Admitting: Pharmacist

## 2018-05-01 ENCOUNTER — Other Ambulatory Visit: Payer: Self-pay | Admitting: Internal Medicine

## 2018-05-01 DIAGNOSIS — I10 Essential (primary) hypertension: Secondary | ICD-10-CM

## 2018-05-01 DIAGNOSIS — J449 Chronic obstructive pulmonary disease, unspecified: Secondary | ICD-10-CM

## 2018-05-01 MED ORDER — FLUTICASONE-UMECLIDIN-VILANT 100-62.5-25 MCG/INH IN AEPB: 1.0000 | INHALATION_SPRAY | Freq: Every day | RESPIRATORY_TRACT | 4 refills | Status: DC

## 2018-05-01 MED ORDER — HYDROCHLOROTHIAZIDE 12.5 MG PO CAPS: 12.5000 mg | ORAL_CAPSULE | Freq: Every day | ORAL | 3 refills | Status: DC

## 2018-05-01 NOTE — Telephone Encounter (Signed)
Contacted [facility] pharmacy to inquire about alternative options for inhaler therapy, as no formulary exists for [facility] coverage. They informed me of the [facility] program that [facility] beneficiaries are eligible for, and would receive medications that they have in stock for $0.  Contacted this program (phone [phone]) and provided patient information. They do carry Trelegy and accept electronic prescriptions, and have confirmed that Trelegy would be $0 if received through this pharmacy. I have added the correct pharmacy to the patient's list of preferred pharmacies. Dr. Judie Grieve, could you send the Trelegy to [facility]?   They confirmed the correct shipping address on file for the patient. I contacted the patient's daughter, Wyatt Mage, to inform her of this. She noted that she remembered her father in law receiving most of his medications from there due to it being cheaper. She would like to get the Trelegy first to see the process, and potentially have all other medications filled there in the future. Additionally, she asks that a refill on HCTZ be sent in to the local [facility], as the patient is almost out of the supply that she was given on discharge from [facility] on 04/05/2018.  I have asked Tabitha to give me a call when she receives the Trelegy in the mail.  Catie Feliz Beam, PharmD, CPP PGY2 Ambulatory Care Pharmacy Resident, [facility] Phone: [phone]

## 2018-05-01 NOTE — Telephone Encounter (Signed)
Sent 3 month supply of trelegy and hctz [facility]  Thanks for your help

## 2018-05-08 ENCOUNTER — Telehealth: Payer: Self-pay

## 2018-05-08 ENCOUNTER — Other Ambulatory Visit: Payer: Self-pay | Admitting: Internal Medicine

## 2018-05-08 DIAGNOSIS — E785 Hyperlipidemia, unspecified: Secondary | ICD-10-CM

## 2018-05-08 DIAGNOSIS — R413 Other amnesia: Secondary | ICD-10-CM

## 2018-05-08 DIAGNOSIS — I10 Essential (primary) hypertension: Secondary | ICD-10-CM

## 2018-05-08 DIAGNOSIS — J449 Chronic obstructive pulmonary disease, unspecified: Secondary | ICD-10-CM

## 2018-05-08 MED ORDER — EZETIMIBE 10 MG PO TABS: 10.0000 mg | ORAL_TABLET | Freq: Every day | ORAL | 3 refills | Status: DC

## 2018-05-08 MED ORDER — DONEPEZIL HCL 5 MG PO TABS: 5.0000 mg | ORAL_TABLET | Freq: Every day | ORAL | 3 refills | Status: DC

## 2018-05-08 MED ORDER — IPRATROPIUM-ALBUTEROL 0.5-2.5 (3) MG/3ML IN SOLN: 3.0000 mL | Freq: Four times a day (QID) | RESPIRATORY_TRACT | 4 refills | Status: DC | PRN

## 2018-05-08 MED ORDER — HYDROCHLOROTHIAZIDE 12.5 MG PO CAPS: 12.5000 mg | ORAL_CAPSULE | Freq: Every day | ORAL | 3 refills | Status: DC

## 2018-05-08 MED ORDER — LISINOPRIL 20 MG PO TABS: 20.0000 mg | ORAL_TABLET | Freq: Every day | ORAL | 3 refills | Status: DC

## 2018-05-08 MED ORDER — ALBUTEROL SULFATE HFA 108 (90 BASE) MCG/ACT IN AERS: 1.0000 | INHALATION_SPRAY | Freq: Four times a day (QID) | RESPIRATORY_TRACT | 4 refills | Status: DC | PRN

## 2018-05-08 MED ORDER — ATORVASTATIN CALCIUM 40 MG PO TABS: 40.0000 mg | ORAL_TABLET | Freq: Every day | ORAL | 3 refills | Status: DC

## 2018-05-08 NOTE — Telephone Encounter (Signed)
Copied from CRM [id number]. Topic: General - Other >> May 08, 2018 11:25 AM Jay Schlichter wrote: Reason for CRM: daughter is calling back for pharmacist Katie  Please call back to Cb is [phone]

## 2018-05-08 NOTE — Telephone Encounter (Signed)
Returned Lauren Koch's call. She noted that her mother received a 3 month supply of Trelegy today, and she is extremely grateful for the help.   She requested that all chronic medications be sent to [facility].   Contacted [facility] to ask to transfer all prescriptions from the patient's current [facility]; they noted that they do not transfer prescriptions in from other pharmacies, they require all new prescriptions be e-scribed from the provider.   Will route to PCP Dr. Judie Grieve to have prescriptions sent to [facility] in [place], [place].

## 2018-05-19 ENCOUNTER — Telehealth: Payer: Self-pay

## 2018-05-19 NOTE — Telephone Encounter (Signed)
Left message for patient to remind of missed remote transmission.  

## 2018-05-22 ENCOUNTER — Ambulatory Visit (INDEPENDENT_AMBULATORY_CARE_PROVIDER_SITE_OTHER): Payer: Medicare Other | Admitting: *Deleted

## 2018-05-22 ENCOUNTER — Other Ambulatory Visit: Payer: Self-pay

## 2018-05-22 DIAGNOSIS — I639 Cerebral infarction, unspecified: Secondary | ICD-10-CM

## 2018-05-22 LAB — CUP PACEART REMOTE DEVICE CHECK
Date Time Interrogation Session: 20200420141151
Implantable Pulse Generator Implant Date: 20180220

## 2018-05-31 NOTE — Progress Notes (Signed)
Carelink Summary Report / Loop Recorder 

## 2018-06-16 ENCOUNTER — Telehealth: Payer: Self-pay | Admitting: Internal Medicine

## 2018-06-16 NOTE — Telephone Encounter (Signed)
Patient stated she is not doing cologuard

## 2018-06-16 NOTE — Telephone Encounter (Signed)
Call daughter are they going to do cologuard stool test for colon cancer?  If so the order expired and needs to be re ordered?   TMS

## 2018-06-26 LAB — CUP PACEART REMOTE DEVICE CHECK
Date Time Interrogation Session: 20200523141045
Implantable Pulse Generator Implant Date: 20180220

## 2018-06-27 ENCOUNTER — Ambulatory Visit (INDEPENDENT_AMBULATORY_CARE_PROVIDER_SITE_OTHER): Payer: Medicare Other | Admitting: *Deleted

## 2018-06-27 DIAGNOSIS — I639 Cerebral infarction, unspecified: Secondary | ICD-10-CM | POA: Diagnosis not present

## 2018-06-27 DIAGNOSIS — R55 Syncope and collapse: Secondary | ICD-10-CM

## 2018-07-05 ENCOUNTER — Telehealth: Payer: Self-pay | Admitting: *Deleted

## 2018-07-05 NOTE — Telephone Encounter (Signed)
Copied from CRM [id number]. Topic: Quick Communication - Home Health Verbal Orders >> Jul 05, 2018 10:35 AM Randol Kern wrote: Caller/Agency: Darel Hong, [facility] Callback Number: [phone] ex [phone] Darel Hong will be faxing a clean form that needs to be filled out. She states insurance will not accept a form that has notes on it. Please advise. She is requesting call back from providers to discuss important details needed for form to get approved for oxygen. Fax: [phone]

## 2018-07-05 NOTE — Telephone Encounter (Signed)
Called [facility] # back order for O2 for COPD  should be filled out by her pulmonologist Dr. Sherryle Lis When order received will fax to him phone # [phone] and fax [phone]  TMS

## 2018-07-05 NOTE — Telephone Encounter (Signed)
Contacted Judy with [facility] and requested that oxygen order be faxed to our office.  Judy's contact number is [phone] ext [phone].

## 2018-07-05 NOTE — Telephone Encounter (Signed)
CMN has been received and given to [place].

## 2018-07-05 NOTE — Telephone Encounter (Signed)
Lauren Koch can you take a look at this request for oxygen, please?  Called [employer] [facility] # back order for O2 for COPD  should be filled out by her pulmonologist Dr. Sherryle Lis When order received will fax to him phone # [phone] and fax [phone]  TMS

## 2018-07-06 NOTE — Telephone Encounter (Signed)
CMN has been completed and faxed to [employer].  Received fax confirmation.  Nothing further is needed at this time.

## 2018-07-10 NOTE — Progress Notes (Signed)
Carelink Summary Report / Loop Recorder 

## 2018-07-27 ENCOUNTER — Ambulatory Visit (INDEPENDENT_AMBULATORY_CARE_PROVIDER_SITE_OTHER): Payer: Medicare Other | Admitting: *Deleted

## 2018-07-27 DIAGNOSIS — I639 Cerebral infarction, unspecified: Secondary | ICD-10-CM

## 2018-07-27 LAB — CUP PACEART REMOTE DEVICE CHECK
Date Time Interrogation Session: 20200625131105
Implantable Pulse Generator Implant Date: 20180220

## 2018-08-03 ENCOUNTER — Telehealth: Payer: Self-pay

## 2018-08-03 NOTE — Telephone Encounter (Signed)
Spoke with patient regarding disconnected monitor 

## 2018-08-06 NOTE — Progress Notes (Signed)
Carelink Summary Report / Loop Recorder 

## 2018-08-29 ENCOUNTER — Encounter: Payer: Medicare Other | Admitting: *Deleted

## 2018-09-02 ENCOUNTER — Other Ambulatory Visit: Payer: Self-pay

## 2018-09-02 ENCOUNTER — Emergency Department (HOSPITAL_COMMUNITY): Payer: Medicare Other

## 2018-09-02 ENCOUNTER — Emergency Department (HOSPITAL_COMMUNITY)
Admission: EM | Admit: 2018-09-02 | Discharge: 2018-09-02 | Disposition: A | Discharge status: Home / Self Care | Payer: Medicare Other | Attending: Emergency Medicine | Admitting: Emergency Medicine

## 2018-09-02 ENCOUNTER — Encounter (HOSPITAL_COMMUNITY): Payer: Self-pay | Admitting: Emergency Medicine

## 2018-09-02 DIAGNOSIS — M79651 Pain in right thigh: Secondary | ICD-10-CM | POA: Diagnosis present

## 2018-09-02 DIAGNOSIS — J449 Chronic obstructive pulmonary disease, unspecified: Secondary | ICD-10-CM | POA: Diagnosis not present

## 2018-09-02 DIAGNOSIS — I10 Essential (primary) hypertension: Secondary | ICD-10-CM | POA: Insufficient documentation

## 2018-09-02 DIAGNOSIS — Z79899 Other long term (current) drug therapy: Secondary | ICD-10-CM | POA: Insufficient documentation

## 2018-09-02 DIAGNOSIS — F0390 Unspecified dementia without behavioral disturbance: Secondary | ICD-10-CM | POA: Insufficient documentation

## 2018-09-02 DIAGNOSIS — M1611 Unilateral primary osteoarthritis, right hip: Secondary | ICD-10-CM | POA: Diagnosis not present

## 2018-09-02 DIAGNOSIS — Z8673 Personal history of transient ischemic attack (TIA), and cerebral infarction without residual deficits: Secondary | ICD-10-CM | POA: Insufficient documentation

## 2018-09-02 DIAGNOSIS — Z87891 Personal history of nicotine dependence: Secondary | ICD-10-CM | POA: Diagnosis not present

## 2018-09-02 DIAGNOSIS — M47816 Spondylosis without myelopathy or radiculopathy, lumbar region: Secondary | ICD-10-CM | POA: Diagnosis not present

## 2018-09-02 DIAGNOSIS — M79604 Pain in right leg: Secondary | ICD-10-CM | POA: Diagnosis not present

## 2018-09-02 LAB — URINALYSIS, ROUTINE W REFLEX MICROSCOPIC
Bacteria, UA: NONE SEEN
Bilirubin Urine: NEGATIVE
Glucose, UA: NEGATIVE mg/dL
Hgb urine dipstick: NEGATIVE
Ketones, ur: NEGATIVE mg/dL
Nitrite: NEGATIVE
Protein, ur: NEGATIVE mg/dL
Specific Gravity, Urine: 1.016 (ref 1.005–1.030)
pH: 5 (ref 5.0–8.0)

## 2018-09-02 MED ORDER — CYCLOBENZAPRINE HCL 10 MG PO TABS
10.0000 mg | ORAL_TABLET | Freq: Once | ORAL | Status: AC
  Administered 2018-09-02: 10 mg via ORAL
  Filled 2018-09-02: qty 1

## 2018-09-02 MED ORDER — HYDROCODONE-ACETAMINOPHEN 5-325 MG PO TABS
1.0000 | ORAL_TABLET | Freq: Once | ORAL | Status: AC
  Administered 2018-09-02: 18:00:00 1 via ORAL
  Filled 2018-09-02: qty 1

## 2018-09-02 MED ORDER — ENOXAPARIN SODIUM 100 MG/ML ~~LOC~~ SOLN
1.0000 mg/kg | Freq: Once | SUBCUTANEOUS | Status: AC
  Administered 2018-09-02: 100 mg via SUBCUTANEOUS
  Filled 2018-09-02: qty 1

## 2018-09-02 MED ORDER — HYDROCODONE-ACETAMINOPHEN 5-325 MG PO TABS: 1.0000 | ORAL_TABLET | ORAL | 0 refills | Status: DC | PRN

## 2018-09-02 NOTE — ED Triage Notes (Signed)
Daughter reports pt has been c/o right leg pain in thigh area for several weeks which has worsened. Denies fall.

## 2018-09-02 NOTE — ED Notes (Signed)
Pt asked if she was able to provide a urine sample, stated she doesn't have to go right now.

## 2018-09-02 NOTE — ED Notes (Signed)
Pt ambulatory to bathroom with minimal limp noted.

## 2018-09-02 NOTE — ED Provider Notes (Signed)
[facility] EMERGENCY DEPARTMENT Provider Note   CSN: [id number] Arrival date & time: 09/02/18  1654     History   Chief Complaint Chief Complaint  Patient presents with   Leg Pain    HPI Lauren Koch is a 68 y.o. female.     HPI   Lauren Koch is a 68 y.o. female who presents to the Emergency Department complaining of gradually worsening right thigh pain.  She describes an aching pain from her right groin down to the mid to lower portion of her right anterior thigh.  Pain is worse with weightbearing and with attempting to raise her leg.  She denies known injury or fall.  She took ibuprofen with some relief.  She states the area appears swollen to her.  She denies rash or redness.  She also denies back pain, numbness or weakness of her extremity, abdominal pain, urine or bowel changes, pain, numbness, or weakness distal to the thigh.  She denies chest pain, shortness of breath, or history of DVT.  Past Medical History:  Diagnosis Date   Asthma    Cataract    COPD (chronic obstructive pulmonary disease) ([place])    Cryptogenic Stroke ([place])    a. 03/2016 L ACA, L MCX, L PCA, and R dorsal pontine territory infarcts; b. 03/2016 Carotid U/S: 1-39% bil dzs; c. 03/2016 TEE: EF 60-65%, no rwma, no LA/LAA/RA/RAA thrombus. No PFO; d. 03/2016 s/p MDT Reveal Linq (ser# [id number]) - no AFib noted to date (some sinus w/ freq PACs).   Dementia ([place])    Diastolic dysfunction    a. 03/2016 Echo: EF 60-65%, gr1 DD, nl RV fxn.   GERD (gastroesophageal reflux disease)    Hypertension    Smoker    Vitamin D deficiency     Patient Active Problem List   Diagnosis Date Noted   COPD (chronic obstructive pulmonary disease) with acute bronchitis ([place]) 04/03/2018   Physical deconditioning 12/21/2017   Urinary incontinence 12/21/2017   Acute on chronic respiratory failure with hypoxia ([place]) 11/26/2017   Dyspnea 11/25/2017   Tachycardia 11/25/2017   Syncope 06/24/2017   Memory loss  06/10/2017   Abnormal thyroid function test 06/10/2017   Breast cyst, left 06/10/2017   Hyperlipemia 03/25/2016   Cryptogenic stroke ([place]) - L anterior cirulation and R pontine s/p IV tPA 03/20/2016   Osteopenia 08/08/2015   Pulmonary emphysema ([facility])    Abnormal TSH 05/30/2015   Acute respiratory failure with hypoxia ([place]) 05/29/2015   COPD exacerbation ([place]) 05/29/2015   Acute kidney injury ([place]) 05/29/2015   Morbid obesity ([place]) 05/29/2015   Tobacco abuse 05/29/2015   Right leg pain 05/29/2015   Essential hypertension 05/29/2015   Vitamin D deficiency 04/17/2015   Smoker 03/03/2015   Asthma    Cataract    COPD (chronic obstructive pulmonary disease) ([place])    GERD (gastroesophageal reflux disease)     Past Surgical History:  Procedure Laterality Date   ABDOMINAL HYSTERECTOMY     partial    CESAREAN SECTION  1971/1976   HERNIA REPAIR  2013   LOOP RECORDER INSERTION N/A 03/23/2016   Procedure: Loop Recorder Insertion;  Surgeon: Evans Lance, MD;  Location: [place] [facility];  Service: Cardiovascular;  [name];   PARTIAL HYMENECTOMY     TEE WITHOUT CARDIOVERSION N/A 03/23/2016   Procedure: TRANSESOPHAGEAL ECHOCARDIOGRAM (TEE);  Surgeon: Skeet Latch, MD;  Location: [place];  Service: Cardiovascular;  [name];     OB History  Gravida  2   Para  2   Term  2   Preterm      AB      Living  2     SAB      TAB      Ectopic      Multiple      Live Births               Home Medications    Prior to Admission medications   Medication Sig Start Date End Date Taking? Authorizing Provider  albuterol (PROVENTIL HFA;[name] Inhale 1-2 puffs into the lungs every 6 (six) hours as needed for wheezing or shortness of breath. 05/08/18  Yes McLean-Scocuzza, Pasty Spillersracy N, MD  donepezil (ARICEPT) 5 MG tablet [name] 05/08/18  Yes McLean-Scocuzza, Pasty Spillersracy  N, MD  Fluticasone-Umeclidin-Vilant (TRELEGY ELLIPTA) 100-62.5-25 MCG/INH AEPB Inhale 1 puff into the lungs daily. Rinse mouth 05/01/18  Yes McLean-Scocuzza, Pasty Spillersracy N, MD  hydrochlorothiazide (MICROZIDE) 12.5 MG capsule Take 1 capsule (12.5 mg total) by mouth daily. 05/08/18  Yes McLean-Scocuzza, Pasty Spillersracy N, MD  ibuprofen (ADVIL) 200 MG tablet Take 200 mg by mouth every 6 (six) hours as needed for fever or moderate pain.   Yes [provider]  lisinopril (PRINIVIL,[name] Take 1 tablet (20 mg total) by mouth daily. Patient taking differently: Take 10 mg by mouth daily.  05/08/18  Yes McLean-Scocuzza, Pasty Spillersracy N, MD  atorvastatin (LIPITOR) 40 MG tablet [name] Patient not taking: Reported on 09/02/2018 05/08/18   McLean-Scocuzza, Pasty Spillersracy N, MD  ezetimibe (ZETIA) 10 MG tablet Take 1 tablet (10 mg total) by mouth daily. Patient not taking: Reported on 09/02/2018 05/08/18   McLean-Scocuzza, Pasty Spillersracy N, MD  HYDROcodone-acetaminophen (NORCO/VICODIN) 5-325 MG tablet Take 1 tablet by mouth every 4 (four) hours as needed. 09/02/18   Jayshawn Colston, PA-C  ipratropium-albuterol (DUONEB) 0.5-2.5 (3) MG/3ML SOLN Take 3 mLs by nebulization every 6 (six) hours as needed (for wheezing/shortness of breath). 05/08/18   McLean-Scocuzza, Pasty Spillersracy N, MD    Family History Family History  Problem Relation Age of Onset   Diabetes Mother    Hypertension Mother    Miscarriages / [place] Mother    Hyperlipidemia Mother    Heart disease Father    Diabetes Father    Hypertension Father    Hyperlipidemia Father    Stroke Father    Cancer Sister        skin   Diabetes Other    Hyperlipidemia Daughter     Social History Social History   Tobacco Use   Smoking status: Former Smoker    Packs/day: 1.00    Years: 48.00    Pack years: 48.00    Types: Cigarettes    Quit date: 12/02/2017    Years since quitting: 0.7   Smokeless tobacco: Former NeurosurgeonUser    Quit  date: 05/29/2015  Substance Use Topics   Alcohol use: Not Currently    Alcohol/week: 0.0 standard drinks   Drug use: No     Allergies   Prednisone   Review of Systems Review of Systems  Constitutional: Negative for chills and fever.  Respiratory: Negative for shortness of breath.   Cardiovascular: Negative for chest pain.  Gastrointestinal: Negative for abdominal pain, diarrhea, nausea and vomiting.  Genitourinary: Negative for difficulty urinating and dysuria.  Musculoskeletal: Positive for myalgias (Right thigh  pain and swelling). Negative for arthralgias, back pain and joint swelling.  Skin: Negative for color change, rash and wound.  Neurological: Negative for weakness and numbness.     Physical Exam Updated Vital Signs BP (!) 106/58 (BP Location: Left Arm)    Pulse 65    Temp 98 F (36.7 C) (Oral)    Resp 18    Ht 5\' 1"  (1.549 m)    Wt 99.8 kg    SpO2 97%    BMI 41.57 kg/m   Physical Exam Vitals signs and nursing note reviewed.  Constitutional:      General: She is not in acute distress.    Appearance: She is well-developed.  HENT:     Head: Normocephalic and atraumatic.  Neck:     Musculoskeletal: Normal range of motion and neck supple.  Cardiovascular:     Rate and Rhythm: Normal rate and regular rhythm.     Comments: DP pulses are strong and palpable bilaterally Pulmonary:     Effort: Pulmonary effort is normal. No respiratory distress.     Breath sounds: Normal breath sounds.  Abdominal:     General: There is no distension.     Palpations: Abdomen is soft.     Tenderness: There is no abdominal tenderness.  Musculoskeletal:        General: Tenderness present. No swelling or deformity.     Lumbar back: She exhibits tenderness and pain. She exhibits normal range of motion, no swelling, no deformity, no laceration and normal pulse.     Comments: ttp of the anterior upper right thigh.  No appreciable edema, erythema or other skin changes.  Pain is  reproducible with right-sided straight leg raise and internal rotation of the right hip.  No tenderness of lumbar paraspinal muscles or spinal tenderness.  Pt has 5/5 strength against resistance of bilateral lower extremities.     Skin:    General: Skin is warm and dry.     Capillary Refill: Capillary refill takes less than 2 seconds.     Findings: No rash.  Neurological:     General: No focal deficit present.     Mental Status: She is alert.     Sensory: No sensory deficit.     Motor: No weakness or abnormal muscle tone.     Coordination: Coordination normal.     Gait: Gait normal.     Deep Tendon Reflexes:     Reflex Scores:      Patellar reflexes are 2+ on the right side and 2+ on the left side.      Achilles reflexes are 2+ on the right side and 2+ on the left side.     ED Treatments / Results  Labs (all labs ordered are listed, but only abnormal results are displayed) Labs Reviewed  URINALYSIS, ROUTINE W REFLEX MICROSCOPIC - Abnormal; Notable for the following components:      Result Value   Leukocytes,Ua SMALL (*)    All other components within normal limits  URINE CULTURE    EKG None  Radiology Dg Lumbar Spine Complete  Result Date: 09/02/2018 CLINICAL DATA:  Right leg pain EXAM: LUMBAR SPINE - COMPLETE 4+ VIEW COMPARISON:  05/01/15 FINDINGS: There is no evidence of lumbar spine fracture. Alignment is normal. Intervertebral disc spaces are maintained. Mild multi level ventral endplate spurring noted. IMPRESSION: 1. No acute findings. 2. Mild degenerative changes within the lumbar spine. Electronically Signed   By: Signa Kellaylor  Stroud M.D.   On: 09/02/2018 18:29  Dg Hip Unilat W Or Wo Pelvis 2-3 Views Right  Result Date: 09/02/2018 CLINICAL DATA:  Right leg pain EXAM: DG HIP (WITH OR WITHOUT PELVIS) 2-3V RIGHT COMPARISON:  None. FINDINGS: There is no evidence of hip fracture or dislocation. Mild subchondral sclerosis with marginal spur formation is noted in the right hip,  similar to the left hip. IMPRESSION: 1. No acute findings. 2. Mild bilateral hip osteoarthritis. Electronically Signed   By: Signa Kellaylor  Stroud M.D.   On: 09/02/2018 18:31    Procedures Procedures (including critical care time)  Medications Ordered in ED Medications  enoxaparin (LOVENOX) injection 100 mg (has no administration in time range)  HYDROcodone-acetaminophen (NORCO/VICODIN) 5-325 MG per tablet 1 tablet (1 tablet Oral Given 09/02/18 1756)  cyclobenzaprine (FLEXERIL) tablet 10 mg (10 mg Oral Given 09/02/18 1756)     Initial Impression / Assessment and Plan / ED Course  I have reviewed the triage vital signs and the nursing notes.  Pertinent labs & imaging results that were available during my care of the patient were reviewed by me and considered in my medical decision making (see chart for details).        Patient with increasing right thigh pain.  Neurovascularly intact.  No known injury or skin changes on exam.  Pain is reproducible to palpation and movement of the right hip.  No concerning symptoms for cauda equina or spinal abscess.  Plain film x-ray does show mild osteoarthritis of the hip joint.  Clinically, I have a low suspicion for DVT, but I will have patient return tomorrow for ultrasound to rule it out.  Appointment time at 9:30 AM.  Subcu Lovenox given tonight.  Patient agrees to treatment plan and will return tomorrow as planned.  Final Clinical Impressions(s) / ED Diagnoses   Final diagnoses:  Right leg pain    ED Discharge Orders         Ordered    US Venous Img Lower Unilateral Right     09/02/18 1921    HYDROcodone-acetaminophen (NORCO/VICODIN) 5-325 MG tablet  Every 4 hours PRN     09/02/18 2001           Pauline Ausriplett, Synai Prettyman, PA-C 09/02/18 2022    Derwood KaplanNanavati, Ankit, MD 09/05/18 1306

## 2018-09-02 NOTE — Discharge Instructions (Addendum)
You have been scheduled to return here tomorrow morning for an ultrasound of your right leg.  You have an appointment at 930 a.m.  Please arrive 15 minutes early to register.  You may apply ice or heat to your leg if that helps.  Elevate your leg when possible.  Follow-up with your primary doctor this coming week after your ultrasound results

## 2018-09-03 ENCOUNTER — Ambulatory Visit (HOSPITAL_COMMUNITY)
Admission: RE | Admit: 2018-09-03 | Discharge: 2018-09-03 | Disposition: A | Discharge status: Home / Self Care | Payer: Medicare Other | Source: Ambulatory Visit | Attending: Emergency Medicine | Admitting: Emergency Medicine

## 2018-09-03 DIAGNOSIS — M79651 Pain in right thigh: Secondary | ICD-10-CM | POA: Diagnosis not present

## 2018-09-03 NOTE — ED Provider Notes (Signed)
Pt presents back to the ED for a RLE [place] for DVT study.    No evidence of deep venous thrombosis.  Limited exam due to body habitus.  Pt is stable for d/c home.  Return if worse.   Isla Pence, MD 09/03/18 1043

## 2018-09-04 MED FILL — Hydrocodone-Acetaminophen Tab 5-325 MG: ORAL | Qty: 6 | Status: AC

## 2018-09-05 LAB — URINE CULTURE

## 2018-09-27 ENCOUNTER — Ambulatory Visit: Payer: Medicare Other | Admitting: Internal Medicine

## 2018-09-27 DIAGNOSIS — Z0289 Encounter for other administrative examinations: Secondary | ICD-10-CM

## 2018-09-29 ENCOUNTER — Ambulatory Visit (INDEPENDENT_AMBULATORY_CARE_PROVIDER_SITE_OTHER): Payer: Medicare Other | Admitting: *Deleted

## 2018-09-29 DIAGNOSIS — I639 Cerebral infarction, unspecified: Secondary | ICD-10-CM

## 2018-10-01 LAB — CUP PACEART REMOTE DEVICE CHECK
Date Time Interrogation Session: 20200830164126
Implantable Pulse Generator Implant Date: 20180220

## 2018-10-03 NOTE — Progress Notes (Signed)
Carelink Summary Report / Loop Recorder 

## 2018-10-19 IMAGING — CT CT HEAD CODE STROKE
3 series · 16 of 47 positions shown, 19 images · non-contrast
Comparison: None.

CLINICAL DATA: Code stroke. Sudden numbness and weakness of the
left side of body.

EXAM:
CT HEAD WITHOUT CONTRAST
TECHNIQUE: Contiguous axial images were obtained from the base of the skull
through the vertex without intravenous contrast.

[Series 2: head code stroke wo · axial · 0.43mm/px · z∈[+1448,+1573]mm · 10 of 30 slices shown, 13 images]
[im 3/30  brain]
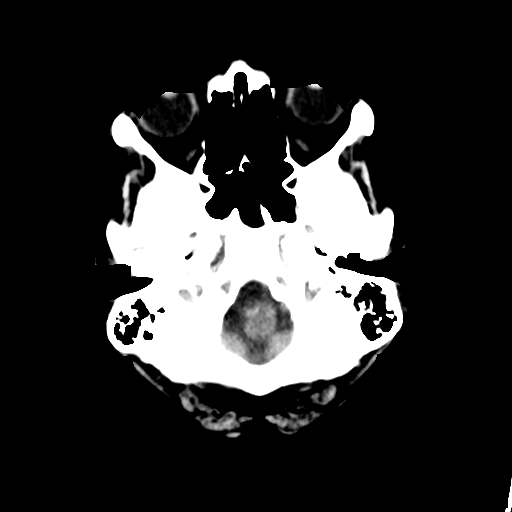
[im 3/30  bone]
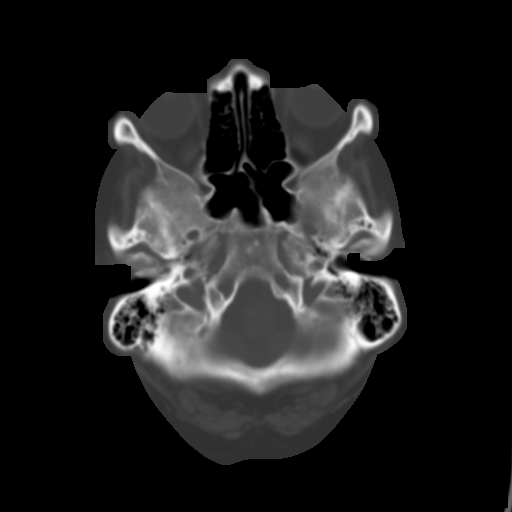
[im 6/30  brain]
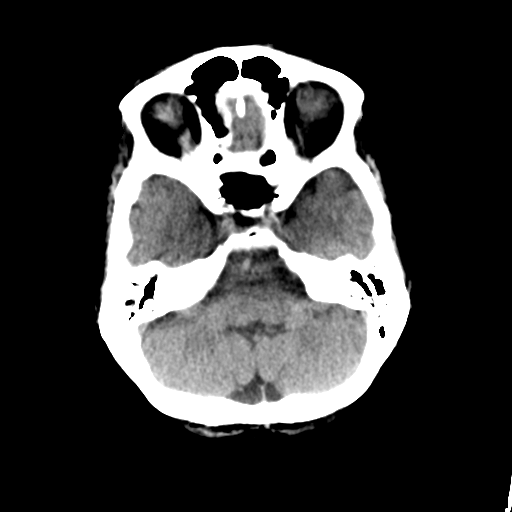
[im 9/30  brain]
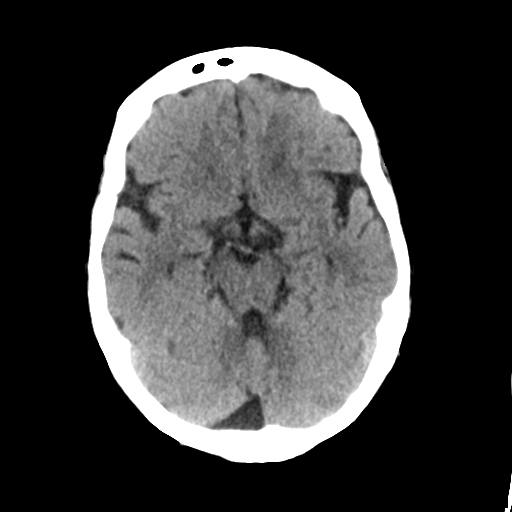
[im 11/30  brain]
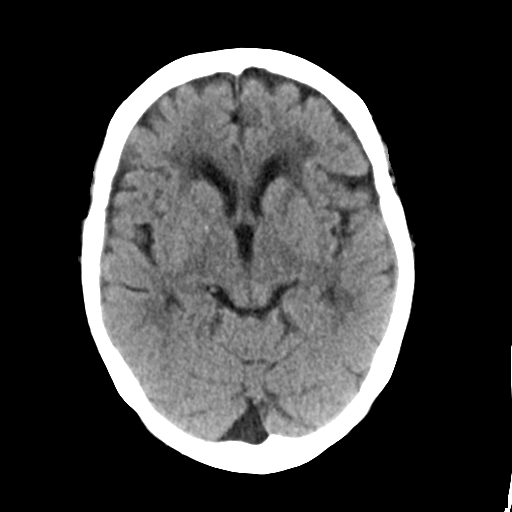
[im 14/30  brain]
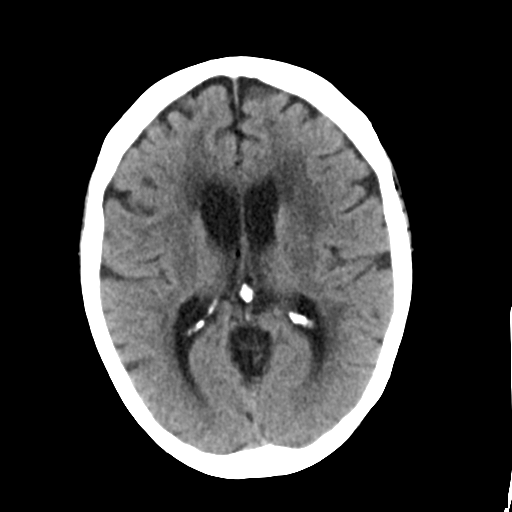
[im 14/30  bone]
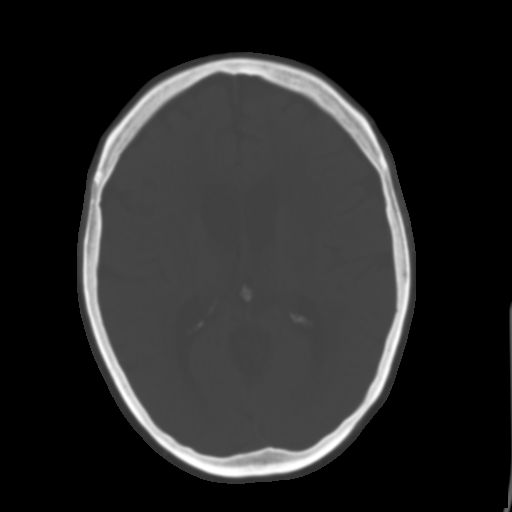
[im 17/30  brain]
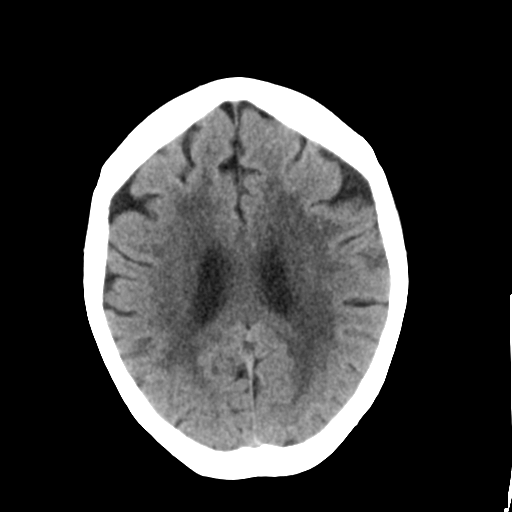
[im 20/30  brain]
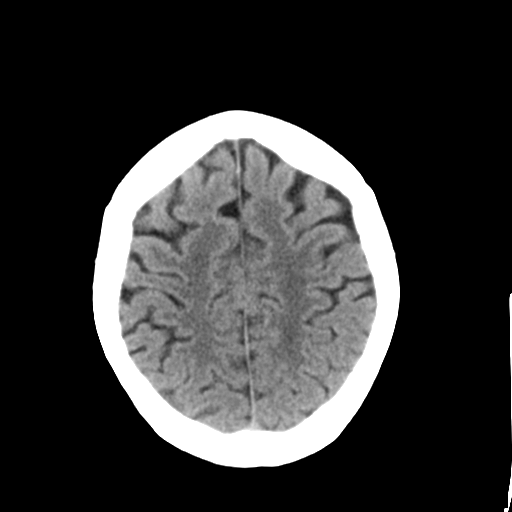
[im 23/30  brain]
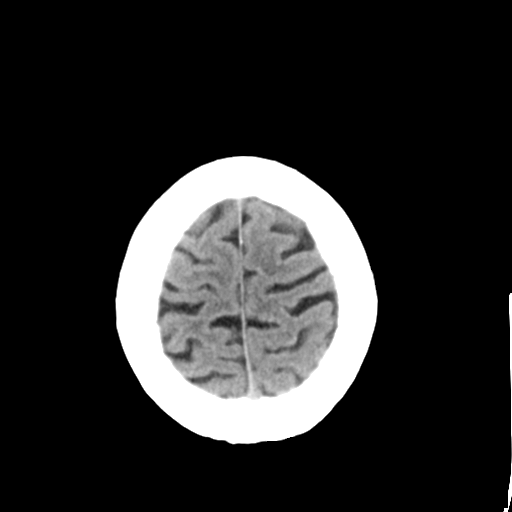
[im 25/30  brain]
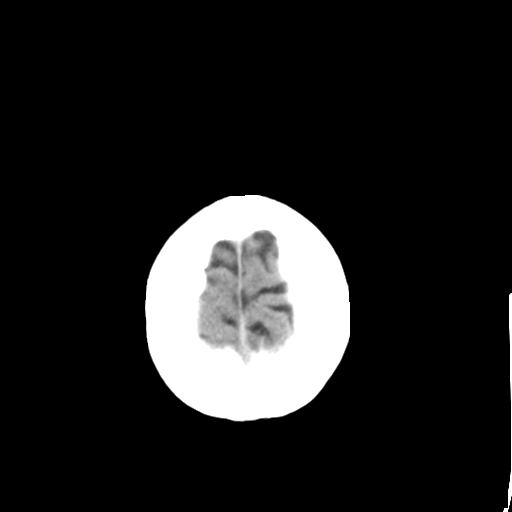
[im 25/30  bone]
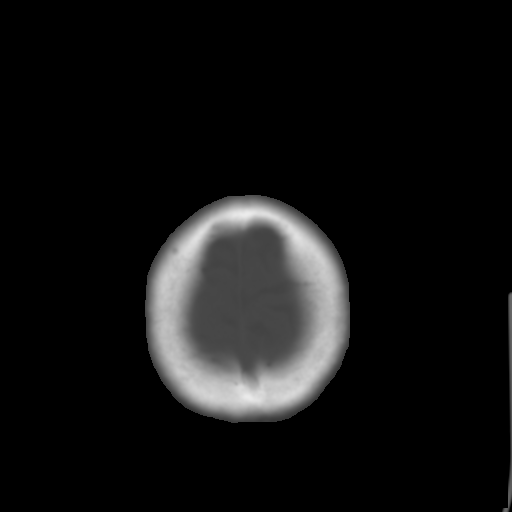
[im 28/30  brain]
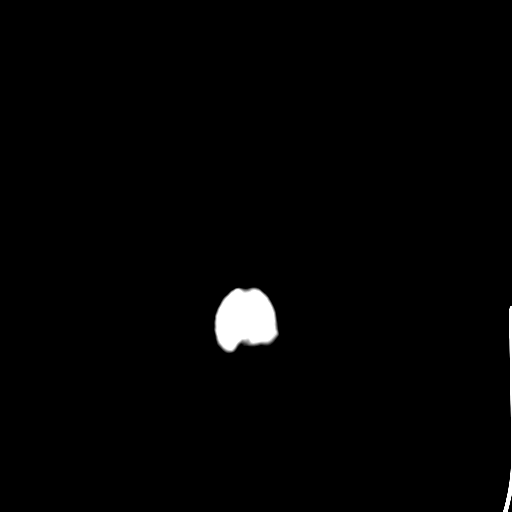

[Series 4: coronal soft tissue · coronal · 0.29mm/px · 3 of 66 slices shown]
[im 22/66  brain]
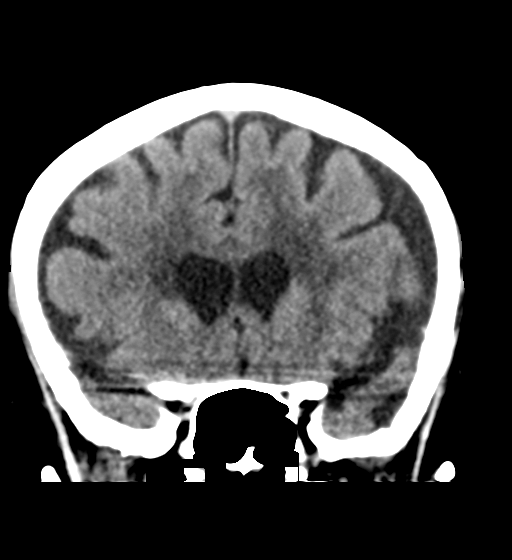
[im 29/66  brain]
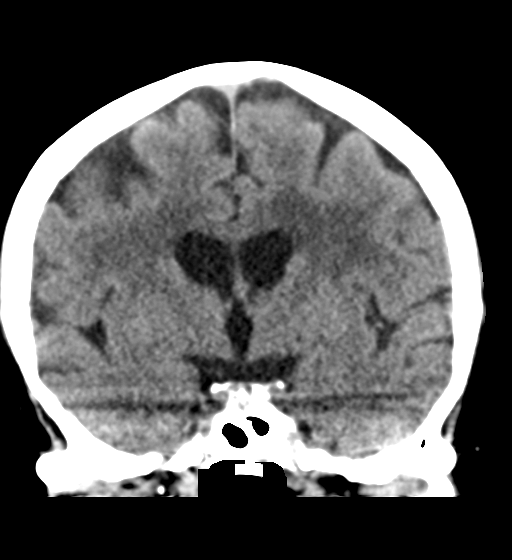
[im 37/66  brain]
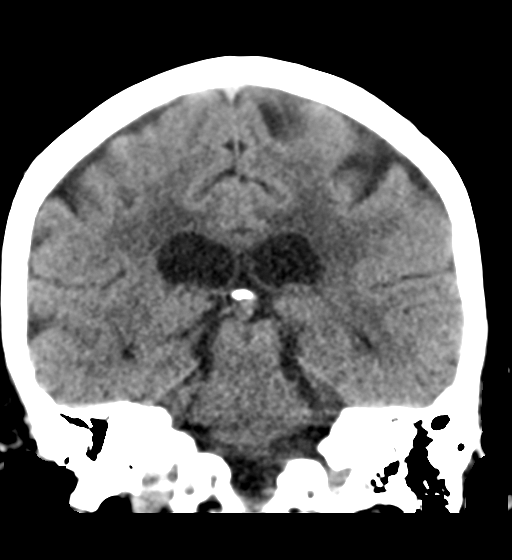

[Series 5: sagittal soft tissue · sagittal · 0.31mm/px · 3 of 57 slices shown]
[im 19/57  brain]
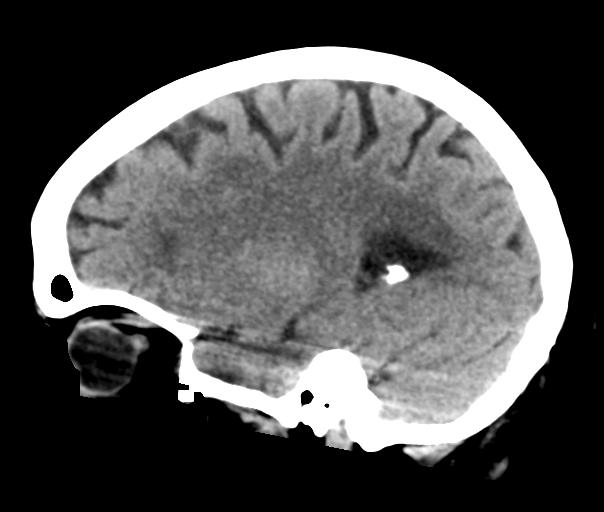
[im 29/57  brain]
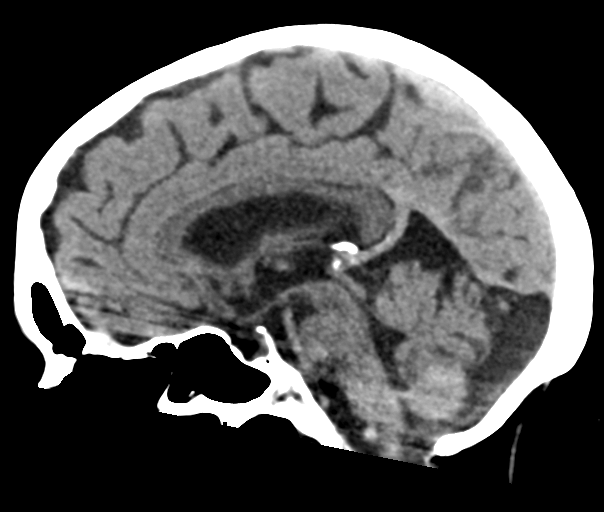
[im 38/57  brain]
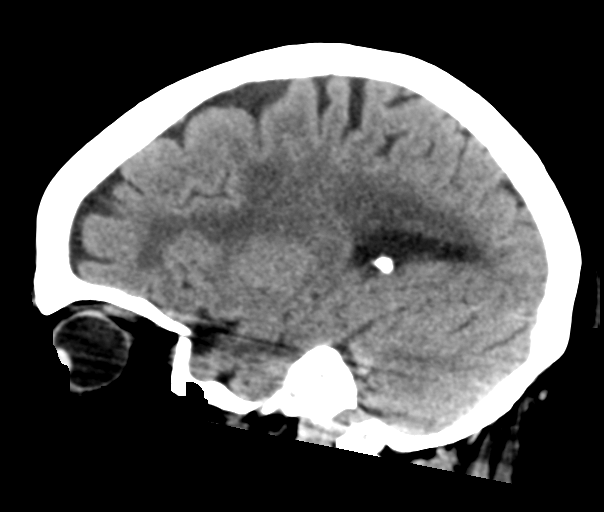

[16 of 47 positions shown; findings below may reference images not displayed]

FINDINGS: Brain: No evidence of acute infarction, hemorrhage, hydrocephalus,
extra-axial collection or mass lesion/mass effect. Minimal fat along
the right fornix, incidental.

Extensive low-density in the cerebral white matter consistent
chronic microvascular disease.

Small incidental retro cerebellar CSF collection.

Vascular: Atherosclerotic calcification.

Skull: Negative

Sinuses/Orbits: Negative

Other: These results were called by telephone at the time of
interpretation on 03/20/2016 at [DATE] to Dr. Pabon, who verbally
acknowledged these results.

ASPECTS (Alberta Stroke Program Early CT Score)

- Ganglionic level infarction (caudate, lentiform nuclei, internal
capsule, insula, M1-M3 cortex): 7

- Supraganglionic infarction (M4-M6 cortex): 3

Total score (0-10 with 10 being normal): 10
IMPRESSION: 1. No acute finding. ASPECTS is 10.
2. Extensive chronic microvascular disease in the cerebral white
matter.

## 2018-11-01 ENCOUNTER — Ambulatory Visit (INDEPENDENT_AMBULATORY_CARE_PROVIDER_SITE_OTHER): Payer: Medicare Other | Admitting: *Deleted

## 2018-11-01 DIAGNOSIS — I639 Cerebral infarction, unspecified: Secondary | ICD-10-CM

## 2018-11-04 LAB — CUP PACEART REMOTE DEVICE CHECK
Date Time Interrogation Session: 20201002164114
Implantable Pulse Generator Implant Date: 20180220

## 2018-11-07 NOTE — Progress Notes (Signed)
Carelink Summary Report / Loop Recorder 

## 2018-12-06 ENCOUNTER — Ambulatory Visit (INDEPENDENT_AMBULATORY_CARE_PROVIDER_SITE_OTHER): Payer: Medicare Other | Admitting: *Deleted

## 2018-12-06 DIAGNOSIS — I639 Cerebral infarction, unspecified: Secondary | ICD-10-CM | POA: Diagnosis not present

## 2018-12-06 LAB — CUP PACEART REMOTE DEVICE CHECK
Date Time Interrogation Session: 20201104164206
Implantable Pulse Generator Implant Date: 20180220

## 2018-12-24 NOTE — Progress Notes (Signed)
Carelink Summary Report / Loop Recorder 

## 2019-01-08 ENCOUNTER — Ambulatory Visit (INDEPENDENT_AMBULATORY_CARE_PROVIDER_SITE_OTHER): Payer: Medicare Other | Admitting: *Deleted

## 2019-01-08 DIAGNOSIS — I639 Cerebral infarction, unspecified: Secondary | ICD-10-CM

## 2019-01-09 LAB — CUP PACEART REMOTE DEVICE CHECK
Date Time Interrogation Session: 20201208110814
Implantable Pulse Generator Implant Date: 20180220

## 2019-02-05 ENCOUNTER — Telehealth: Payer: Self-pay | Admitting: Internal Medicine

## 2019-02-05 NOTE — Telephone Encounter (Signed)
Pt daughter called to let us know pt tested positive for Covid on 02/04/19 and is having no symptoms

## 2019-02-07 NOTE — Telephone Encounter (Signed)
Rec vitamin D3 4000 IU daily  Vitamin C 1000 mg daily  Zinc 100 mg daily  Quercetin 500 mg 2x per day  Mucinex dm green label  Use inhalers    If worse go to hospital for chest pain, fever, dizziness, sob, confusion worsening sxs Entire family and contacts need to stay at home x 2 weeks    Inform daughter   Called Pt and spoke with her daughter, she stated she understood with No questions.

## 2019-02-07 NOTE — Telephone Encounter (Signed)
Rec vitamin D3 4000 IU daily  Vitamin C 1000 mg daily  Zinc 100 mg daily  Quercetin 500 mg 2x per day  Mucinex dm green label  Use inhalers   If worse go to hospital for chest pain, fever, dizziness, sob, confusion worsening sxs Entire family and contacts need to stay at home x 2 weeks   Inform daughter

## 2019-02-09 ENCOUNTER — Ambulatory Visit (INDEPENDENT_AMBULATORY_CARE_PROVIDER_SITE_OTHER): Payer: Medicare Other | Admitting: *Deleted

## 2019-02-09 DIAGNOSIS — I639 Cerebral infarction, unspecified: Secondary | ICD-10-CM | POA: Diagnosis not present

## 2019-02-11 LAB — CUP PACEART REMOTE DEVICE CHECK
Date Time Interrogation Session: 20210110111007
Implantable Pulse Generator Implant Date: 20180220

## 2019-03-12 ENCOUNTER — Ambulatory Visit (INDEPENDENT_AMBULATORY_CARE_PROVIDER_SITE_OTHER): Payer: Medicare Other | Admitting: *Deleted

## 2019-03-12 DIAGNOSIS — I639 Cerebral infarction, unspecified: Secondary | ICD-10-CM

## 2019-03-12 LAB — CUP PACEART REMOTE DEVICE CHECK
Date Time Interrogation Session: 20210207233220
Implantable Pulse Generator Implant Date: 20180220

## 2019-03-13 NOTE — Progress Notes (Signed)
ILR Remote 

## 2019-04-06 ENCOUNTER — Other Ambulatory Visit: Payer: Self-pay

## 2019-04-06 ENCOUNTER — Emergency Department (HOSPITAL_COMMUNITY)
Admission: EM | Admit: 2019-04-06 | Discharge: 2019-04-06 | Disposition: A | Discharge status: Home / Self Care | Payer: Medicare Other | Attending: Emergency Medicine | Admitting: Emergency Medicine

## 2019-04-06 ENCOUNTER — Emergency Department (HOSPITAL_COMMUNITY): Payer: Medicare Other

## 2019-04-06 ENCOUNTER — Encounter (HOSPITAL_COMMUNITY): Payer: Self-pay | Admitting: Emergency Medicine

## 2019-04-06 DIAGNOSIS — Z87891 Personal history of nicotine dependence: Secondary | ICD-10-CM | POA: Diagnosis not present

## 2019-04-06 DIAGNOSIS — I10 Essential (primary) hypertension: Secondary | ICD-10-CM | POA: Insufficient documentation

## 2019-04-06 DIAGNOSIS — H1032 Unspecified acute conjunctivitis, left eye: Secondary | ICD-10-CM | POA: Diagnosis not present

## 2019-04-06 DIAGNOSIS — Z79899 Other long term (current) drug therapy: Secondary | ICD-10-CM | POA: Diagnosis not present

## 2019-04-06 DIAGNOSIS — H109 Unspecified conjunctivitis: Secondary | ICD-10-CM

## 2019-04-06 DIAGNOSIS — H5712 Ocular pain, left eye: Secondary | ICD-10-CM | POA: Diagnosis present

## 2019-04-06 DIAGNOSIS — J449 Chronic obstructive pulmonary disease, unspecified: Secondary | ICD-10-CM

## 2019-04-06 DIAGNOSIS — R05 Cough: Secondary | ICD-10-CM | POA: Diagnosis not present

## 2019-04-06 LAB — COMPREHENSIVE METABOLIC PANEL
ALT: 17 U/L (ref 0–44)
AST: 21 U/L (ref 15–41)
Albumin: 3.9 g/dL (ref 3.5–5.0)
Alkaline Phosphatase: 80 U/L (ref 38–126)
Anion gap: 11 (ref 5–15)
BUN: 21 mg/dL (ref 8–23)
CO2: 25 mmol/L (ref 22–32)
Calcium: 9.6 mg/dL (ref 8.9–10.3)
Chloride: 100 mmol/L (ref 98–111)
Creatinine, Ser: 0.96 mg/dL (ref 0.44–1.00)
GFR calc Af Amer: >60 mL/min (ref >60)
GFR calc non Af Amer: >60 mL/min (ref >60)
Glucose, Bld: 110 mg/dL — ABNORMAL HIGH (ref 70–99)
Potassium: 4.1 mmol/L (ref 3.5–5.1)
Sodium: 136 mmol/L (ref 135–145)
Total Bilirubin: 0.4 mg/dL (ref 0.3–1.2)
Total Protein: 7.7 g/dL (ref 6.5–8.1)

## 2019-04-06 LAB — CBC WITH DIFFERENTIAL/PLATELET
Abs Immature Granulocytes: 0.02 10*3/uL (ref 0.00–0.07)
Basophils Absolute: 0 10*3/uL (ref 0.0–0.1)
Basophils Relative: 0 %
Eosinophils Absolute: 0.3 10*3/uL (ref 0.0–0.5)
Eosinophils Relative: 4 %
HCT: 43.7 % (ref 36.0–46.0)
Hemoglobin: 14.1 g/dL (ref 12.0–15.0)
Immature Granulocytes: 0 %
Lymphocytes Relative: 29 %
Lymphs Abs: 2 10*3/uL (ref 0.7–4.0)
MCH: 27.8 pg (ref 26.0–34.0)
MCHC: 32.3 g/dL (ref 30.0–36.0)
MCV: 86 fL (ref 80.0–100.0)
Monocytes Absolute: 0.8 10*3/uL (ref 0.1–1.0)
Monocytes Relative: 11 %
Neutro Abs: 3.9 10*3/uL (ref 1.7–7.7)
Neutrophils Relative %: 56 %
Platelets: 339 10*3/uL (ref 150–400)
RBC: 5.08 MIL/uL (ref 3.87–5.11)
RDW: 14.6 % (ref 11.5–15.5)
WBC: 6.9 10*3/uL (ref 4.0–10.5)
nRBC: 0 % (ref 0.0–0.2)

## 2019-04-06 MED ORDER — TETRACAINE HCL 0.5 % OP SOLN
2.0000 [drp] | Freq: Once | OPHTHALMIC | Status: AC
  Administered 2019-04-06: 2 [drp] via OPHTHALMIC
  Filled 2019-04-06: qty 4

## 2019-04-06 MED ORDER — FLUORESCEIN SODIUM 1 MG OP STRP
1.0000 | ORAL_STRIP | Freq: Once | OPHTHALMIC | Status: AC
  Administered 2019-04-06: 22:00:00 1 via OPHTHALMIC
  Filled 2019-04-06: qty 1

## 2019-04-06 MED ORDER — BACITRACIN-POLYMYXIN B 500-10000 UNIT/GM OP OINT: 1.0000 "application " | TOPICAL_OINTMENT | Freq: Two times a day (BID) | OPHTHALMIC | 0 refills | Status: DC

## 2019-04-06 NOTE — ED Triage Notes (Addendum)
Pt C/O SOB and cough. Pt had COVID in January. Denies fever at home. Pt denies pain. Pt also C/O sudden onset of left eye pain and blurred vision. Pt states the eye is sensitive to light. Redness is also noted to the eye.

## 2019-04-06 NOTE — ED Provider Notes (Signed)
[name] EMERGENCY DEPARTMENT Provider Note   CSN: [id number] Arrival date & time: 04/06/19  1928     History Chief Complaint  Patient presents with  . Shortness of Breath  . Eye Pain    Lauren Koch is a 69 y.o. female.  HPI Patient presents to the emergency department primarily for evaluation of left eye pain.  She reports that she has had gradually worsening discomfort in her left eye associated with redness and some blurry vision.  She has had increased drainage of clear tears, no pus or crusting.  She reports some sensitivity to light.  She not had any injury or scratching although she states she has been rubbing her eye.  Additionally she has had some shortness of breath lately, this is chronic for her COPD and not change from her baseline.  She is not had any cough or fever.  She has not had to use her oxygen at home lately.  She has continued to use her breathing treatments as prescribed.    Past Medical History:  Diagnosis Date  . Asthma   . Cataract   . COPD (chronic obstructive pulmonary disease) (HCC)   . Cryptogenic Stroke (HCC)    a. 03/2016 L ACA, L MCX, L PCA, and R dorsal pontine territory infarcts; b. 03/2016 Carotid U/S: 1-39% bil dzs; c. 03/2016 TEE: EF 60-65%, no rwma, no LA/LAA/RA/RAA thrombus. No PFO; d. 03/2016 s/p MDT Reveal Linq (ser# [id number]) - no AFib noted to date (some sinus w/ freq PACs).  . Dementia (HCC)   . Diastolic dysfunction    a. 03/2016 Echo: EF 60-65%, gr1 DD, nl RV fxn.  Marland Kitchen GERD (gastroesophageal reflux disease)   . Hypertension   . Smoker   . Vitamin D deficiency     Patient Active Problem List   Diagnosis Date Noted  . COPD (chronic obstructive pulmonary disease) with acute bronchitis (HCC) 04/03/2018  . Physical deconditioning 12/21/2017  . Urinary incontinence 12/21/2017  . Acute on chronic respiratory failure with hypoxia (HCC) 11/26/2017  . Dyspnea 11/25/2017  . Tachycardia 11/25/2017  . Syncope 06/24/2017  . Memory loss  06/10/2017  . Abnormal thyroid function test 06/10/2017  . Breast cyst, left 06/10/2017  . Hyperlipemia 03/25/2016  . 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 Osteopenia 08/08/2015  . Pulmonary emphysema (HCC)   . Abnormal TSH 05/30/2015  . Acute respiratory failure with hypoxia (HCC) 05/29/2015  . COPD exacerbation (HCC) 05/29/2015  . Acute kidney injury (HCC) 05/29/2015  . Morbid obesity (HCC) 05/29/2015  . Tobacco abuse 05/29/2015  . Right leg pain 05/29/2015  . Essential hypertension 05/29/2015  . Vitamin D deficiency 04/17/2015  . Smoker 03/03/2015  . Asthma   . Cataract   . COPD (chronic obstructive pulmonary disease) (HCC)   . GERD (gastroesophageal reflux disease)     Past Surgical History:  Procedure Laterality Date  . ABDOMINAL HYSTERECTOMY     partial   . CESAREAN SECTION  1971/1976  . HERNIA REPAIR  2013  . LOOP RECORDER INSERTION N/A 03/23/2016   Procedure: Loop Recorder Insertion;  [name], MD;  [name];  Service: Cardiovascular;  [name];  . PARTIAL HYMENECTOMY    . TEE WITHOUT CARDIOVERSION N/A 03/23/2016   Procedure: TRANSESOPHAGEAL ECHOCARDIOGRAM (TEE);  [name], MD;  Location: [facility] ENDOSCOPY;  Service: Cardiovascular;  [name];     OB History    Lauren Koch  2   Para  2   Term  2   Preterm      AB      Living  2     SAB      TAB      Ectopic      Multiple      Live Births              Family History  Problem Relation Age of Onset  . Diabetes Mother   . Hypertension Mother   . Miscarriages / [place] Mother   . Hyperlipidemia Mother   . Heart disease Father   . Diabetes Father   . Hypertension Father   . Hyperlipidemia Father   . Stroke Father   . Cancer Sister        skin  . Diabetes Other   . Hyperlipidemia Daughter     Social History   Tobacco Use  . Smoking status: Former Smoker    Packs/day: 1.00      Years: 48.00    Pack years: 48.00    Types: Cigarettes    Quit date: 12/02/2017    Years since quitting: 1.3  . Smokeless tobacco: Former Systems developer    Quit date: 05/29/2015  Substance Use Topics  . Alcohol use: Not Currently    Alcohol/week: 0.0 standard drinks  . Drug use: No    Home Medications Prior to Admission medications   Medication Sig Start Date End Date Taking? Authorizing Provider  albuterol (PROVENTIL HFA;[name] Inhale 1-2 puffs into the lungs every 6 (six) hours as needed for wheezing or shortness of breath. 05/08/18   McLean-Scocuzza, Nino Glow, MD  atorvastatin (LIPITOR) 40 MG tablet [name] Patient not taking: Reported on 09/02/2018 05/08/18   McLean-Scocuzza, Nino Glow, MD  bacitracin-polymyxin b (POLYSPORIN) ophthalmic ointment Place 1 application into the left eye every 12 (twelve) hours. apply to eye every 12 hours while awake 04/06/19   Truddie Hidden, MD  donepezil (ARICEPT) 5 MG tablet [name] 05/08/18   McLean-Scocuzza, Nino Glow, MD  ezetimibe (ZETIA) 10 MG tablet Take 1 tablet (10 mg total) by mouth daily. Patient not taking: Reported on 09/02/2018 05/08/18   McLean-Scocuzza, Nino Glow, MD  Fluticasone-Umeclidin-Vilant (TRELEGY ELLIPTA) 100-62.5-25 MCG/INH AEPB Inhale 1 puff into the lungs daily. Rinse mouth 05/01/18   McLean-Scocuzza, Nino Glow, MD  hydrochlorothiazide (MICROZIDE) 12.5 MG capsule Take 1 capsule (12.5 mg total) by mouth daily. 05/08/18   McLean-Scocuzza, Nino Glow, MD  HYDROcodone-acetaminophen (NORCO/VICODIN) 5-325 MG tablet Take 1 tablet by mouth every 4 (four) hours as needed. 09/02/18   Triplett, Tammy, PA-C  ibuprofen (ADVIL) 200 MG tablet Take 200 mg by mouth every 6 (six) hours as needed for fever or moderate pain.    [provider]  ipratropium-albuterol (DUONEB) 0.5-2.5 (3) MG/3ML SOLN Take 3 mLs by nebulization every 6 (six) hours as  needed (for wheezing/shortness of breath). 05/08/18   McLean-Scocuzza, Nino Glow, MD  lisinopril (PRINIVIL,[name] Take 1 tablet (20 mg total) by mouth daily. Patient taking differently: Take 10 mg by mouth daily.  05/08/18   McLean-Scocuzza, Nino Glow, MD    Allergies    Prednisone  Review of Systems   Review of Systems  Constitutional: Negative for fever.  HENT: Negative for congestion and sore throat.   Eyes: Positive for photophobia, pain, redness and  itching.  Respiratory: Positive for shortness of breath. Negative for cough.   Cardiovascular: Negative for chest pain.  Gastrointestinal: Negative for abdominal pain, diarrhea, nausea and vomiting.  Genitourinary: Negative for dysuria.  Musculoskeletal: Negative for myalgias.  Skin: Negative for rash.  Neurological: Negative for headaches.  Psychiatric/Behavioral: Negative for behavioral problems.    Physical Exam Updated Vital Signs BP 124/75   Pulse (!) 55   Temp 97.9 F (36.6 C) (Oral)   Resp (!) 22   SpO2 96%   Physical Exam Constitutional:      Appearance: Normal appearance.  HENT:     Head: Normocephalic and atraumatic.     Nose: Nose normal.     Mouth/Throat:     Mouth: Mucous membranes are moist.  Eyes:     Extraocular Movements: Extraocular movements intact.     Comments: Right eyes normal, left eye has moderate conjunctival injection and chemosis.  His anterior chamber is normal pupils are equal round reactive to light there is no pain with pupillary constriction of either eye.  Her fluorescein exam is negative for corneal abrasion and her pressure in the left eye is 9.   Cardiovascular:     Rate and Rhythm: Normal rate.  Pulmonary:     Effort: Pulmonary effort is normal.     Breath sounds: Wheezing (Mild) present.  Abdominal:     General: Abdomen is flat.     Palpations: Abdomen is soft.     Tenderness: There is no abdominal tenderness.  Musculoskeletal:        General: No swelling. Normal range  of motion.     Cervical back: Neck supple.  Skin:    General: Skin is warm and dry.  Neurological:     General: No focal deficit present.     Mental Status: She is alert.  Psychiatric:        Mood and Affect: Mood normal.     ED Results / Procedures / Treatments   Labs (all labs ordered are listed, but only abnormal results are displayed) Labs Reviewed  COMPREHENSIVE METABOLIC PANEL - Abnormal; Notable for the following components:      Result Value   Glucose, Bld 110 (*)    All other components within normal limits  CBC WITH DIFFERENTIAL/PLATELET    EKG EKG Interpretation  Date/Time:  Friday April 06 2019 20:37:37 EST Ventricular Rate:  57 PR Interval:    QRS Duration: 99 QT Interval:  478 QTC Calculation: 466 R Axis:   21 Text Interpretation: Sinus rhythm Low voltage, precordial leads Nonspecific T abnormalities, anterior leads No significant change since last tracing Confirmed by [facility]  MD, Leonette Most [phone]) on 04/06/2019 8:45:27 PM   Radiology DG Chest 2 View  Result Date: 04/06/2019 CLINICAL DATA:  69 year old female with cough and shortness of breath EXAM: CHEST - 2 VIEW COMPARISON:  Chest radiograph dated 04/03/2018. FINDINGS: Bibasilar densities may represent atelectasis or infiltrate. Clinical correlation is recommended. There is no pleural effusion or pneumothorax. Stable cardiac silhouette. Loop recorder device noted. No acute osseous pathology. IMPRESSION: Bibasilar atelectasis versus infiltrate. Electronically Signed   By: Elgie Collard M.D.   On: 04/06/2019 22:01    Procedures Procedures (including critical care time)  Medications Ordered in ED Medications  tetracaine (PONTOCAINE) 0.5 % ophthalmic solution 2 drop (2 drops Both Eyes Given 04/06/19 2156)  fluorescein ophthalmic strip 1 strip (1 strip Left Eye Given 04/06/19 2157)    ED Course  I have reviewed the triage vital signs and  the nursing notes.  Pertinent labs & imaging results that were  available during my care of the patient were reviewed by me and considered in my medical decision making (see chart for details).  Clinical Course as of Apr 05 2220  Fri Apr 06, 2019  2216 Patient here primarily for left eye pain and redness, her exam is consistent with a conjunctivitis.  There is no other concerning exam findings with fluorescein or Tono-Pen.   [CS]  2217 With regards to her shortness of breath this appears to be at baseline, her lab tests are unremarkable including normal white blood cell count she is not febrile and not hypoxic on room air.  Her chest x-ray shows atelectasis versus infiltrate, I do not believe that this is infectious given the lack of other signs of infection.  She was advised to continued with her regular COPD medications.  She will be discharged with a prescription for antibiotic eyedrops for her conjunctivitis and advised follow-up the primary care physician or return to the emergency department if her symptoms worsen.   [CS]    Clinical Course User Index [CS] Pollyann Savoy, MD    Final Clinical Impression(s) / ED Diagnoses Final diagnoses:  Conjunctivitis of left eye, unspecified conjunctivitis type  Chronic obstructive pulmonary disease, unspecified COPD type [facility])    Rx / DC Orders ED Discharge Orders         Ordered    bacitracin-polymyxin b (POLYSPORIN) ophthalmic ointment  Every 12 hours     04/06/19 2222           Pollyann Savoy, MD 04/06/19 2223

## 2019-04-12 ENCOUNTER — Ambulatory Visit (INDEPENDENT_AMBULATORY_CARE_PROVIDER_SITE_OTHER): Payer: No Typology Code available for payment source | Admitting: *Deleted

## 2019-04-12 DIAGNOSIS — I639 Cerebral infarction, unspecified: Secondary | ICD-10-CM | POA: Diagnosis not present

## 2019-04-12 LAB — CUP PACEART REMOTE DEVICE CHECK
Date Time Interrogation Session: 20210311001618
Implantable Pulse Generator Implant Date: 20180220

## 2019-04-13 NOTE — Progress Notes (Signed)
ILR Remote 

## 2019-05-10 ENCOUNTER — Telehealth: Payer: Self-pay | Admitting: Internal Medicine

## 2019-05-10 NOTE — Telephone Encounter (Signed)
Patient called the [facility] pharmacy and told her that all her prescriptions have expired, please refill.

## 2019-05-11 ENCOUNTER — Other Ambulatory Visit: Payer: Self-pay | Admitting: Internal Medicine

## 2019-05-11 DIAGNOSIS — I10 Essential (primary) hypertension: Secondary | ICD-10-CM

## 2019-05-11 DIAGNOSIS — J449 Chronic obstructive pulmonary disease, unspecified: Secondary | ICD-10-CM

## 2019-05-11 DIAGNOSIS — R413 Other amnesia: Secondary | ICD-10-CM

## 2019-05-11 DIAGNOSIS — E785 Hyperlipidemia, unspecified: Secondary | ICD-10-CM

## 2019-05-11 MED ORDER — IPRATROPIUM-ALBUTEROL 0.5-2.5 (3) MG/3ML IN SOLN: 3.0000 mL | Freq: Four times a day (QID) | RESPIRATORY_TRACT | 4 refills | Status: AC | PRN

## 2019-05-11 MED ORDER — ATORVASTATIN CALCIUM 40 MG PO TABS: 40.0000 mg | ORAL_TABLET | Freq: Every day | ORAL | 0 refills | Status: DC

## 2019-05-11 MED ORDER — LISINOPRIL 10 MG PO TABS: 10.0000 mg | ORAL_TABLET | Freq: Every day | ORAL | 0 refills | Status: DC

## 2019-05-11 MED ORDER — EZETIMIBE 10 MG PO TABS: 10.0000 mg | ORAL_TABLET | Freq: Every day | ORAL | 0 refills | Status: DC

## 2019-05-11 MED ORDER — HYDROCHLOROTHIAZIDE 12.5 MG PO CAPS: 12.5000 mg | ORAL_CAPSULE | Freq: Every day | ORAL | 0 refills | Status: DC

## 2019-05-11 MED ORDER — ALBUTEROL SULFATE HFA 108 (90 BASE) MCG/ACT IN AERS: 1.0000 | INHALATION_SPRAY | Freq: Four times a day (QID) | RESPIRATORY_TRACT | 3 refills | Status: AC | PRN

## 2019-05-11 MED ORDER — TRELEGY ELLIPTA 100-62.5-25 MCG/INH IN AEPB: 1.0000 | INHALATION_SPRAY | Freq: Every day | RESPIRATORY_TRACT | 4 refills | Status: AC

## 2019-05-11 MED ORDER — DONEPEZIL HCL 5 MG PO TABS: 5.0000 mg | ORAL_TABLET | Freq: Every day | ORAL | 0 refills | Status: DC

## 2019-05-11 NOTE — Telephone Encounter (Signed)
Inform daughter pt not seen in 1 year  Needs f/u sch am appt for fasting labs same day asap ?  Sent meds x 3 months until appt scheduled  Needs 1x per year at least visit for med refills

## 2019-05-14 ENCOUNTER — Ambulatory Visit (INDEPENDENT_AMBULATORY_CARE_PROVIDER_SITE_OTHER): Payer: Medicare Other | Admitting: *Deleted

## 2019-05-14 DIAGNOSIS — I639 Cerebral infarction, unspecified: Secondary | ICD-10-CM

## 2019-05-14 LAB — CUP PACEART REMOTE DEVICE CHECK
Date Time Interrogation Session: 20210411031258
Implantable Pulse Generator Implant Date: 20180220

## 2019-05-14 NOTE — Telephone Encounter (Signed)
Called and informed patient's daughter of the below. She verbalized understanding. She will look and see when her appointment with Dr French Ana is and will call back to try and schedule this patient's appointment around the same time.

## 2019-05-15 NOTE — Progress Notes (Signed)
ILR Remote 

## 2019-05-23 ENCOUNTER — Emergency Department (HOSPITAL_COMMUNITY)
Admission: EM | Admit: 2019-05-23 | Discharge: 2019-05-23 | Disposition: A | Discharge status: Home / Self Care | Payer: Medicare Other | Attending: Emergency Medicine | Admitting: Emergency Medicine

## 2019-05-23 ENCOUNTER — Encounter (HOSPITAL_COMMUNITY): Payer: Self-pay

## 2019-05-23 ENCOUNTER — Other Ambulatory Visit: Payer: Self-pay

## 2019-05-23 ENCOUNTER — Emergency Department (HOSPITAL_COMMUNITY): Payer: Medicare Other

## 2019-05-23 DIAGNOSIS — Z87891 Personal history of nicotine dependence: Secondary | ICD-10-CM | POA: Diagnosis not present

## 2019-05-23 DIAGNOSIS — F0390 Unspecified dementia without behavioral disturbance: Secondary | ICD-10-CM | POA: Insufficient documentation

## 2019-05-23 DIAGNOSIS — Z79899 Other long term (current) drug therapy: Secondary | ICD-10-CM | POA: Diagnosis not present

## 2019-05-23 DIAGNOSIS — Z8673 Personal history of transient ischemic attack (TIA), and cerebral infarction without residual deficits: Secondary | ICD-10-CM | POA: Diagnosis not present

## 2019-05-23 DIAGNOSIS — J449 Chronic obstructive pulmonary disease, unspecified: Secondary | ICD-10-CM | POA: Diagnosis not present

## 2019-05-23 DIAGNOSIS — I10 Essential (primary) hypertension: Secondary | ICD-10-CM | POA: Diagnosis not present

## 2019-05-23 DIAGNOSIS — H209 Unspecified iridocyclitis: Secondary | ICD-10-CM | POA: Diagnosis not present

## 2019-05-23 DIAGNOSIS — R079 Chest pain, unspecified: Secondary | ICD-10-CM | POA: Diagnosis not present

## 2019-05-23 DIAGNOSIS — R072 Precordial pain: Secondary | ICD-10-CM | POA: Diagnosis not present

## 2019-05-23 DIAGNOSIS — R0789 Other chest pain: Secondary | ICD-10-CM | POA: Diagnosis present

## 2019-05-23 LAB — COMPREHENSIVE METABOLIC PANEL
ALT: 19 U/L (ref 0–44)
AST: 28 U/L (ref 15–41)
Albumin: 4 g/dL (ref 3.5–5.0)
Alkaline Phosphatase: 79 U/L (ref 38–126)
Anion gap: 11 (ref 5–15)
BUN: 12 mg/dL (ref 8–23)
CO2: 24 mmol/L (ref 22–32)
Calcium: 9 mg/dL (ref 8.9–10.3)
Chloride: 102 mmol/L (ref 98–111)
Creatinine, Ser: 0.99 mg/dL (ref 0.44–1.00)
GFR calc Af Amer: >60 mL/min (ref >60)
GFR calc non Af Amer: 59 mL/min — ABNORMAL LOW (ref >60)
Glucose, Bld: 131 mg/dL — ABNORMAL HIGH (ref 70–99)
Potassium: 4.5 mmol/L (ref 3.5–5.1)
Sodium: 137 mmol/L (ref 135–145)
Total Bilirubin: 0.5 mg/dL (ref 0.3–1.2)
Total Protein: 7.7 g/dL (ref 6.5–8.1)

## 2019-05-23 LAB — CBC WITH DIFFERENTIAL/PLATELET
Abs Immature Granulocytes: 0.02 10*3/uL (ref 0.00–0.07)
Basophils Absolute: 0.1 10*3/uL (ref 0.0–0.1)
Basophils Relative: 1 %
Eosinophils Absolute: 0.1 10*3/uL (ref 0.0–0.5)
Eosinophils Relative: 1 %
HCT: 46.2 % — ABNORMAL HIGH (ref 36.0–46.0)
Hemoglobin: 14.9 g/dL (ref 12.0–15.0)
Immature Granulocytes: 0 %
Lymphocytes Relative: 20 %
Lymphs Abs: 1.7 10*3/uL (ref 0.7–4.0)
MCH: 28 pg (ref 26.0–34.0)
MCHC: 32.3 g/dL (ref 30.0–36.0)
MCV: 86.8 fL (ref 80.0–100.0)
Monocytes Absolute: 0.9 10*3/uL (ref 0.1–1.0)
Monocytes Relative: 11 %
Neutro Abs: 5.7 10*3/uL (ref 1.7–7.7)
Neutrophils Relative %: 67 %
Platelets: 361 10*3/uL (ref 150–400)
RBC: 5.32 MIL/uL — ABNORMAL HIGH (ref 3.87–5.11)
RDW: 13.8 % (ref 11.5–15.5)
WBC: 8.5 10*3/uL (ref 4.0–10.5)
nRBC: 0 % (ref 0.0–0.2)

## 2019-05-23 LAB — TROPONIN I (HIGH SENSITIVITY): Troponin I (High Sensitivity): 5 ng/L (ref <18)

## 2019-05-23 MED ORDER — TETRACAINE HCL 0.5 % OP SOLN
1.0000 [drp] | Freq: Once | OPHTHALMIC | Status: AC
  Administered 2019-05-23: 19:00:00 1 [drp] via OPHTHALMIC
  Filled 2019-05-23: qty 4

## 2019-05-23 MED ORDER — PREDNISOLONE ACETATE 1 % OP SUSP
1.0000 [drp] | Freq: Four times a day (QID) | OPHTHALMIC | Status: AC
  Administered 2019-05-23: 1 [drp] via OPHTHALMIC
  Filled 2019-05-23 (×2): qty 5

## 2019-05-23 MED ORDER — FLUORESCEIN SODIUM 1 MG OP STRP
1.0000 | ORAL_STRIP | Freq: Once | OPHTHALMIC | Status: AC
  Administered 2019-05-23: 19:00:00 1 via OPHTHALMIC
  Filled 2019-05-23: qty 1

## 2019-05-23 MED ORDER — ASPIRIN 81 MG PO CHEW
324.0000 mg | CHEWABLE_TABLET | Freq: Once | ORAL | Status: AC
  Administered 2019-05-23: 19:00:00 324 mg via ORAL
  Filled 2019-05-23: qty 4

## 2019-05-23 MED ORDER — CIPROFLOXACIN HCL 0.3 % OP SOLN
1.0000 [drp] | Freq: Four times a day (QID) | OPHTHALMIC | Status: DC
  Administered 2019-05-23: 23:00:00 1 [drp] via OPHTHALMIC
  Filled 2019-05-23: qty 2.5

## 2019-05-23 NOTE — ED Provider Notes (Signed)
[facility] Provider Note   CSN: [id number] Arrival date & time: 05/23/19  1837     History Chief Complaint  Patient presents with  . Chest Pain  . Eye Pain    Lauren Koch is a 69 y.o. female.  HPI   This patient is a 69 year old female, prior history of a stroke, prior history of mild diastolic dysfunction and a history of mild dementia as well as COPD though she no longer smokes.  She presents to the hospital in the care of her daughter.  She reports that she had acute onset of left parasternal sharp chest pain which occurred several hours ago and has been persistent since worse with position palpation and deep breathing and not associated with coughing fevers or shortness of breath.  There has been no increased swelling of the legs, no history of cancer, no history of trauma or surgery recently.  The patient does not have any pain with exertion, she states that she was at rest when the pain came on, it has been constant for several hours and worse with the above-mentioned interventions.  She also complains of eye pain on the left with redness.  There is no swelling or discharge from the eye.  She was seen in the emergency department recently, she was diagnosed with conjunctivitis, she was prescribed Polysporin ophthalmic ointment which she has used intermittently.  They did not follow-up with an eye doctor, the daughter states that they called somebody's office but was told they did not have any appointments and to call back later.  She states this was somebody that she was familiar with, she did not pursue further evaluation and now has increased redness back in the eye.  She has some light sensitivity in that eye  Past Medical History:  Diagnosis Date  . Asthma   . Cataract   . COPD (chronic obstructive pulmonary disease) (HCC)   . Cryptogenic Stroke (HCC)    a. 03/2016 L ACA, L MCX, L PCA, and R dorsal pontine territory infarcts; b. 03/2016 Carotid U/S:  1-39% bil dzs; c. 03/2016 TEE: EF 60-65%, no rwma, no LA/LAA/RA/RAA thrombus. No PFO; d. 03/2016 s/p MDT Reveal Linq (ser# [id number]) - no AFib noted to date (some sinus w/ freq PACs).  . Dementia (HCC)   . Diastolic dysfunction    a. 03/2016 Echo: EF 60-65%, gr1 DD, nl RV fxn.  Marland Kitchen. GERD (gastroesophageal reflux disease)   . Hypertension   . Smoker   . Vitamin D deficiency     Patient Active Problem List   Diagnosis Date Noted  . COPD (chronic obstructive pulmonary disease) with acute bronchitis (HCC) 04/03/2018  . Physical deconditioning 12/21/2017  . Urinary incontinence 12/21/2017  . Acute on chronic respiratory failure with hypoxia (HCC) 11/26/2017  . Dyspnea 11/25/2017  . Tachycardia 11/25/2017  . Syncope 06/24/2017  . Memory loss 06/10/2017  . Abnormal thyroid function test 06/10/2017  . Breast cyst, left 06/10/2017  . Hyperlipemia 03/25/2016  . 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 Osteopenia 08/08/2015  . Pulmonary emphysema (HCC)   . Abnormal TSH 05/30/2015  . Acute respiratory failure with hypoxia (HCC) 05/29/2015  . COPD exacerbation (HCC) 05/29/2015  . Acute kidney injury (HCC) 05/29/2015  . Morbid obesity (HCC) 05/29/2015  . Tobacco abuse 05/29/2015  . Right leg pain 05/29/2015  . Essential hypertension 05/29/2015  . Vitamin D deficiency 04/17/2015  . Smoker 03/03/2015  . Asthma   .  Cataract   . COPD (chronic obstructive pulmonary disease) ([place])   . GERD (gastroesophageal reflux disease)     Past Surgical History:  Procedure Laterality Date  . ABDOMINAL HYSTERECTOMY     partial   . CESAREAN SECTION  1971/1976  . HERNIA REPAIR  2013  . LOOP RECORDER INSERTION N/A 03/23/2016   Procedure: Loop Recorder Insertion;  Surgeon: Evans Lance, MD;  Location: [place] [facility];  Service: Cardiovascular;  [name];  . PARTIAL HYMENECTOMY    . TEE WITHOUT CARDIOVERSION N/A 03/23/2016   Procedure: TRANSESOPHAGEAL  ECHOCARDIOGRAM (TEE);  Surgeon: Skeet Latch, MD;  Location: [facility] ENDOSCOPY;  Service: Cardiovascular;  [name];     OB History    Gravida  2   Para  2   Term  2   Preterm      AB      Living  2     SAB      TAB      Ectopic      Multiple      Live Births              Family History  Problem Relation Age of Onset  . Diabetes Mother   . Hypertension Mother   . Miscarriages / [place] Mother   . Hyperlipidemia Mother   . Heart disease Father   . Diabetes Father   . Hypertension Father   . Hyperlipidemia Father   . Stroke Father   . Cancer Sister        skin  . Diabetes Other   . Hyperlipidemia Daughter     Social History   Tobacco Use  . Smoking status: Former Smoker    Packs/day: 1.00    Years: 48.00    Pack years: 48.00    Types: Cigarettes    Quit date: 12/02/2017    Years since quitting: 1.4  . Smokeless tobacco: Former Systems developer    Quit date: 05/29/2015  Substance Use Topics  . Alcohol use: Not Currently    Alcohol/week: 0.0 standard drinks  . Drug use: No    Home Medications Prior to Admission medications   Medication Sig Start Date End Date Taking? Authorizing Provider  albuterol ([name] Inhale 1-2 puffs into the lungs every 6 (six) hours as needed for wheezing or shortness of breath. 05/11/19   McLean-Scocuzza, Nino Glow, MD  atorvastatin (LIPITOR) 40 MG tablet [name] 05/11/19   McLean-Scocuzza, Nino Glow, MD  bacitracin-polymyxin b (POLYSPORIN) ophthalmic ointment Place 1 application into the left eye every 12 (twelve) hours. apply to eye every 12 hours while awake 04/06/19   Truddie Hidden, MD  donepezil (ARICEPT) 5 MG tablet [name] 05/11/19   McLean-Scocuzza, Nino Glow, MD  ezetimibe (ZETIA) 10 MG tablet Take 1 tablet (10 mg total) by mouth daily. 05/11/19   McLean-Scocuzza, Nino Glow, MD  Fluticasone-Umeclidin-Vilant  (TRELEGY ELLIPTA) 100-62.5-25 MCG/INH AEPB Inhale 1 puff into the lungs daily. Rinse mouth 05/11/19   McLean-Scocuzza, Nino Glow, MD  hydrochlorothiazide (MICROZIDE) 12.5 MG capsule Take 1 capsule (12.5 mg total) by mouth daily. 05/11/19   McLean-Scocuzza, Nino Glow, MD  HYDROcodone-acetaminophen (NORCO/VICODIN) 5-325 MG tablet Take 1 tablet by mouth every 4 (four) hours as needed. 09/02/18   Triplett, Tammy, PA-C  ibuprofen (ADVIL) 200 MG tablet Take 200 mg by mouth every 6 (six) hours as  needed for fever or moderate pain.    [provider]  ipratropium-albuterol (DUONEB) 0.5-2.5 (3) MG/3ML SOLN Take 3 mLs by nebulization every 6 (six) hours as needed (for wheezing/shortness of breath). 05/11/19   McLean-Scocuzza, Pasty Spillers, MD  lisinopril (ZESTRIL) 10 MG tablet Take 1 tablet (10 mg total) by mouth daily. Not reduced dose 10 mg so take 1 pill not 1/2 pill 05/11/19   McLean-Scocuzza, Pasty Spillers, MD    Allergies    Prednisone  Review of Systems   Review of Systems  All other systems reviewed and are negative.   Physical Exam Updated Vital Signs BP (!) 159/81   Pulse (!) 50   Resp 17   Ht 1.524 m (5')   Wt 107.7 kg   SpO2 99%   BMI 46.38 kg/m   Physical Exam Vitals and nursing note reviewed.  Constitutional:      General: She is not in acute distress.    Appearance: She is well-developed.  HENT:     Head: Normocephalic and atraumatic.     Mouth/Throat:     Pharynx: No oropharyngeal exudate.  Eyes:     General: No scleral icterus.       Right eye: No discharge.        Left eye: No discharge.     Conjunctiva/sclera: Conjunctivae normal.     Pupils: Pupils are equal, round, and reactive to light.     Comments: Under slit lamp with tetracaine and fluorescein there is no signs of corneal abrasion, slit-lamp does show that there is some cells and flare, pupillary exam is normal.  The patient has difficulty tolerating the light of the slit-lamp.  Neck:     Thyroid: No thyromegaly.      Vascular: No JVD.  Cardiovascular:     Rate and Rhythm: Normal rate and regular rhythm.     Heart sounds: Normal heart sounds. No murmur. No friction rub. No gallop.   Pulmonary:     Effort: Pulmonary effort is normal. No respiratory distress.     Breath sounds: Normal breath sounds. No wheezing or rales.  Chest:     Chest wall: Tenderness ( Tender to palpation over the left parasternal border and mid chest, no rashes in this area) present.  Abdominal:     General: Bowel sounds are normal. There is no distension.     Palpations: Abdomen is soft. There is no mass.     Tenderness: There is no abdominal tenderness.  Musculoskeletal:        General: No tenderness. Normal range of motion.     Cervical back: Normal range of motion and neck supple.     Comments: No edema of the legs, no swelling, the patient is obese but no it pitting edema  Lymphadenopathy:     Cervical: No cervical adenopathy.  Skin:    General: Skin is warm and dry.     Findings: No erythema or rash.  Neurological:     Mental Status: She is alert.     Coordination: Coordination normal.  Psychiatric:        Behavior: Behavior normal.     ED Results / Procedures / Treatments   Labs (all labs ordered are listed, but only abnormal results are displayed) Labs Reviewed  CBC WITH DIFFERENTIAL/PLATELET - Abnormal; Notable for the following components:      Result Value   RBC 5.32 (*)    HCT 46.2 (*)    All other components within normal limits  COMPREHENSIVE METABOLIC PANEL - Abnormal; Notable for the following components:   Glucose, Bld 131 (*)    GFR calc non Af Amer 59 (*)    All other components within normal limits  TROPONIN I (HIGH SENSITIVITY)    EKG EKG Interpretation  Date/Time:  Wednesday May 23 2019 18:48:34 EDT Ventricular Rate:  72 PR Interval:    QRS Duration: 104 QT Interval:  428 QTC Calculation: 452 R Axis:   2 Text Interpretation: Sinus rhythm Ventricular premature complex Nonspecific T  abnormalities, anterior leads since last tracing no significant change Confirmed by Eber Hong ([id number]) on 05/23/2019 6:56:19 PM   Radiology DG Chest Port 1 View  Result Date: 05/23/2019 CLINICAL DATA:  Left side chest pain EXAM: PORTABLE CHEST 1 VIEW COMPARISON:  04/06/2019 FINDINGS: Mild cardiomegaly. No confluent airspace opacities, effusions or edema. No acute bony abnormality. Loop recorder device noted. IMPRESSION: No active disease. Electronically Signed   By: Charlett Nose M.D.   On: 05/23/2019 19:30    Procedures Procedures (including critical care time)  Medications Ordered in ED Medications  prednisoLONE acetate (PRED FORTE) 1 % ophthalmic suspension 1 drop (has no administration in time range)  ciprofloxacin (CILOXAN) 0.3 % ophthalmic solution 1 drop (has no administration in time range)  tetracaine (PONTOCAINE) 0.5 % ophthalmic solution 1 drop (1 drop Both Eyes Given 05/23/19 1912)  fluorescein ophthalmic strip 1 strip (1 strip Both Eyes Given 05/23/19 1920)  aspirin chewable tablet 324 mg (324 mg Oral Given 05/23/19 1917)    ED Course  I have reviewed the triage vital signs and the nursing notes.  Pertinent labs & imaging results that were available during my care of the patient were reviewed by me and considered in my medical decision making (see chart for details).    MDM Rules/Calculators/A&P                       This patient presents to the ED for concern of chest pain and left eye pain, this involves an extensive number of treatment options, and is a complaint that carries with it a high risk of complications and morbidity.  The differential diagnosis includes pulmonary embolism though less likely given that the patient is not tachycardic hypoxic hypotensive or febrile.  She has some pain with deep breathing but the pain is also worse with palpation of the chest and position suggesting more of a chest wall syndrome.  Her EKG is nonischemic showing nonspecific T waves  but no ST elevation.  Will obtain a chest x-ray and labs to rule out other causes such as pneumothorax, pneumonia or acute coronary syndrome.  Her left eye has redness surrounding the iris, this is more consistent with iritis, there is a small amount of consensual pain, she has a normal-appearing pupil, she is not nauseated or vomiting.  She will need follow-up with ophthalmology   Lab Tests:   I Ordered, reviewed, and interpreted labs, which included CBC, metabolic panel, troponin  Medicines ordered:   I ordered medication aspirin for chest pain  Imaging Studies ordered:   I ordered imaging studies which included portable chest x-ray and  I independently visualized and interpreted imaging which showed no acute findings  Additional history obtained:   Additional history obtained from family member  Previous records obtained and reviewed   Consultations Obtained:   I consulted with the ophthalmologist Dr. Vanessa Barbara and discussed lab and imaging findings, he will arrange outpatient follow-up for the patient on Friday  and recommends prednisolone acetate and ofloxacin, the patient does not wear contact lenses  Reevaluation:  After the interventions stated above, I reevaluated the patient and found well-appearing, improved with medications, no signs of corneal abrasion  Critical Interventions:  Slit-lamp exam showing iritis findings such as cells and flare, periciliary flare, no conjunctival damage, no corneal damage, normal reactive pupil  Normal cardiac work-up, unlikely to be cardiac disease  Final Clinical Impression(s) / ED Diagnoses Final diagnoses:  Iritis of left eye    Rx / DC Orders ED Discharge Orders    None       Eber Hong, MD 05/23/19 2214

## 2019-05-23 NOTE — Discharge Instructions (Signed)
Based on your exam and testing I think that you have iritis, please read the attached instructions  Please take the following medications exactly as prescribed  Ofloxacin 1 drop in the left eye 4 times a day Prednisolone acetate, 1 drop in the left eye 4 times a day  The eye doctor's office will call you to make an appointment for Friday  Seek medical exam for severe or worsening pain, changes in vision fever or vomiting  Your testing regarding your chest is normal, there is no signs of heart attack but you should be seen by your doctor within the week for recheck

## 2019-05-23 NOTE — ED Triage Notes (Signed)
Pt presents to ED with complaints of mid chest pain started today, denies dizziness, ha, nausea or vomiting. Pt states her left eye also started hurting yesterday, pt eye noted to be red.

## 2019-05-25 ENCOUNTER — Encounter (INDEPENDENT_AMBULATORY_CARE_PROVIDER_SITE_OTHER): Admitting: Ophthalmology

## 2019-06-06 ENCOUNTER — Telehealth: Payer: Self-pay

## 2019-06-06 NOTE — Telephone Encounter (Signed)
Left message for patient to inform of disconnected monitor. 

## 2019-08-10 ENCOUNTER — Inpatient Hospital Stay
Admission: EM | Admit: 2019-08-10 | Discharge: 2019-08-12 | DRG: 190 | Disposition: A | Discharge status: Home / Self Care | Payer: Medicare Other | Attending: Internal Medicine | Admitting: Internal Medicine

## 2019-08-10 ENCOUNTER — Encounter: Payer: Self-pay | Admitting: Nurse Practitioner

## 2019-08-10 ENCOUNTER — Emergency Department: Payer: Medicare Other

## 2019-08-10 ENCOUNTER — Other Ambulatory Visit: Payer: Self-pay

## 2019-08-10 ENCOUNTER — Telehealth (INDEPENDENT_AMBULATORY_CARE_PROVIDER_SITE_OTHER): Payer: Medicare Other | Admitting: Nurse Practitioner

## 2019-08-10 VITALS — Temp 98.2°F | Ht 60.0 in | Wt 225.0 lb

## 2019-08-10 DIAGNOSIS — J9601 Acute respiratory failure with hypoxia: Secondary | ICD-10-CM | POA: Diagnosis not present

## 2019-08-10 DIAGNOSIS — Z6841 Body Mass Index (BMI) 40.0 and over, adult: Secondary | ICD-10-CM | POA: Diagnosis not present

## 2019-08-10 DIAGNOSIS — J440 Chronic obstructive pulmonary disease with acute lower respiratory infection: Secondary | ICD-10-CM

## 2019-08-10 DIAGNOSIS — I10 Essential (primary) hypertension: Secondary | ICD-10-CM | POA: Diagnosis present

## 2019-08-10 DIAGNOSIS — J069 Acute upper respiratory infection, unspecified: Secondary | ICD-10-CM

## 2019-08-10 DIAGNOSIS — E785 Hyperlipidemia, unspecified: Secondary | ICD-10-CM | POA: Diagnosis present

## 2019-08-10 DIAGNOSIS — Z823 Family history of stroke: Secondary | ICD-10-CM

## 2019-08-10 DIAGNOSIS — Z87891 Personal history of nicotine dependence: Secondary | ICD-10-CM

## 2019-08-10 DIAGNOSIS — J441 Chronic obstructive pulmonary disease with (acute) exacerbation: Principal | ICD-10-CM

## 2019-08-10 DIAGNOSIS — R0600 Dyspnea, unspecified: Secondary | ICD-10-CM

## 2019-08-10 DIAGNOSIS — Z20822 Contact with and (suspected) exposure to covid-19: Secondary | ICD-10-CM | POA: Diagnosis not present

## 2019-08-10 DIAGNOSIS — Z79899 Other long term (current) drug therapy: Secondary | ICD-10-CM

## 2019-08-10 DIAGNOSIS — Z9981 Dependence on supplemental oxygen: Secondary | ICD-10-CM

## 2019-08-10 DIAGNOSIS — F0390 Unspecified dementia without behavioral disturbance: Secondary | ICD-10-CM | POA: Diagnosis present

## 2019-08-10 DIAGNOSIS — Z888 Allergy status to other drugs, medicaments and biological substances status: Secondary | ICD-10-CM

## 2019-08-10 DIAGNOSIS — H269 Unspecified cataract: Secondary | ICD-10-CM | POA: Diagnosis present

## 2019-08-10 DIAGNOSIS — Z83438 Family history of other disorder of lipoprotein metabolism and other lipidemia: Secondary | ICD-10-CM

## 2019-08-10 DIAGNOSIS — J209 Acute bronchitis, unspecified: Secondary | ICD-10-CM

## 2019-08-10 DIAGNOSIS — R0602 Shortness of breath: Secondary | ICD-10-CM | POA: Diagnosis not present

## 2019-08-10 DIAGNOSIS — E559 Vitamin D deficiency, unspecified: Secondary | ICD-10-CM | POA: Diagnosis present

## 2019-08-10 DIAGNOSIS — Z8249 Family history of ischemic heart disease and other diseases of the circulatory system: Secondary | ICD-10-CM

## 2019-08-10 DIAGNOSIS — Z720 Tobacco use: Secondary | ICD-10-CM

## 2019-08-10 DIAGNOSIS — Z8673 Personal history of transient ischemic attack (TIA), and cerebral infarction without residual deficits: Secondary | ICD-10-CM

## 2019-08-10 DIAGNOSIS — K219 Gastro-esophageal reflux disease without esophagitis: Secondary | ICD-10-CM | POA: Diagnosis present

## 2019-08-10 DIAGNOSIS — T380X5A Adverse effect of glucocorticoids and synthetic analogues, initial encounter: Secondary | ICD-10-CM | POA: Diagnosis not present

## 2019-08-10 DIAGNOSIS — Z833 Family history of diabetes mellitus: Secondary | ICD-10-CM

## 2019-08-10 DIAGNOSIS — Z9119 Patient's noncompliance with other medical treatment and regimen: Secondary | ICD-10-CM

## 2019-08-10 LAB — CBC
HCT: 43.6 % (ref 36.0–46.0)
Hemoglobin: 14.6 g/dL (ref 12.0–15.0)
MCH: 27.4 pg (ref 26.0–34.0)
MCHC: 33.5 g/dL (ref 30.0–36.0)
MCV: 82 fL (ref 80.0–100.0)
Platelets: 303 10*3/uL (ref 150–400)
RBC: 5.32 MIL/uL — ABNORMAL HIGH (ref 3.87–5.11)
RDW: 14 % (ref 11.5–15.5)
WBC: 9.2 10*3/uL (ref 4.0–10.5)
nRBC: 0 % (ref 0.0–0.2)

## 2019-08-10 LAB — BASIC METABOLIC PANEL
Anion gap: 10 (ref 5–15)
BUN: 12 mg/dL (ref 8–23)
CO2: 24 mmol/L (ref 22–32)
Calcium: 8.7 mg/dL — ABNORMAL LOW (ref 8.9–10.3)
Chloride: 102 mmol/L (ref 98–111)
Creatinine, Ser: 0.89 mg/dL (ref 0.44–1.00)
GFR calc Af Amer: >60 mL/min (ref >60)
GFR calc non Af Amer: >60 mL/min (ref >60)
Glucose, Bld: 107 mg/dL — ABNORMAL HIGH (ref 70–99)
Potassium: 4.5 mmol/L (ref 3.5–5.1)
Sodium: 136 mmol/L (ref 135–145)

## 2019-08-10 LAB — SARS CORONAVIRUS 2 BY RT PCR (HOSPITAL ORDER, PERFORMED IN ~~LOC~~ HOSPITAL LAB): SARS Coronavirus 2: NEGATIVE

## 2019-08-10 LAB — TROPONIN I (HIGH SENSITIVITY)
Troponin I (High Sensitivity): 7 ng/L (ref <18)
Troponin I (High Sensitivity): 6 ng/L (ref <18)

## 2019-08-10 MED ORDER — BUDESONIDE 0.25 MG/2ML IN SUSP
0.2500 mg | Freq: Two times a day (BID) | RESPIRATORY_TRACT | Status: DC
  Administered 2019-08-10 – 2019-08-12 (×4): 0.25 mg via RESPIRATORY_TRACT
  Filled 2019-08-10 (×4): qty 2

## 2019-08-10 MED ORDER — ENOXAPARIN SODIUM 40 MG/0.4ML ~~LOC~~ SOLN: 40.0000 mg | SUBCUTANEOUS | Status: DC

## 2019-08-10 MED ORDER — DOXYCYCLINE HYCLATE 100 MG PO TABS
100.0000 mg | ORAL_TABLET | Freq: Two times a day (BID) | ORAL | Status: DC
  Administered 2019-08-10 – 2019-08-12 (×4): 100 mg via ORAL
  Filled 2019-08-10 (×4): qty 1

## 2019-08-10 MED ORDER — METHYLPREDNISOLONE SODIUM SUCC 125 MG IJ SOLR: 125.0000 mg | Freq: Once | INTRAMUSCULAR | Status: DC

## 2019-08-10 MED ORDER — IPRATROPIUM-ALBUTEROL 0.5-2.5 (3) MG/3ML IN SOLN
3.0000 mL | Freq: Once | RESPIRATORY_TRACT | Status: AC
  Administered 2019-08-10: 3 mL via RESPIRATORY_TRACT

## 2019-08-10 MED ORDER — ACETAMINOPHEN 325 MG PO TABS: 650.0000 mg | ORAL_TABLET | Freq: Four times a day (QID) | ORAL | Status: DC | PRN

## 2019-08-10 MED ORDER — LISINOPRIL 10 MG PO TABS
10.0000 mg | ORAL_TABLET | Freq: Every day | ORAL | Status: DC
  Administered 2019-08-11 – 2019-08-12 (×2): 10 mg via ORAL
  Filled 2019-08-10 (×2): qty 1

## 2019-08-10 MED ORDER — IPRATROPIUM-ALBUTEROL 0.5-2.5 (3) MG/3ML IN SOLN
3.0000 mL | Freq: Four times a day (QID) | RESPIRATORY_TRACT | Status: DC
  Administered 2019-08-10 – 2019-08-12 (×6): 3 mL via RESPIRATORY_TRACT
  Filled 2019-08-10 (×7): qty 3

## 2019-08-10 MED ORDER — ONDANSETRON HCL 4 MG/2ML IJ SOLN: 4.0000 mg | Freq: Four times a day (QID) | INTRAMUSCULAR | Status: DC | PRN

## 2019-08-10 MED ORDER — SODIUM CHLORIDE 0.9% FLUSH
3.0000 mL | Freq: Two times a day (BID) | INTRAVENOUS | Status: DC
  Administered 2019-08-10 – 2019-08-12 (×4): 3 mL via INTRAVENOUS

## 2019-08-10 MED ORDER — IPRATROPIUM-ALBUTEROL 0.5-2.5 (3) MG/3ML IN SOLN
3.0000 mL | Freq: Once | RESPIRATORY_TRACT | Status: AC
  Administered 2019-08-10: 3 mL via RESPIRATORY_TRACT
  Filled 2019-08-10: qty 3

## 2019-08-10 MED ORDER — ONDANSETRON HCL 4 MG PO TABS: 4.0000 mg | ORAL_TABLET | Freq: Four times a day (QID) | ORAL | Status: DC | PRN

## 2019-08-10 MED ORDER — ALBUTEROL SULFATE (2.5 MG/3ML) 0.083% IN NEBU: 2.5000 mg | INHALATION_SOLUTION | RESPIRATORY_TRACT | Status: DC | PRN

## 2019-08-10 MED ORDER — SODIUM CHLORIDE 0.9% FLUSH: 3.0000 mL | INTRAVENOUS | Status: DC | PRN

## 2019-08-10 MED ORDER — MAGNESIUM SULFATE 2 GM/50ML IV SOLN
2.0000 g | Freq: Once | INTRAVENOUS | Status: AC
  Administered 2019-08-10: 2 g via INTRAVENOUS
  Filled 2019-08-10: qty 50

## 2019-08-10 MED ORDER — IPRATROPIUM-ALBUTEROL 0.5-2.5 (3) MG/3ML IN SOLN
3.0000 mL | Freq: Once | RESPIRATORY_TRACT | Status: AC
  Administered 2019-08-10: 3 mL via RESPIRATORY_TRACT
  Filled 2019-08-10: qty 6

## 2019-08-10 MED ORDER — HYDRALAZINE HCL 20 MG/ML IJ SOLN: 10.0000 mg | INTRAMUSCULAR | Status: DC | PRN

## 2019-08-10 MED ORDER — SODIUM CHLORIDE 0.9 % IV SOLN: 250.0000 mL | INTRAVENOUS | Status: DC | PRN

## 2019-08-10 MED ORDER — DOCUSATE SODIUM 100 MG PO CAPS
100.0000 mg | ORAL_CAPSULE | Freq: Two times a day (BID) | ORAL | Status: DC
  Administered 2019-08-10 – 2019-08-12 (×4): 100 mg via ORAL
  Filled 2019-08-10 (×4): qty 1

## 2019-08-10 MED ORDER — DEXAMETHASONE SODIUM PHOSPHATE 4 MG/ML IJ SOLN
4.0000 mg | Freq: Four times a day (QID) | INTRAMUSCULAR | Status: DC
  Administered 2019-08-10 – 2019-08-12 (×7): 4 mg via INTRAVENOUS
  Filled 2019-08-10 (×7): qty 1

## 2019-08-10 MED ORDER — DEXAMETHASONE SODIUM PHOSPHATE 10 MG/ML IJ SOLN
8.0000 mg | Freq: Once | INTRAMUSCULAR | Status: AC
  Administered 2019-08-10: 8 mg via INTRAVENOUS
  Filled 2019-08-10: qty 1

## 2019-08-10 MED ORDER — EZETIMIBE 10 MG PO TABS
10.0000 mg | ORAL_TABLET | Freq: Every day | ORAL | Status: DC
  Administered 2019-08-11 – 2019-08-12 (×2): 10 mg via ORAL
  Filled 2019-08-10 (×2): qty 1

## 2019-08-10 MED ORDER — ENOXAPARIN SODIUM 40 MG/0.4ML ~~LOC~~ SOLN
40.0000 mg | Freq: Two times a day (BID) | SUBCUTANEOUS | Status: DC
  Administered 2019-08-10 – 2019-08-11 (×3): 40 mg via SUBCUTANEOUS
  Filled 2019-08-10 (×4): qty 0.4

## 2019-08-10 MED ORDER — ACETAMINOPHEN 650 MG RE SUPP: 650.0000 mg | Freq: Four times a day (QID) | RECTAL | Status: DC | PRN

## 2019-08-10 NOTE — ED Provider Notes (Signed)
Patient received in signout from Dr. Cyril Loosen pending reassessment for COPD exacerbation.  Patient persistent lesion dyspneic with wheezing.  No hypoxia but significant tachypnea and tachycardia with even minimal exertion and ambulation.  Will order additional nebs.  Will consult hospitalist for further medical management.   Willy Eddy, MD 08/10/19 [phone]

## 2019-08-10 NOTE — Progress Notes (Signed)
Anticoagulation monitoring(Lovenox):  69 yo  F ordered Lovenox 40 mg Q24h  There were no vitals filed for this visit. BMI 43.9    Lab Results  Component Value Date   CREATININE 0.89 08/10/2019   CREATININE 0.99 05/23/2019   CREATININE 0.96 04/06/2019   Estimated Creatinine Clearance: 65 mL/min (by C-G formula based on SCr of 0.89 mg/dL). Hemoglobin & Hematocrit     Component Value Date/Time   HGB 14.6 08/10/2019 1105   HCT 43.6 08/10/2019 1105     Per Protocol for Patient with estCrcl > 30 ml/min and BMI > 40, will transition to Lovenox 40 mg Q12h.     Bari Mantis PharmD Clinical Pharmacist 08/10/2019

## 2019-08-10 NOTE — ED Notes (Signed)
Pt sleeping in recliner, easily aroused. Pt updated and VS reassessed.

## 2019-08-10 NOTE — ED Provider Notes (Signed)
[facility] Emergency Department Provider Note   ____________________________________________    I have reviewed the triage vital signs and the nursing notes.   HISTORY  Chief Complaint Shortness of Breath     HPI Lauren Koch is a 69 y.o. female with a history of COPD who uses home oxygen as needed who presents with complaints of shortness of breath.  Patient reports she developed worsening wheezing over the last 1 to 2 days.  She denies fevers or chills.  Occasional dry cough.  No recent travel.  No calf pain or swelling.  Has not had Covid vaccine.  No nausea vomiting or diaphoresis.  Mild chest tightness.  She reports he no longer smokes.  Review of medical records demonstrates AT encounter at outside hospital on May 23, 2019 for chest pain.  Past Medical History:  Diagnosis Date   Asthma    Cataract    COPD (chronic obstructive pulmonary disease) (HCC)    Cryptogenic Stroke (HCC)    a. 03/2016 L ACA, L MCX, L PCA, and R dorsal pontine territory infarcts; b. 03/2016 Carotid U/S: 1-39% bil dzs; c. 03/2016 TEE: EF 60-65%, no rwma, no LA/LAA/RA/RAA thrombus. No PFO; d. 03/2016 s/p MDT Reveal Linq (ser# [id number]) - no AFib noted to date (some sinus w/ freq PACs).   Dementia (HCC)    Diastolic dysfunction    a. 03/2016 Echo: EF 60-65%, gr1 DD, nl RV fxn.   GERD (gastroesophageal reflux disease)    Hypertension    Smoker    Vitamin D deficiency     Patient Active Problem List   Diagnosis Date Noted   Upper respiratory tract infection 08/10/2019   COPD (chronic obstructive pulmonary disease) with acute bronchitis (HCC) 04/03/2018   Physical deconditioning 12/21/2017   Urinary incontinence 12/21/2017   Acute on chronic respiratory failure with hypoxia (HCC) 11/26/2017   Dyspnea 11/25/2017   Tachycardia 11/25/2017   Syncope 06/24/2017   Memory loss 06/10/2017   Abnormal thyroid function test 06/10/2017   Breast cyst, left  06/10/2017   Hyperlipemia 03/25/2016   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 03/20/2016   Osteopenia 08/08/2015   Pulmonary emphysema (HCC)    Abnormal TSH 05/30/2015   Acute respiratory failure with hypoxia (HCC) 05/29/2015   COPD exacerbation (HCC) 05/29/2015   Acute kidney injury (HCC) 05/29/2015   Morbid obesity (HCC) 05/29/2015   Tobacco abuse 05/29/2015   Right leg pain 05/29/2015   Essential hypertension 05/29/2015   Vitamin D deficiency 04/17/2015   Smoker 03/03/2015   Asthma    Cataract    COPD (chronic obstructive pulmonary disease) (HCC)    GERD (gastroesophageal reflux disease)     Past Surgical History:  Procedure Laterality Date   ABDOMINAL HYSTERECTOMY     partial    CESAREAN SECTION  1971/1976   HERNIA REPAIR  2013   LOOP RECORDER INSERTION N/A 03/23/2016   Procedure: Loop Recorder Insertion;  [name], MD;  [name];  Service: Cardiovascular;  [name];   PARTIAL HYMENECTOMY     TEE WITHOUT CARDIOVERSION N/A 03/23/2016   Procedure: TRANSESOPHAGEAL ECHOCARDIOGRAM (TEE);  [name], MD;  Location: [facility] ENDOSCOPY;  Service: Cardiovascular;  [name];    Prior to Admission medications   Medication Sig Start Date End Date Taking? Authorizing Provider  albuterol ([name] Inhale 1-2 puffs into the lungs every 6 (six) hours  as needed for wheezing or shortness of breath. 05/11/19   McLean-Scocuzza, Pasty Spillers, MD  atorvastatin (LIPITOR) 40 MG tablet [name] 05/11/19   McLean-Scocuzza, Pasty Spillers, MD  donepezil (ARICEPT) 5 MG tablet [name] 05/11/19   McLean-Scocuzza, Pasty Spillers, MD  ezetimibe (ZETIA) 10 MG tablet Take 1 tablet (10 mg total) by mouth daily. 05/11/19   McLean-Scocuzza, Pasty Spillers, MD  Fluticasone-Umeclidin-Vilant (TRELEGY ELLIPTA)  100-62.5-25 MCG/INH AEPB Inhale 1 puff into the lungs daily. Rinse mouth 05/11/19   McLean-Scocuzza, Pasty Spillers, MD  hydrochlorothiazide (MICROZIDE) 12.5 MG capsule Take 1 capsule (12.5 mg total) by mouth daily. 05/11/19   McLean-Scocuzza, Pasty Spillers, MD  ibuprofen (ADVIL) 200 MG tablet Take 200 mg by mouth every 6 (six) hours as needed for fever or moderate pain.    [provider]  ipratropium-albuterol (DUONEB) 0.5-2.5 (3) MG/3ML SOLN Take 3 mLs by nebulization every 6 (six) hours as needed (for wheezing/shortness of breath). 05/11/19   McLean-Scocuzza, Pasty Spillers, MD  lisinopril (ZESTRIL) 10 MG tablet Take 1 tablet (10 mg total) by mouth daily. Not reduced dose 10 mg so take 1 pill not 1/2 pill 05/11/19   McLean-Scocuzza, Pasty Spillers, MD     Allergies Prednisone  Family History  Problem Relation Age of Onset   Diabetes Mother    Hypertension Mother    Miscarriages / [place] Mother    Hyperlipidemia Mother    Heart disease Father    Diabetes Father    Hypertension Father    Hyperlipidemia Father    Stroke Father    Cancer Sister        skin   Diabetes Other    Hyperlipidemia Daughter     Social History Social History   Tobacco Use   Smoking status: Former Smoker    Packs/day: 1.00    Years: 48.00    Pack years: 48.00    Types: Cigarettes    Quit date: 12/02/2017    Years since quitting: 1.6   Smokeless tobacco: Former Neurosurgeon    Quit date: 05/29/2015  Vaping Use   Vaping Use: Never used  Substance Use Topics   Alcohol use: Not Currently    Alcohol/week: 0.0 standard drinks   Drug use: No    Review of Systems  Constitutional: No fever/chills Eyes: No visual changes.  ENT: No sore throat. Cardiovascular: As above Respiratory: As above Gastrointestinal: No abdominal pain.  No nausea, no vomiting.   Genitourinary: Negative for dysuria. Musculoskeletal: Negative for back pain. Skin: Negative for rash. Neurological: Negative for headaches or  weakness   ____________________________________________   PHYSICAL EXAM:  VITAL SIGNS: ED Triage Vitals  Enc Vitals Group     BP 08/10/19 1106 (!) 169/88     Pulse Rate 08/10/19 1106 73     Resp 08/10/19 1106 (!) 24     Temp 08/10/19 1106 98.6 F (37 C)     Temp Source 08/10/19 1106 Oral     SpO2 08/10/19 1106 96 %     Weight --      Height --      Head Circumference --      Peak Flow --      Pain Score 08/10/19 1107 0     Pain Loc --      Pain Edu? --      Excl. in GC? --     Constitutional: Alert and  oriented.   Nose: No congestion/rhinnorhea. Mouth/Throat: Mucous membranes are moist.   Neck:  Painless ROM Cardiovascular: Normal rate, regular rhythm. Grossly normal heart sounds.  Good peripheral circulation. Respiratory: Increased respiratory effort with tachypnea, no retractions, diffuse wheezing Gastrointestinal: Soft and nontender. No distention.   Musculoskeletal: Warm and well perfused, no calf pain or edema Neurologic:  Normal speech and language. No gross focal neurologic deficits are appreciated.  Skin:  Skin is warm, dry and intact. No rash noted. Psychiatric: Mood and affect are normal. Speech and behavior are normal.  ____________________________________________   LABS (all labs ordered are listed, but only abnormal results are displayed)  Labs Reviewed  CBC - Abnormal; Notable for the following components:      Result Value   RBC 5.32 (*)    All other components within normal limits  BASIC METABOLIC PANEL - Abnormal; Notable for the following components:   Glucose, Bld 107 (*)    Calcium 8.7 (*)    All other components within normal limits  TROPONIN I (HIGH SENSITIVITY)  TROPONIN I (HIGH SENSITIVITY)   ____________________________________________  EKG  ED ECG REPORT I, Jene Every, the attending physician, personally viewed and interpreted this ECG.  Date: 08/10/2019  Rhythm: normal sinus rhythm QRS Axis: normal Intervals:  normal ST/T Wave abnormalities: Nonspecific T wave changes Narrative Interpretation: no evidence of acute ischemia  ____________________________________________  RADIOLOGY  Chest x-ray reviewed by me, no evidence of pneumonia or edema ____________________________________________   PROCEDURES  Procedure(s) performed: No  Procedures   Critical Care performed: No ____________________________________________   INITIAL IMPRESSION / ASSESSMENT AND PLAN / ED COURSE  Pertinent labs & imaging results that were available during my care of the patient were reviewed by me and considered in my medical decision making (see chart for details).  Patient with a history of COPD presents with wheezing likely related to COPD exacerbation.  Differential includes pneumonia, viral illness, Covid however patient is afebrile without myalgias, loss of taste or smell.  We will treat with multiple duo nebs, apparently has an allergy to prednisone but has tolerated Decadron in the past so we will give IV Decadron.  We will also give IV magnesium.  I have asked my colleague to reevaluate the patient after treatment to determine whether she will require admission    ____________________________________________   FINAL CLINICAL IMPRESSION(S) / ED DIAGNOSES  Final diagnoses:  COPD exacerbation (HCC)        Note:  This document was prepared using Dragon voice recognition software and may include unintentional dictation errors.   Jene Every, MD 08/10/19 1453

## 2019-08-10 NOTE — ED Notes (Signed)
Pt ambulated by this RN. Pt's O2 sats noted to stay 93-94%, pt however noted to become severely SOB, RR increased to 40 and HR increased to 120 after ambulation. EDP made aware at this time.

## 2019-08-10 NOTE — H&P (Signed)
[facility] History and Physical  [name] LNL:[id number] DOB: [birth year] DOA: 08/10/2019   PCP: McLean-Scocuzza, Lauren Spillers, MD  Specialists: None.  Patient doesn't know if she is followed by a pulmonologist.  Chief Complaint: Shortness of breath since yesterday  HPI: Lauren Koch is a 69 y.o. female with a past medical history of COPD who uses oxygen intermittently at home, history of essential hypertension, history of diastolic dysfunction, former smoker, history of cryptogenic stroke who lives with her daughter.  She mentions that she was in her usual state of health till yesterday when she started developing shortness of breath with wheezing.  Initially was with exertion subsequently even at rest.  Denies any chest pain.  Has had a dry cough.  No nausea or vomiting.  No abdominal pain.  Denies any fever or chills.  No sick contacts.  She took her nebulizer treatments at home without relief.  So she decided to come into the emergency department today.  She does not use her inhalers on a daily basis.  In the emergency department patient was given nebulizer treatment and steroids.  Chest x-ray did not show any acute findings.  Despite maximal treatment patient did not improved.  She will be brought in to the hospital to continue management.  Home Medications: Prior to Admission medications   Medication Sig Start Date End Date Taking? Authorizing Provider  ezetimibe (ZETIA) 10 MG tablet Take 1 tablet (10 mg total) by mouth daily. 05/11/19  Yes McLean-Scocuzza, Lauren Spillers, MD  Fluticasone-Umeclidin-Vilant (TRELEGY ELLIPTA) 100-62.5-25 MCG/INH AEPB Inhale 1 puff into the lungs daily. Rinse mouth 05/11/19  Yes McLean-Scocuzza, Lauren Spillers, MD  hydrochlorothiazide (MICROZIDE) 12.5 MG capsule Take 1 capsule (12.5 mg total) by mouth daily. 05/11/19  Yes McLean-Scocuzza, Lauren Spillers, MD  lisinopril (ZESTRIL) 10 MG tablet Take 1 tablet (10 mg total) by mouth daily. Not reduced dose 10 mg so take 1 pill not  1/2 pill 05/11/19  Yes McLean-Scocuzza, Lauren Spillers, MD  albuterol ([name] Inhale 1-2 puffs into the lungs every 6 (six) hours as needed for wheezing or shortness of breath. 05/11/19   McLean-Scocuzza, Lauren Spillers, MD  atorvastatin (LIPITOR) 40 MG tablet [name] Patient not taking: Reported on 08/10/2019 05/11/19   McLean-Scocuzza, Lauren Spillers, MD  donepezil (ARICEPT) 5 MG tablet [name] Patient not taking: Reported on 08/10/2019 05/11/19   McLean-Scocuzza, Lauren Spillers, MD  ibuprofen (ADVIL) 200 MG tablet Take 200 mg by mouth every 6 (six) hours as needed for fever or moderate pain.    [provider]  ipratropium-albuterol (DUONEB) 0.5-2.5 (3) MG/3ML SOLN Take 3 mLs by nebulization every 6 (six) hours as needed (for wheezing/shortness of breath). 05/11/19   McLean-Scocuzza, Lauren Spillers, MD    Allergies:  Allergies  Allergen Reactions  . Prednisone Rash and Other (See Comments)    Arrhythmia, also (Has tolerated hydrocortisone and decadron)     Past Medical History: Past Medical History:  Diagnosis Date  . Asthma   . Cataract   . COPD (chronic obstructive pulmonary disease) (HCC)   . Cryptogenic Stroke (HCC)    a. 03/2016 L ACA, L MCX, L PCA, and R dorsal pontine territory infarcts; b. 03/2016 Carotid U/S: 1-39% bil dzs; c. 03/2016 TEE: EF 60-65%, no rwma, no LA/LAA/RA/RAA thrombus. No PFO; d. 03/2016 s/p MDT Reveal Linq (ser# [id number]) - no AFib  noted to date (some sinus w/ freq PACs).  . Dementia (HCC)   . Diastolic dysfunction    a. 03/2016 Echo: EF 60-65%, gr1 DD, nl RV fxn.  Marland Kitchen. GERD (gastroesophageal reflux disease)   . Hypertension   . Smoker   . Vitamin D deficiency     Past Surgical History:  Procedure Laterality Date  . ABDOMINAL HYSTERECTOMY     partial   . CESAREAN SECTION  1971/1976  . HERNIA REPAIR  2013  . LOOP RECORDER INSERTION N/A 03/23/2016   Procedure: Loop Recorder  Insertion;  Surgeon: Marinus MawGregg W Taylor, MD;  [name];  Service: Cardiovascular;  [name];  . PARTIAL HYMENECTOMY    . TEE WITHOUT CARDIOVERSION N/A 03/23/2016   Procedure: TRANSESOPHAGEAL ECHOCARDIOGRAM (TEE);  Surgeon: Chilton Siiffany [place]point, MD;  [facility];  Service: Cardiovascular;  [name];    Social History: Lives with her daughter.  Former smoker.  No alcohol use.  No illicit drug use.  Occasionally uses a cane to ambulate.  Family History:  Family History  Problem Relation Age of Onset  . Diabetes Mother   . Hypertension Mother   . Miscarriages / [place] Mother   . Hyperlipidemia Mother   . Heart disease Father   . Diabetes Father   . Hypertension Father   . Hyperlipidemia Father   . Stroke Father   . Cancer Sister        skin  . Diabetes Other   . Hyperlipidemia Daughter      Review of Systems - History obtained from the patient General ROS: positive for  - fatigue Psychological ROS: negative Ophthalmic ROS: negative ENT ROS: negative Allergy and Immunology ROS: negative Hematological and Lymphatic ROS: negative Endocrine ROS: negative Respiratory ROS: As in HPI Cardiovascular ROS: As in HPI Gastrointestinal ROS: no abdominal pain, change in bowel habits, or black or bloody stools Genito-Urinary ROS: no dysuria, trouble voiding, or hematuria Musculoskeletal ROS: negative Neurological ROS: no TIA or stroke symptoms Dermatological ROS: negative  Physical Examination  Vitals:   08/10/19 1530 08/10/19 1600 08/10/19 1630 08/10/19 1731  BP: (!) 142/73 (!) 186/91 (!) 161/80 (!) 167/84  Pulse: 71 87 71 81  Resp: 15 (!) 28 15 18   Temp:      TempSrc:      SpO2: 94% 96% 100% 94%    BP (!) 167/84 (BP Location: Right Arm)   Pulse 81   Temp 98.6 F (37 C) (Oral)   Resp 18   SpO2 94%   General appearance: alert, cooperative, appears stated age and no distress Head: Normocephalic, without obvious abnormality,  atraumatic Eyes: conjunctivae/corneas clear. PERRL, EOM's intact.  Throat: lips, mucosa, and tongue normal; teeth and gums normal Neck: no adenopathy, no carotid bruit, no JVD, supple, symmetrical, trachea midline and thyroid not enlarged, symmetric, no tenderness/mass/nodules Resp: tachypneic, wheezing bilaterally, few crackles at bases, no rhonchi Cardio: regular rate and rhythm, S1, S2 normal, no murmur, click, rub or gallop GI: soft, non-tender; bowel sounds normal; no masses,  no organomegaly Extremities: extremities normal, atraumatic, no cyanosis or edema Pulses: 2+ and symmetric Skin: Skin color, texture, turgor normal. No rashes or lesions Lymph nodes: Cervical, supraclavicular, and axillary nodes normal. Neurologic: A&Ox3. No focal deficits.  Labs on Admission: I have personally reviewed following labs and imaging studies  CBC: Recent Labs  Lab 08/10/19 1105  WBC 9.2  HGB 14.6  HCT 43.6  MCV 82.0  PLT 303   Basic Metabolic Panel: Recent Labs  Lab 08/10/19 1105  NA 136  K 4.5  CL 102  CO2 24  GLUCOSE 107*  BUN 12  CREATININE 0.89  CALCIUM 8.7*   GFR: Estimated Creatinine Clearance: 65 mL/min (by C-G formula based on SCr of 0.89 mg/dL).  Radiological Exams on Admission: DG Chest 2 View  Result Date: 08/10/2019 CLINICAL DATA:  Shortness of breath. EXAM: CHEST - 2 VIEW COMPARISON:  May 23, 2019 FINDINGS: The heart size and mediastinal contours are stable. Both lungs are clear. The visualized skeletal structures are unremarkable. IMPRESSION: No active cardiopulmonary disease. Electronically Signed   By: Sherian Rein M.D.   On: 08/10/2019 12:31    My interpretation of Electrocardiogram: sinus rhythm in the 70s. Normal axis. Nonspecific T wave changes. No concerning ischemic changes noted.   Problem List  Principal Problem:   COPD with acute exacerbation (HCC) Active Problems:   Morbid obesity (HCC)   Essential hypertension   Assessment: This is a  69 year old Caucasian female with past medical history of COPD and hypertension who comes in with worsening shortness of breath over the past 24 hours. Despite taking nebulizer treatments at home she did not improve and so she decided to come to the emergency department. She has not improved with treatment in the ED and will need to be brought in for further management. Patient is noncompliant with her inhaler treatment at home.  Plan:  1. Acute COPD exacerbation: Patient will be brought in under observation status. She'll be given steroids. She notes allergy to prednisone so she was given dexamethasone in the ED which will be continued. She was given nebulizer bronchodilators, nebulized budesonide and will be started on doxycycline as well. Chest x-ray reviewed and does not show any acute findings. She was asked to be compliant with her home inhaler maintenance treatment. Incentive spirometry.  2. Essential hypertension: Continue with the lisinopril. Hydralazine as needed.  3. Morbid obesity: Estimated body mass index is 43.94 kg/m as calculated from the following:   Height as of an earlier encounter on 08/10/19: 5' (1.524 m).   Weight as of an earlier encounter on 08/10/19: 102.1 kg.     DVT Prophylaxis: Lovenox Code Status: Full code Family Communication: Discussed with the patient Disposition: Hopefully return home in improved Consults called: None Admission Status: Status is: Observation  The patient remains OBS appropriate and will d/c before 2 midnights.  Dispo: The patient is from: Home              Anticipated d/c is to: Home              Anticipated d/c date is: 1 day              Patient currently is not medically stable to d/c.    Severity of Illness: The appropriate patient status for this patient is OBSERVATION. Observation status is judged to be reasonable and necessary in order to provide the required intensity of service to ensure the patient's safety. The patient's  presenting symptoms, physical exam findings, and initial radiographic and laboratory data in the context of their medical condition is felt to place them at decreased risk for further clinical deterioration. Furthermore, it is anticipated that the patient will be medically stable for discharge from the hospital within 2 midnights of admission. The following factors support the patient status of observation.   " The patient's presenting symptoms include shortness of breath and wheezing. " The physical exam findings include tachypnea, wheezing. " The initial radiographic and  laboratory data are unremarkable.   Further management decisions will depend on results of further testing and patient's response to treatment.   Caffie Sotto [employer]  [facility] [employer] on [email or web address]  08/10/2019, 5:34 PM

## 2019-08-10 NOTE — Progress Notes (Signed)
IS  giving to pt education provided. Returned demonstration.

## 2019-08-10 NOTE — ED Triage Notes (Addendum)
Pt c/o SOB since yesterday, pt states she has COPD and on oxygen PRN. Pt has audible wheezing/tachypnea on arrival.

## 2019-08-10 NOTE — Progress Notes (Addendum)
Virtual Visit via Video and was note  This visit type was conducted due to national recommendations for restrictions regarding the COVID-19 pandemic (e.g. social distancing).  This format is felt to be most appropriate for this patient at this time.  All issues noted in this document were discussed and addressed.  No physical exam was performed (except for noted visual exam findings with Video Visits).   I connected with@ on 08/10/19 at  9:00 AM EDT by a video enabled telemedicine application or telephone and verified that I am speaking with the correct person using two identifiers. Location patient: home Location provider: work  office Persons participating in the virtual visit: patient, provider, granddaughter Marca Ancona   I discussed the limitations, risks, security and privacy concerns of performing an evaluation and management service by telephone and the availability of in person appointments. I also discussed with the patient that there may be a patient responsible charge related to this service. The patient expressed understanding and agreed to proceed.  Reason for visit: cough, sob, No Covid vaccine or test.   HPI: Cough for 2 days, headache, did not check for fever. Positive DOE.  She has noted no fevers or chills but has not checked her temperature.  She does have a headache, some generalized body ache.  She has no diarrhea, abdominal pain, nausea vomiting, loss of taste or smell.  She is having some wheezing, and is more short of breath than usual. Positive DOE . No SOB at rest.  She has Duo neb, Ellipta,  and albuterol to use as needed. This is not helping the frequent cough. Former smoker -quit date 12/02/2017.    ROS: See pertinent positives and negatives per HPI.  Past Medical History:  Diagnosis Date  . Asthma   . Cataract   . COPD (chronic obstructive pulmonary disease) (HCC)   . Cryptogenic Stroke (HCC)    a. 03/2016 L ACA, L MCX, L PCA, and R dorsal pontine territory  infarcts; b. 03/2016 Carotid U/S: 1-39% bil dzs; c. 03/2016 TEE: EF 60-65%, no rwma, no LA/LAA/RA/RAA thrombus. No PFO; d. 03/2016 s/p MDT Reveal Linq (ser# [id number]) - no AFib noted to date (some sinus w/ freq PACs).  . Dementia (HCC)   . Diastolic dysfunction    a. 03/2016 Echo: EF 60-65%, gr1 DD, nl RV fxn.  Marland Kitchen GERD (gastroesophageal reflux disease)   . Hypertension   . Smoker   . Vitamin D deficiency     Past Surgical History:  Procedure Laterality Date  . ABDOMINAL HYSTERECTOMY     partial   . CESAREAN SECTION  1971/1976  . HERNIA REPAIR  2013  . LOOP RECORDER INSERTION N/A 03/23/2016   Procedure: Loop Recorder Insertion;  [name], MD;  [name];  Service: Cardiovascular;  [name];  . PARTIAL HYMENECTOMY    . TEE WITHOUT CARDIOVERSION N/A 03/23/2016   Procedure: TRANSESOPHAGEAL ECHOCARDIOGRAM (TEE);  [name], MD;  Location: [facility] ENDOSCOPY;  Service: Cardiovascular;  [name];    Family History  Problem Relation Age of Onset  . Diabetes Mother   . Hypertension Mother   . Miscarriages / [place] Mother   . Hyperlipidemia Mother   . Heart disease Father   . Diabetes Father   . Hypertension Father   . Hyperlipidemia Father   . Stroke Father   . Cancer Sister        skin  . Diabetes Other   . Hyperlipidemia Daughter  Current Outpatient Medications:  .  albuterol ([name], Inhale 1-2 puffs into the lungs every 6 (six) hours as needed for wheezing or shortness of breath., [name] 54 g, Rfl: 3 .  atorvastatin (LIPITOR) 40 MG tablet, [name], [name], Rfl: 0 .  donepezil (ARICEPT) 5 MG tablet, [name], [name], Rfl: 0 .  ezetimibe (ZETIA) 10 MG tablet, Take 1 tablet (10 mg total) by mouth daily., [name], Rfl: 0 .  Fluticasone-Umeclidin-Vilant (TRELEGY ELLIPTA)  100-62.5-25 MCG/INH AEPB, Inhale 1 puff into the lungs daily. Rinse mouth, [name] 180 each, Rfl: 4 .  hydrochlorothiazide (MICROZIDE) 12.5 MG capsule, Take 1 capsule (12.5 mg total) by mouth daily., [name] 90 capsule, Rfl: 0 .  ibuprofen (ADVIL) 200 MG tablet, Take 200 mg by mouth every 6 (six) hours as needed for fever or moderate pain., [name] , Rfl:  .  ipratropium-albuterol (DUONEB) 0.5-2.5 (3) MG/3ML SOLN, Take 3 mLs by nebulization every 6 (six) hours as needed (for wheezing/shortness of breath)., [name] 1080 mL, Rfl: 4 .  lisinopril (ZESTRIL) 10 MG tablet, Take 1 tablet (10 mg total) by mouth daily. Not reduced dose 10 mg so take 1 pill not 1/2 pill, [name], Rfl: 0  EXAM:  VITALS per patient if applicable: 98.2  GENERAL: alert, oriented, appears very pale and ill. Fatigued.   HEENT: atraumatic, conjunctiva clear, no obvious abnormalities on inspection of external nose and ears  NECK: normal movements of the head and neck  LUNGS: She has a frequent cough-congested throughout the video, purse lip breathing, rapid breathing, breathiness with speaking, no audible wheezing. She denies SOB at rest but is unable to speak in full sentences. She has DOE with minimal walking. No  gasping or wheezing. Grand daughter with her and reports this is a little worse than her baseline.  CV: no obvious cyanosis  MS: moves all visible extremities without noticeable abnormality  PSYCH/NEURO: pleasant and cooperative, no obvious depression or anxiety, speech and thought processing grossly intact  ASSESSMENT AND PLAN:  Discussed the following assessment and plan:  Upper respiratory tract infection, unspecified type  COPD (chronic obstructive pulmonary disease) with acute bronchitis (HCC)  Tobacco abuse  Dyspnea, unspecified type  No problem-specific Assessment & Plan notes found for this encounter.  Patient has acute upper respiratory infection symptoms started 2 days ago. She has been  taking Mucinex and allergy medication without improvement.  She is having shortness of breath, with pursed lip breathing, breathiness, unable to complete a full sentence.  She says that she is about at her baseline, but she is having more  dyspnea than baseline.  She has had no Covid vaccines or Covid testing.   I am concerned about possibility of pneumonia, or  Covid infection  and acute on chronic COPD.  She reports her pulse ox now is 94%.  She is not utilizing supplemental oxygen.  She does have inhalers to use at home.  No recent antibiotic therapy.   I recommend in- person evaluation at [facility] clinic. The patient does not think she can walk very far and her granddaughter will not be able to accompany her.  Therefore, I recommend she go to the emergency department where she can be dropped off at the door and brought in via wheelchair for an  In-person evaluation to rule out Covid infection, pneumonia,  and to get a lung exam and CXR to determine if she can be  treated safely as an outpatient versus inpatient. The patient and her granddaughter are agreeable.     I discussed the assessment and treatment plan with the patient. The patient was provided an opportunity to ask questions and all were answered. The patient agreed with the plan and demonstrated an understanding of the instructions.   The patient was advised to call back or seek an in-person evaluation if the symptoms worsen or if the condition fails to improve as anticipated.   Amedeo Kinsman, NP Adult Nurse Practitioner [facility] [employer] [phone]

## 2019-08-11 ENCOUNTER — Other Ambulatory Visit: Payer: Self-pay

## 2019-08-11 DIAGNOSIS — Z79899 Other long term (current) drug therapy: Secondary | ICD-10-CM | POA: Diagnosis not present

## 2019-08-11 DIAGNOSIS — Z888 Allergy status to other drugs, medicaments and biological substances status: Secondary | ICD-10-CM | POA: Diagnosis not present

## 2019-08-11 DIAGNOSIS — Z823 Family history of stroke: Secondary | ICD-10-CM | POA: Diagnosis not present

## 2019-08-11 DIAGNOSIS — Z8249 Family history of ischemic heart disease and other diseases of the circulatory system: Secondary | ICD-10-CM | POA: Diagnosis not present

## 2019-08-11 DIAGNOSIS — I10 Essential (primary) hypertension: Secondary | ICD-10-CM | POA: Diagnosis present

## 2019-08-11 DIAGNOSIS — Z87891 Personal history of nicotine dependence: Secondary | ICD-10-CM | POA: Diagnosis not present

## 2019-08-11 DIAGNOSIS — Z9981 Dependence on supplemental oxygen: Secondary | ICD-10-CM | POA: Diagnosis not present

## 2019-08-11 DIAGNOSIS — Z20822 Contact with and (suspected) exposure to covid-19: Secondary | ICD-10-CM | POA: Diagnosis present

## 2019-08-11 DIAGNOSIS — T380X5A Adverse effect of glucocorticoids and synthetic analogues, initial encounter: Secondary | ICD-10-CM | POA: Diagnosis not present

## 2019-08-11 DIAGNOSIS — Z9119 Patient's noncompliance with other medical treatment and regimen: Secondary | ICD-10-CM | POA: Diagnosis not present

## 2019-08-11 DIAGNOSIS — E559 Vitamin D deficiency, unspecified: Secondary | ICD-10-CM | POA: Diagnosis present

## 2019-08-11 DIAGNOSIS — Z83438 Family history of other disorder of lipoprotein metabolism and other lipidemia: Secondary | ICD-10-CM | POA: Diagnosis not present

## 2019-08-11 DIAGNOSIS — J9601 Acute respiratory failure with hypoxia: Secondary | ICD-10-CM | POA: Diagnosis present

## 2019-08-11 DIAGNOSIS — Z6841 Body Mass Index (BMI) 40.0 and over, adult: Secondary | ICD-10-CM | POA: Diagnosis not present

## 2019-08-11 DIAGNOSIS — K219 Gastro-esophageal reflux disease without esophagitis: Secondary | ICD-10-CM | POA: Diagnosis present

## 2019-08-11 DIAGNOSIS — Z8673 Personal history of transient ischemic attack (TIA), and cerebral infarction without residual deficits: Secondary | ICD-10-CM | POA: Diagnosis not present

## 2019-08-11 DIAGNOSIS — F0390 Unspecified dementia without behavioral disturbance: Secondary | ICD-10-CM | POA: Diagnosis present

## 2019-08-11 DIAGNOSIS — E785 Hyperlipidemia, unspecified: Secondary | ICD-10-CM | POA: Diagnosis present

## 2019-08-11 DIAGNOSIS — Z833 Family history of diabetes mellitus: Secondary | ICD-10-CM | POA: Diagnosis not present

## 2019-08-11 DIAGNOSIS — J441 Chronic obstructive pulmonary disease with (acute) exacerbation: Secondary | ICD-10-CM | POA: Diagnosis present

## 2019-08-11 DIAGNOSIS — H269 Unspecified cataract: Secondary | ICD-10-CM | POA: Diagnosis present

## 2019-08-11 LAB — CBC
HCT: 39.7 % (ref 36.0–46.0)
Hemoglobin: 13.5 g/dL (ref 12.0–15.0)
MCH: 27.1 pg (ref 26.0–34.0)
MCHC: 34 g/dL (ref 30.0–36.0)
MCV: 79.7 fL — ABNORMAL LOW (ref 80.0–100.0)
Platelets: 295 10*3/uL (ref 150–400)
RBC: 4.98 MIL/uL (ref 3.87–5.11)
RDW: 14.3 % (ref 11.5–15.5)
WBC: 6.3 10*3/uL (ref 4.0–10.5)
nRBC: 0 % (ref 0.0–0.2)

## 2019-08-11 LAB — BASIC METABOLIC PANEL
Anion gap: 9 (ref 5–15)
BUN: 16 mg/dL (ref 8–23)
CO2: 27 mmol/L (ref 22–32)
Calcium: 8.9 mg/dL (ref 8.9–10.3)
Chloride: 102 mmol/L (ref 98–111)
Creatinine, Ser: 0.72 mg/dL (ref 0.44–1.00)
GFR calc Af Amer: >60 mL/min (ref >60)
GFR calc non Af Amer: >60 mL/min (ref >60)
Glucose, Bld: 158 mg/dL — ABNORMAL HIGH (ref 70–99)
Potassium: 4.2 mmol/L (ref 3.5–5.1)
Sodium: 138 mmol/L (ref 135–145)

## 2019-08-11 LAB — HIV ANTIBODY (ROUTINE TESTING W REFLEX): HIV Screen 4th Generation wRfx: NONREACTIVE

## 2019-08-11 MED ORDER — FUROSEMIDE 10 MG/ML IJ SOLN
40.0000 mg | Freq: Once | INTRAMUSCULAR | Status: AC
  Administered 2019-08-11: 12:00:00 40 mg via INTRAVENOUS
  Filled 2019-08-11: qty 4

## 2019-08-11 MED ORDER — ORAL CARE MOUTH RINSE
15.0000 mL | Freq: Two times a day (BID) | OROMUCOSAL | Status: DC
  Administered 2019-08-11 (×2): 15 mL via OROMUCOSAL

## 2019-08-11 NOTE — Progress Notes (Signed)
Pt ambulated near the nursing station without nasal canula.  Pt dyspneic with exertion, but O2 sats remained 97%.

## 2019-08-11 NOTE — Progress Notes (Addendum)
[facility] PROGRESS NOTE   Lauren Koch FXT:[id number] DOB: Feb 09, 1950 DOA: 08/10/2019  PCP: McLean-Scocuzza, Pasty Spillers, MD  Brief History/Interval Summary: 69 y.o. female with a past medical history of COPD who uses oxygen intermittently at home, history of essential hypertension, history of diastolic dysfunction, former smoker, history of cryptogenic stroke who lives with her daughter.  She mentioned that she was in her usual state of health till yesterday when she started developing shortness of breath with wheezing.  Initially was with exertion subsequently even at rest.  She took her nebulizer treatments at home without relief.  So she decided to come into the emergency department today.  She does not use her inhalers on a daily basis.  In the emergency department patient was given nebulizer treatment and steroids.  Chest x-ray did not show any acute findings.  Despite maximal treatment patient did not improved.  She will be brought in to the hospital to continue management.  Reason for Visit: Acute COPD exacerbation  Consultants: None  Procedures: None  Antibiotics: Anti-infectives (From admission, onward)   Start     Dose/Rate Route Frequency Ordered Stop   08/10/19 2000  doxycycline (VIBRA-TABS) tablet 100 mg     Discontinue     100 mg Oral Every 12 hours 08/10/19 1734        Subjective/Interval History: Patient states that she is feeling slightly better compared to yesterday but still not back to her baseline.  Still has wheezing some cough which is dry.  Denies any chest pain.  ROS: No nausea or vomiting    Assessment/Plan:  Acute COPD exacerbation with acute respiratory failure with hypoxia Patient does not use oxygen consistently at home although she does have it available.  She uses apparently as needed.  Patient was hospitalized for management of COPD exacerbation.  Patient is better though she is still not back to baseline.  She will benefit from another 24  hours of intensive treatment with systemic intravenous steroids and bronchodilators and nebulized budesonide.  Continue with doxycycline.  Chest x-ray did not show any acute findings.  Continue with incentive spirometry.  Lasix IV x1.  Essential hypertension Continue with lisinopril.  Hydralazine as needed.  Blood pressure readings noted to be elevated.  Steroids likely contributing.  Dyslipidemia Continue with home medication  Morbid obesity Estimated body mass index is 43.94 kg/m as calculated from the following:   Height as of an earlier encounter on 08/10/19: 5' (1.524 m).   Weight as of an earlier encounter on 08/10/19: 102.1 kg.   DVT Prophylaxis: Lovenox Code Status: Full code Family Communication: Discussed with the patient Disposition Plan:  Status is: Observation  The patient will require care spanning > 2 midnights and should be moved to inpatient because: IV treatments appropriate due to intensity of illness or inability to take PO  Dispo: The patient is from: Home              Anticipated d/c is to: Home              Anticipated d/c date is: 1 day              Patient currently is not medically stable to d/c.      Medications:  Scheduled: . budesonide (PULMICORT) nebulizer solution  0.25 mg Nebulization BID  . dexamethasone (DECADRON) injection  4 mg Intravenous Q6H  . docusate sodium  100 mg Oral BID  . doxycycline  100 mg Oral Q12H  . enoxaparin (  LOVENOX) injection  40 mg Subcutaneous Q12H  . ezetimibe  10 mg Oral Daily  . ipratropium-albuterol  3 mL Nebulization QID  . lisinopril  10 mg Oral Daily  . sodium chloride flush  3 mL Intravenous Q12H   Continuous: . sodium chloride     DXI:PJASNK chloride, acetaminophen **OR** acetaminophen, albuterol, hydrALAZINE, ondansetron **OR** ondansetron (ZOFRAN) IV, sodium chloride flush   Objective:  Vital Signs  Vitals:   08/10/19 1937 08/10/19 2351 08/11/19 0507 08/11/19 0756  BP:  (!) 150/92 (!) 161/88 (!)  146/67  Pulse:  71 62 (!) 53  Resp:  18 18 16   Temp:  97.7 F (36.5 C) 97.8 F (36.6 C) 98.2 F (36.8 C)  TempSrc:  Oral Oral   SpO2: 97% 92% 94% 96%    Intake/Output Summary (Last 24 hours) at 08/11/2019 1052 Last data filed at 08/10/2019 1508 Gross per 24 hour  Intake 50 ml  Output --  Net 50 ml   There were no vitals filed for this visit.  General appearance: Awake alert.  In no distress Resp: Tachypneic.  No use of accessory muscles.  Wheezing heard bilaterally.  Few crackles at the bases.  No rhonchi. Cardio: S1-S2 is normal regular.  No S3-S4.  No rubs murmurs or bruit GI: Abdomen is soft.  Nontender nondistended.  Bowel sounds are present normal.  No masses organomegaly Extremities: No edema.  Full range of motion of lower extremities. Neurologic: Alert and oriented x3.  No focal neurological deficits.    Lab Results:  Data Reviewed: I have personally reviewed following labs and imaging studies  CBC: Recent Labs  Lab 08/10/19 1105 08/11/19 0628  WBC 9.2 6.3  HGB 14.6 13.5  HCT 43.6 39.7  MCV 82.0 79.7*  PLT 303 295    Basic Metabolic Panel: Recent Labs  Lab 08/10/19 1105 08/11/19 0628  NA 136 138  K 4.5 4.2  CL 102 102  CO2 24 27  GLUCOSE 107* 158*  BUN 12 16  CREATININE 0.89 0.72  CALCIUM 8.7* 8.9    GFR: Estimated Creatinine Clearance: 72.4 mL/min (by C-G formula based on SCr of 0.72 mg/dL).   Recent Results (from the past 240 hour(s))  SARS Coronavirus 2 by RT PCR (hospital order, performed in [facility] hospital lab) Nasopharyngeal Nasopharyngeal Swab     Status: None   Collection Time: 08/10/19  4:00 PM   Specimen: Nasopharyngeal Swab  Result Value Ref Range Status   SARS Coronavirus 2 NEGATIVE NEGATIVE Final    Comment: (NOTE) SARS-CoV-2 target nucleic acids are NOT DETECTED.  The SARS-CoV-2 RNA is generally detectable in upper and lower respiratory specimens during the acute phase of infection. The lowest concentration of  SARS-CoV-2 viral copies this assay can detect is 250 copies / mL. A negative result does not preclude SARS-CoV-2 infection and should not be used as the sole basis for treatment or other patient management decisions.  A negative result may occur with improper specimen collection / handling, submission of specimen other than nasopharyngeal swab, presence of viral mutation(s) within the areas targeted by this assay, and inadequate number of viral copies (<250 copies / mL). A negative result must be combined with clinical observations, patient history, and epidemiological information.  Fact Sheet for Patients:   10/11/19  Fact Sheet for Healthcare Providers: BoilerBrush.com.cy  This test is not yet approved or  cleared by the https://pope.com/ FDA and has been authorized for detection and/or diagnosis of SARS-CoV-2 by FDA under an Emergency  Use Authorization (EUA).  This EUA will remain in effect (meaning this test can be used) for the duration of the COVID-19 declaration under Section 564(b)(1) of the Act, 21 U.S.C. section 360bbb-3(b)(1), unless the authorization is terminated or revoked sooner.  Performed at [facility], [address], [place], [place] [address]       Radiology Studies: DG Chest 2 View  Result Date: 08/10/2019 CLINICAL DATA:  Shortness of breath. EXAM: CHEST - 2 VIEW COMPARISON:  May 23, 2019 FINDINGS: The heart size and mediastinal contours are stable. Both lungs are clear. The visualized skeletal structures are unremarkable. IMPRESSION: No active cardiopulmonary disease. Electronically Signed   By: Sherian Rein M.D.   On: 08/10/2019 12:31       LOS: 0 days   Wells Gerdeman Rito Ehrlich  [facility] Pager on [email or web address]  08/11/2019, 10:52 AM

## 2019-08-12 LAB — BASIC METABOLIC PANEL
Anion gap: 9 (ref 5–15)
BUN: 31 mg/dL — ABNORMAL HIGH (ref 8–23)
CO2: 25 mmol/L (ref 22–32)
Calcium: 9.1 mg/dL (ref 8.9–10.3)
Chloride: 106 mmol/L (ref 98–111)
Creatinine, Ser: 0.92 mg/dL (ref 0.44–1.00)
GFR calc Af Amer: >60 mL/min (ref >60)
GFR calc non Af Amer: >60 mL/min (ref >60)
Glucose, Bld: 127 mg/dL — ABNORMAL HIGH (ref 70–99)
Potassium: 4.2 mmol/L (ref 3.5–5.1)
Sodium: 140 mmol/L (ref 135–145)

## 2019-08-12 MED ORDER — DOXYCYCLINE HYCLATE 100 MG PO TABS: 100.0000 mg | ORAL_TABLET | Freq: Two times a day (BID) | ORAL | 0 refills | Status: AC

## 2019-08-12 MED ORDER — DEXAMETHASONE 4 MG PO TABS
ORAL_TABLET | ORAL | 0 refills | Status: DC
Start: 2019-08-12 — End: 2022-10-03

## 2019-08-12 NOTE — Progress Notes (Signed)
Patient has been discharged home.  Discharge papers given and explained to pt.  Pt verbalized understanding.  Meds and f/u appointments reviewed.  Rx was sent electronically to pharmacy. Pt made aware.

## 2019-08-12 NOTE — Discharge Summary (Signed)
[facility]  Physician Discharge Summary   Patient ID: Lauren Koch MRN: [id number] DOB/AGE: [birth year] 69 y.o.  Admit date: 08/10/2019 Discharge date: 08/12/2019  PCP: McLean-Scocuzza, Pasty Spillers, MD  DISCHARGE DIAGNOSES:  Acute respiratory failure with hypoxia, resolved Acute COPD exacerbation, improved Essential hypertension Dyslipidemia Morbid obesity  RECOMMENDATIONS FOR OUTPATIENT FOLLOW UP: 1. Referral has been sent to [facility] pulmonology for follow-up    Home Health: None Equipment/Devices: None  CODE STATUS: Full code  DISCHARGE CONDITION: fair  Diet recommendation: As before  INITIAL HISTORY: 69 y.o.femalewith a past medical history of COPD who uses oxygen intermittently at home, history of essential hypertension, history of diastolic dysfunction, former smoker, history of cryptogenic stroke who lives with her daughter. She mentioned that she was in her usual state of health till yesterday when she started developing shortness of breath with wheezing. Initially was with exertion subsequently even at rest. She took her nebulizer treatments at home without relief. Soshe decided to come into the emergency department today. She does not useher inhalers on a daily basis.  In the emergency department patient was given nebulizer treatment and steroids. Chest x-ray did not show any acute findings. Despite maximal treatment patient did not improved. She will be brought in to the hospital to continue management.   HOSPITAL COURSE:   Acute COPD exacerbation with acute respiratory failure with hypoxia Patient does not use oxygen consistently at home although she does have it available.  She apparently uses oxygen as needed.  Patient was hospitalized for management of COPD exacerbation.   Patient started improving.  Initially plan was to keep it for 1 day however we had to extend her stay by 1 more day.  She is feeling better this morning.  She ambulated  yesterday though she is did get short of breath.  Patient mentions that she is close to her baseline and would like to go home.  Saturating normal on room air.  Okay for discharge home.  Will be discharged on steroid taper and antibacterials.   Essential hypertension Continue with lisinopril.    Elevated blood pressure in the hospital likely due to steroids.  Dyslipidemia Continue with home medication  Morbid obesity Estimated body mass index is 43.94 kg/m as calculated from the following:   Height as of an earlier encounter on 08/10/19: 5' (1.524 m).   Weight as of an earlier encounter on 08/10/19: 102.1 kg.  Overall stable.  Okay for discharge home today.   PERTINENT LABS:  The results of significant diagnostics from this hospitalization (including imaging, microbiology, ancillary and laboratory) are listed below for reference.    Microbiology: Recent Results (from the past 240 hour(s))  SARS Coronavirus 2 by RT PCR (hospital order, performed in [facility] hospital lab) Nasopharyngeal Nasopharyngeal Swab     Status: None   Collection Time: 08/10/19  4:00 PM   Specimen: Nasopharyngeal Swab  Result Value Ref Range Status   SARS Coronavirus 2 NEGATIVE NEGATIVE Final    Comment: (NOTE) SARS-CoV-2 target nucleic acids are NOT DETECTED.  The SARS-CoV-2 RNA is generally detectable in upper and lower respiratory specimens during the acute phase of infection. The lowest concentration of SARS-CoV-2 viral copies this assay can detect is 250 copies / mL. A negative result does not preclude SARS-CoV-2 infection and should not be used as the sole basis for treatment or other patient management decisions.  A negative result may occur with improper specimen collection / handling, submission of specimen other than nasopharyngeal swab, presence  of viral mutation(s) within the areas targeted by this assay, and inadequate number of viral copies (<250 copies / mL). A negative result must be  combined with clinical observations, patient history, and epidemiological information.  Fact Sheet for Patients:   BoilerBrush.com.cy  Fact Sheet for Healthcare Providers: https://pope.com/  This test is not yet approved or  cleared by the [place] FDA and has been authorized for detection and/or diagnosis of SARS-CoV-2 by FDA under an Emergency Use Authorization (EUA).  This EUA will remain in effect (meaning this test can be used) for the duration of the COVID-19 declaration under Section 564(b)(1) of the Act, 21 U.S.C. section 360bbb-3(b)(1), unless the authorization is terminated or revoked sooner.  Performed at [facility], [address] [place], [place] [address]      Labs:   Basic Metabolic Panel: Recent Labs  Lab 08/10/19 1105 08/11/19 0628 08/12/19 0635  NA 136 138 140  K 4.5 4.2 4.2  CL 102 102 106  CO2 24 27 25   GLUCOSE 107* 158* 127*  BUN 12 16 31*  CREATININE 0.89 0.72 0.92  CALCIUM 8.7* 8.9 9.1   CBC: Recent Labs  Lab 08/10/19 1105 08/11/19 0628  WBC 9.2 6.3  HGB 14.6 13.5  HCT 43.6 39.7  MCV 82.0 79.7*  PLT 303 295     IMAGING STUDIES DG Chest 2 View  Result Date: 08/10/2019 CLINICAL DATA:  Shortness of breath. EXAM: CHEST - 2 VIEW COMPARISON:  May 23, 2019 FINDINGS: The heart size and mediastinal contours are stable. Both lungs are clear. The visualized skeletal structures are unremarkable. IMPRESSION: No active cardiopulmonary disease. Electronically Signed   By: May 25, 2019 M.D.   On: 08/10/2019 12:31    DISCHARGE EXAMINATION: Vitals:   08/11/19 2018 08/11/19 2343 08/12/19 0459 08/12/19 0827  BP:  105/60 140/82 (!) 141/77  Pulse:  73 78 (!) 55  Resp:  16 16 19   Temp:  (!) 97.5 F (36.4 C) (!) 97.5 F (36.4 C) 98.3 F (36.8 C)  TempSrc:  Oral Oral   SpO2: 95% 94% 93% 100%   General appearance: Awake alert.  In no distress Resp: Normal effort at rest.  Continues  to have scattered wheezes bilaterally.  No definite crackles. Cardio: S1-S2 is normal regular.  No S3-S4.  No rubs murmurs or bruit GI: Abdomen is soft.  Nontender nondistended.  Bowel sounds are present normal.  No masses organomegaly    DISPOSITION: Home  Discharge Instructions    Ambulatory referral to Pulmonology   Complete by: As directed    Previously seen. Lost to f/u. Please make appt in 3-4 weeks. thanks   Reason for referral: Asthma/COPD   Call MD for:  difficulty breathing, headache or visual disturbances   Complete by: As directed    Call MD for:  extreme fatigue   Complete by: As directed    Call MD for:  persistant dizziness or light-headedness   Complete by: As directed    Call MD for:  persistant nausea and vomiting   Complete by: As directed    Call MD for:  severe uncontrolled pain   Complete by: As directed    Call MD for:  temperature >100.4   Complete by: As directed    Discharge instructions   Complete by: As directed    Please take your medications as prescribed including the inhalers that you are supposed to be on at home.  Please follow-up with your primary care provider in 1 week.  A referral has been sent to lung doctor as well.  You were cared for by a hospitalist during your hospital stay. If you have any questions about your discharge medications or the care you received while you were in the hospital after you are discharged, you can call the unit and asked to speak with the hospitalist on call if the hospitalist that took care of you is not available. Once you are discharged, your primary care physician will handle any further medical issues. Please note that NO REFILLS for any discharge medications will be authorized once you are discharged, as it is imperative that you return to your primary care physician (or establish a relationship with a primary care physician if you do not have one) for your aftercare needs so that they can reassess your need for  medications and monitor your lab values. If you do not have a primary care physician, you can call [phone] for a physician referral.   Increase activity slowly   Complete by: As directed        Patient apparently not taking the atorvastatin and donepezil.  Allergies as of 08/12/2019      Reactions   Prednisone Rash, Other (See Comments)   Arrhythmia, also (Has tolerated hydrocortisone and decadron)      Medication List    STOP taking these medications   atorvastatin 40 MG tablet Commonly known as: LIPITOR   donepezil 5 MG tablet Commonly known as: Aricept     TAKE these medications   albuterol 108 (90 Base) MCG/ACT inhaler Commonly known as: VENTOLIN HFA Inhale 1-2 puffs into the lungs every 6 (six) hours as needed for wheezing or shortness of breath.   dexamethasone 4 MG tablet Commonly known as: DECADRON Take 1 tablet twice daily for 4 days, then 1 tablet once daily for 4 days, then STOP.   doxycycline 100 MG tablet Commonly known as: VIBRA-TABS Take 1 tablet (100 mg total) by mouth every 12 (twelve) hours for 4 days.   ezetimibe 10 MG tablet Commonly known as: Zetia Take 1 tablet (10 mg total) by mouth daily.   hydrochlorothiazide 12.5 MG capsule Commonly known as: MICROZIDE Take 1 capsule (12.5 mg total) by mouth daily.   ibuprofen 200 MG tablet Commonly known as: ADVIL Take 200 mg by mouth every 6 (six) hours as needed for fever or moderate pain.   ipratropium-albuterol 0.5-2.5 (3) MG/3ML Soln Commonly known as: DUONEB Take 3 mLs by nebulization every 6 (six) hours as needed (for wheezing/shortness of breath).   lisinopril 10 MG tablet Commonly known as: ZESTRIL Take 1 tablet (10 mg total) by mouth daily. Not reduced dose 10 mg so take 1 pill not 1/2 pill   Trelegy Ellipta 100-62.5-25 MCG/INH Aepb Generic drug: Fluticasone-Umeclidin-Vilant Inhale 1 puff into the lungs daily. Rinse mouth         Follow-up Information    McLean-Scocuzza, Pasty Spillers,  MD. Schedule an appointment as soon as possible for a visit in 1 week(s).   Specialty: Internal Medicine Contact information: [address] [place] [address] [phone]               TOTAL DISCHARGE TIME: 35 minutes  Evely Gainey Rito Ehrlich  [facility] Pager on [email or web address]  08/12/2019, 9:27 AM

## 2019-08-12 NOTE — Discharge Instructions (Signed)
Chronic Obstructive Pulmonary Disease Chronic obstructive pulmonary disease (COPD) is a long-term (chronic) lung problem. When you have COPD, it is hard for air to get in and out of your lungs. Usually the condition gets worse over time, and your lungs will never return to normal. There are things you can do to keep yourself as healthy as possible.  Your doctor may treat your condition with: ? Medicines. ? Oxygen. ? Lung surgery.  Your doctor may also recommend: ? Rehabilitation. This includes steps to make your body work better. It may involve a team of specialists. ? Quitting smoking, if you smoke. ? Exercise and changes to your diet. ? Comfort measures (palliative care). Follow these instructions at home: Medicines  Take over-the-counter and prescription medicines only as told by your doctor.  Talk to your doctor before taking any cough or allergy medicines. You may need to avoid medicines that cause your lungs to be dry. Lifestyle  If you smoke, stop. Smoking makes the problem worse. If you need help quitting, ask your doctor.  Avoid being around things that make your breathing worse. This may include smoke, chemicals, and fumes.  Stay active, but remember to rest as well.  Learn and use tips on how to relax.  Make sure you get enough sleep. Most adults need at least 7 hours of sleep every night.  Eat healthy foods. Eat smaller meals more often. Rest before meals. Controlled breathing Learn and use tips on how to control your breathing as told by your doctor. Try:  Breathing in (inhaling) through your nose for 1 second. Then, pucker your lips and breath out (exhale) through your lips for 2 seconds.  Putting one hand on your belly (abdomen). Breathe in slowly through your nose for 1 second. Your hand on your belly should move out. Pucker your lips and breathe out slowly through your lips. Your hand on your belly should move in as you breathe out.  Controlled coughing Learn  and use controlled coughing to clear mucus from your lungs. Follow these steps: 1. Lean your head a little forward. 2. Breathe in deeply. 3. Try to hold your breath for 3 seconds. 4. Keep your mouth slightly open while coughing 2 times. 5. Spit any mucus out into a tissue. 6. Rest and do the steps again 1 or 2 times as needed. General instructions  Make sure you get all the shots (vaccines) that your doctor recommends. Ask your doctor about a flu shot and a pneumonia shot.  Use oxygen therapy and pulmonary rehabilitation if told by your doctor. If you need home oxygen therapy, ask your doctor if you should buy a tool to measure your oxygen level (oximeter).  Make a COPD action plan with your doctor. This helps you to know what to do if you feel worse than usual.  Manage any other conditions you have as told by your doctor.  Avoid going outside when it is very hot, cold, or humid.  Avoid people who have a sickness you can catch (contagious).  Keep all follow-up visits as told by your doctor. This is important. Contact a doctor if:  You cough up more mucus than usual.  There is a change in the color or thickness of the mucus.  It is harder to breathe than usual.  Your breathing is faster than usual.  You have trouble sleeping.  You need to use your medicines more often than usual.  You have trouble doing your normal activities such as getting dressed   or walking around the house. Get help right away if:  You have shortness of breath while resting.  You have shortness of breath that stops you from: ? Being able to talk. ? Doing normal activities.  Your chest hurts for longer than 5 minutes.  Your skin color is more blue than usual.  Your pulse oximeter shows that you have low oxygen for longer than 5 minutes.  You have a fever.  You feel too tired to breathe normally. Summary  Chronic obstructive pulmonary disease (COPD) is a long-term lung problem.  The way your  lungs work will never return to normal. Usually the condition gets worse over time. There are things you can do to keep yourself as healthy as possible.  Take over-the-counter and prescription medicines only as told by your doctor.  If you smoke, stop. Smoking makes the problem worse. This information is not intended to replace advice given to you by your health care provider. Make sure you discuss any questions you have with your health care provider. Document Revised: 12/31/2016 Document Reviewed: 02/23/2016 Elsevier Patient Education  2020 Elsevier Inc.  

## 2019-08-13 ENCOUNTER — Telehealth: Payer: Self-pay

## 2019-08-13 NOTE — Telephone Encounter (Signed)
Noted  Dr. Keondrick Dilks McLean-Scocuzza  

## 2019-08-13 NOTE — Telephone Encounter (Signed)
Transition Care Management Follow-up Telephone Call  Date of discharge and from where: 08/12/19 from [facility]  How have you been since you were released from the hospital? "I am just a little tired with pretty much baseline breathing, it's not enough for me to put on oxygen. I only wear it as needed and I am lying down now."  Denies extreme fatigue, headache, chest pain, abd pain, fever, chills, nausea, vomiting, diarrhea and all other symptoms. Agrees to continue using nebulizer as directed.   Any questions or concerns? No . Encouraged to call the office back if symptoms worsen and as needed.   Items Reviewed:  Did the pt receive and understand the discharge instructions provided? Yes , increase activity slowly.   Medications obtained and verified? Yes   Any new allergies since your discharge? No   Dietary orders reviewed? Yes  Do you have support at home? Yes , lives with daughter  Functional Questionnaire: (I = Independent and D = Dependent) ADLs: i  Bathing/Dressing- i  Meal Prep- daughter assist  Eating- i  Maintaining continence- i  Transferring/Ambulation- walker as needed  Managing Meds- daughter manages  Follow up appointments reviewed:   PCP Hospital f/u appt confirmed? Yes  Scheduled to see Dr.Tracy Mclean Scocuzza on 08/17/19 @ 10:00.  Specialist Hospital f/u appt confirmed? Discharge notes referral was sent to pulmonology. Patient is awaiting call back for scheduling.    Are transportation arrangements needed? No   If their condition worsens, is the pt aware to call PCP or go to the Emergency Dept.? Yes  Was the patient provided with contact information for the PCP's office or ED? Yes  Was to pt encouraged to call back with questions or concerns? Yes

## 2019-08-17 ENCOUNTER — Encounter: Payer: Self-pay | Admitting: Internal Medicine

## 2019-08-17 ENCOUNTER — Telehealth (INDEPENDENT_AMBULATORY_CARE_PROVIDER_SITE_OTHER): Payer: Medicare Other | Admitting: Internal Medicine

## 2019-08-17 VITALS — Ht 60.0 in | Wt 225.0 lb

## 2019-08-17 DIAGNOSIS — Z13818 Encounter for screening for other digestive system disorders: Secondary | ICD-10-CM

## 2019-08-17 DIAGNOSIS — J441 Chronic obstructive pulmonary disease with (acute) exacerbation: Secondary | ICD-10-CM | POA: Diagnosis not present

## 2019-08-17 DIAGNOSIS — Z1329 Encounter for screening for other suspected endocrine disorder: Secondary | ICD-10-CM

## 2019-08-17 DIAGNOSIS — I10 Essential (primary) hypertension: Secondary | ICD-10-CM

## 2019-08-17 DIAGNOSIS — E559 Vitamin D deficiency, unspecified: Secondary | ICD-10-CM

## 2019-08-17 DIAGNOSIS — E785 Hyperlipidemia, unspecified: Secondary | ICD-10-CM

## 2019-08-17 DIAGNOSIS — R739 Hyperglycemia, unspecified: Secondary | ICD-10-CM

## 2019-08-17 NOTE — Progress Notes (Signed)
Virtual Visit via Video Note  I connected with Lauren Koch Patient  on 08/17/19 at 10:30 AM EDT by a video enabled telemedicine application and verified that I am speaking with the correct person using two identifiers.  Location patient: home Location provider:work or home office Persons participating in the virtual visit: patient, provider, grand daughter [name]  I discussed the limitations of evaluation and management by telemedicine and the availability of in person appointments. The patient expressed understanding and agreed to proceed.   HPI: Hospital f/u [facility] 7/9-7/11/21 copd exacerbation known copd on 2L O2 prn now on dexamethasone and doxycycline and doing better still with chronic cough w/o phelgm on prn Albuterol, trelegy off O2 for now and no sob with exertion CXR 08/10/19 negative  Denies cig use or 2nd hand exposure doing better since hospital d/c and grandaughter thinks pt at baseline   ROS: See pertinent positives and negatives per HPI.  Past Medical History:  Diagnosis Date  . Asthma   . Cataract   . COPD (chronic obstructive pulmonary disease) (HCC)   . Cryptogenic Stroke (HCC)    a. 03/2016 L ACA, L MCX, L PCA, and R dorsal pontine territory infarcts; b. 03/2016 Carotid U/S: 1-39% bil dzs; c. 03/2016 TEE: EF 60-65%, no rwma, no LA/LAA/RA/RAA thrombus. No PFO; d. 03/2016 s/p MDT Reveal Linq (ser# [id number]) - no AFib noted to date (some sinus w/ freq PACs).  . Dementia (HCC)   . Diastolic dysfunction    a. 03/2016 Echo: EF 60-65%, gr1 DD, nl RV fxn.  Marland Kitchen GERD (gastroesophageal reflux disease)   . Hypertension   . Smoker   . Vitamin D deficiency     Past Surgical History:  Procedure Laterality Date  . ABDOMINAL HYSTERECTOMY     partial   . CESAREAN SECTION  1971/1976  . HERNIA REPAIR  2013  . LOOP RECORDER INSERTION N/A 03/23/2016   Procedure: Loop Recorder Insertion;  [name], MD;  [name];  Service: Cardiovascular;  [name];  .  PARTIAL HYMENECTOMY    . TEE WITHOUT CARDIOVERSION N/A 03/23/2016   Procedure: TRANSESOPHAGEAL ECHOCARDIOGRAM (TEE);  [name], MD;  Location: [facility] ENDOSCOPY;  Service: Cardiovascular;  [name];    Family History  Problem Relation Age of Onset  . Diabetes Mother   . Hypertension Mother   . Miscarriages / [place] Mother   . Hyperlipidemia Mother   . Heart disease Father   . Diabetes Father   . Hypertension Father   . Hyperlipidemia Father   . Stroke Father   . Cancer Sister        skin  . Diabetes Other   . Hyperlipidemia Daughter     SOCIAL HX: lives at home with granddaughter/daughter   Current Outpatient Medications:  .  albuterol ([name], Inhale 1-2 puffs into the lungs every 6 (six) hours as needed for wheezing or shortness of breath., [name] 54 g, Rfl: 3 .  dexamethasone (DECADRON) 4 MG tablet, Take 1 tablet twice daily for 4 days, then 1 tablet once daily for 4 days, then STOP., [name] 12 tablet, Rfl: 0 .  Fluticasone-Umeclidin-Vilant (TRELEGY ELLIPTA) 100-62.5-25 MCG/INH AEPB, Inhale 1 puff into the lungs daily. Rinse mouth, [name] 180 each, Rfl: 4 .  hydrochlorothiazide (MICROZIDE) 12.5 MG capsule, Take 1 capsule (12.5 mg total) by mouth daily., [name] 90 capsule, Rfl: 0 .  ibuprofen (ADVIL) 200 MG tablet, Take 200 mg by mouth every 6 (six)  hours as needed for fever or moderate pain., [name] , Rfl:  .  ipratropium-albuterol (DUONEB) 0.5-2.5 (3) MG/3ML SOLN, Take 3 mLs by nebulization every 6 (six) hours as needed (for wheezing/shortness of breath)., [name] 1080 mL, Rfl: 4 .  lisinopril (ZESTRIL) 10 MG tablet, Take 1 tablet (10 mg total) by mouth daily. Not reduced dose 10 mg so take 1 pill not 1/2 pill, [name], Rfl: 0 .  ezetimibe (ZETIA) 10 MG tablet, Take 1 tablet (10 mg total) by mouth daily. (Patient not taking: Reported on 08/17/2019), [name], Rfl: 0  EXAM:  VITALS per patient if  applicable:  GENERAL: alert, oriented, appears well and in no acute distress  HEENT: atraumatic, conjunttiva clear, no obvious abnormalities on inspection of external nose and ears  NECK: normal movements of the head and neck  LUNGS: on inspection no signs of respiratory distress, breathing rate appears normal, no obvious gross SOB, gasping or wheezing  CV: no obvious cyanosis  MS: moves all visible extremities without noticeable abnormality  PSYCH/NEURO: pleasant and cooperative, no obvious depression or anxiety, speech and thought processing grossly intact  ASSESSMENT AND PLAN:  Discussed the following assessment and plan:  COPD exacerbation (HCC) - Plan: Ambulatory referral to Pulmonology [facility]  Doing better  Cont inhalers  Complete dexamethasone and doxycycline course  Prn O2 2L    -we discussed possible serious and likely etiologies, options for evaluation and workup, limitations of telemedicine visit vs in person visit, treatment, treatment risks and precautions. Pt prefers to treat via telemedicine empirically rather then risking or undertaking an in person visit at this moment. Patient agrees to seek prompt in person care if worsening, new symptoms arise, or if is not improving with treatment.   I discussed the assessment and treatment plan with the patient. The patient was provided an opportunity to ask questions and all were answered. The patient agreed with the plan and demonstrated an understanding of the instructions.   The patient was advised to call back or seek an in-person evaluation if the symptoms worsen or if the condition fails to improve as anticipated.   [name], MD

## 2019-09-25 ENCOUNTER — Ambulatory Visit: Payer: Medicare Other | Admitting: Pulmonary Disease

## 2019-10-31 ENCOUNTER — Ambulatory Visit: Payer: Medicare Other

## 2019-10-31 ENCOUNTER — Telehealth: Payer: Self-pay

## 2019-10-31 NOTE — Telephone Encounter (Signed)
Failed attempt to reach patient for scheduled awv. No answer. No vm. Reschedule as appropriate.

## 2019-11-03 ENCOUNTER — Other Ambulatory Visit: Payer: Self-pay

## 2019-11-03 ENCOUNTER — Emergency Department: Payer: Medicare Other

## 2019-11-03 ENCOUNTER — Emergency Department
Admission: EM | Admit: 2019-11-03 | Discharge: 2019-11-03 | Disposition: A | Discharge status: Home / Self Care | Payer: Medicare Other | Attending: Emergency Medicine | Admitting: Emergency Medicine

## 2019-11-03 DIAGNOSIS — R062 Wheezing: Secondary | ICD-10-CM | POA: Diagnosis not present

## 2019-11-03 DIAGNOSIS — J449 Chronic obstructive pulmonary disease, unspecified: Secondary | ICD-10-CM | POA: Insufficient documentation

## 2019-11-03 DIAGNOSIS — Z5321 Procedure and treatment not carried out due to patient leaving prior to being seen by health care provider: Secondary | ICD-10-CM | POA: Insufficient documentation

## 2019-11-03 DIAGNOSIS — Z20822 Contact with and (suspected) exposure to covid-19: Secondary | ICD-10-CM | POA: Insufficient documentation

## 2019-11-03 DIAGNOSIS — R0602 Shortness of breath: Secondary | ICD-10-CM | POA: Insufficient documentation

## 2019-11-03 LAB — CBC WITH DIFFERENTIAL/PLATELET
Abs Immature Granulocytes: 0.03 10*3/uL (ref 0.00–0.07)
Basophils Absolute: 0.1 10*3/uL (ref 0.0–0.1)
Basophils Relative: 1 %
Eosinophils Absolute: 0.2 10*3/uL (ref 0.0–0.5)
Eosinophils Relative: 3 %
HCT: 42 % (ref 36.0–46.0)
Hemoglobin: 14.3 g/dL (ref 12.0–15.0)
Immature Granulocytes: 0 %
Lymphocytes Relative: 10 %
Lymphs Abs: 0.8 10*3/uL (ref 0.7–4.0)
MCH: 28.3 pg (ref 26.0–34.0)
MCHC: 34 g/dL (ref 30.0–36.0)
MCV: 83 fL (ref 80.0–100.0)
Monocytes Absolute: 0.8 10*3/uL (ref 0.1–1.0)
Monocytes Relative: 10 %
Neutro Abs: 6 10*3/uL (ref 1.7–7.7)
Neutrophils Relative %: 76 %
Platelets: 295 10*3/uL (ref 150–400)
RBC: 5.06 MIL/uL (ref 3.87–5.11)
RDW: 14.6 % (ref 11.5–15.5)
WBC: 7.9 10*3/uL (ref 4.0–10.5)
nRBC: 0 % (ref 0.0–0.2)

## 2019-11-03 LAB — COMPREHENSIVE METABOLIC PANEL
ALT: 17 U/L (ref 0–44)
AST: 23 U/L (ref 15–41)
Albumin: 3.9 g/dL (ref 3.5–5.0)
Alkaline Phosphatase: 93 U/L (ref 38–126)
Anion gap: 10 (ref 5–15)
BUN: 13 mg/dL (ref 8–23)
CO2: 24 mmol/L (ref 22–32)
Calcium: 9.1 mg/dL (ref 8.9–10.3)
Chloride: 102 mmol/L (ref 98–111)
Creatinine, Ser: 0.99 mg/dL (ref 0.44–1.00)
GFR calc Af Amer: >60 mL/min (ref >60)
GFR calc non Af Amer: 59 mL/min — ABNORMAL LOW (ref >60)
Glucose, Bld: 132 mg/dL — ABNORMAL HIGH (ref 70–99)
Potassium: 3.8 mmol/L (ref 3.5–5.1)
Sodium: 136 mmol/L (ref 135–145)
Total Bilirubin: 0.6 mg/dL (ref 0.3–1.2)
Total Protein: 7.7 g/dL (ref 6.5–8.1)

## 2019-11-03 LAB — TROPONIN I (HIGH SENSITIVITY): Troponin I (High Sensitivity): 9 ng/L (ref <18)

## 2019-11-03 LAB — RESPIRATORY PANEL BY RT PCR (FLU A&B, COVID)
Influenza A by PCR: NEGATIVE
Influenza B by PCR: NEGATIVE
SARS Coronavirus 2 by RT PCR: NEGATIVE

## 2019-11-03 MED ORDER — IPRATROPIUM-ALBUTEROL 0.5-2.5 (3) MG/3ML IN SOLN
3.0000 mL | Freq: Once | RESPIRATORY_TRACT | Status: AC
  Administered 2019-11-03: 3 mL via RESPIRATORY_TRACT
  Filled 2019-11-03: qty 3

## 2019-11-03 NOTE — ED Triage Notes (Signed)
Patient reports increased shortness of breath that started this evening.  Patient with history of asthma and copd.  Patient with audible wheezing heard.

## 2019-11-04 LAB — TROPONIN I (HIGH SENSITIVITY): Troponin I (High Sensitivity): 3 ng/L (ref <18)

## 2019-11-08 ENCOUNTER — Telehealth: Payer: Self-pay | Admitting: Internal Medicine

## 2019-11-08 NOTE — Telephone Encounter (Signed)
Rejection Reason - Patient did not respond - 3rd attempt to contact pt. Closing referral due to protocol." [facility] said on Nov 08, 2019 10:13 AM  "No show- 09/25/19"  Pulmonary at [facility] said on Oct 23, 2019 11:39 AM

## 2019-11-08 NOTE — Telephone Encounter (Signed)
I spoke with pt about the ofc trying reach her and missed appt on 08-Oct-2019. Pt stated she had death in her family. I advised that she call and sch also Dr Shirlee Latch would like for pt to go to pulmonology. Pt states her daughter makes appts and will have her daughter call to sch. I also sent message to staff regarding conversation.

## 2019-11-08 NOTE — Telephone Encounter (Signed)
She needs referral

## 2019-12-06 ENCOUNTER — Institutional Professional Consult (permissible substitution): Payer: Medicare Other | Admitting: Pulmonary Disease

## 2020-01-02 ENCOUNTER — Telehealth: Payer: Self-pay | Admitting: Internal Medicine

## 2020-01-02 NOTE — Telephone Encounter (Signed)
Left message for patient to call back and schedule Medicare Annual Wellness Visit (AWV)   This should be a telephone visit only=30 minutes.  No hx of AWV; please schedule at anytime with Denisa O'Brien-Blaney at [facility]  AWV-I PER [place] AS OF 03/04/17

## 2020-03-31 ENCOUNTER — Telehealth: Payer: Self-pay

## 2020-03-31 ENCOUNTER — Ambulatory Visit: Payer: Medicare PPO

## 2020-03-31 NOTE — Telephone Encounter (Signed)
No answer when called for scheduled AWV on preferred number. Left message to call the ofc back and reschedule.

## 2020-04-11 ENCOUNTER — Ambulatory Visit: Payer: Medicare PPO

## 2020-04-11 ENCOUNTER — Telehealth: Payer: Self-pay

## 2020-04-11 NOTE — Telephone Encounter (Signed)
Unable to reach patient for scheduled awv. No answer. Left message to call the office back and reschedule.  

## 2020-06-25 IMAGING — CR DG CHEST 1V PORT
1 series · 1 of 1 positions shown · non-contrast
Comparison: 06/23/2017, CT 04/12/2017

CLINICAL DATA: Shortness of breath

EXAM:
PORTABLE CHEST 1 VIEW

[portable]
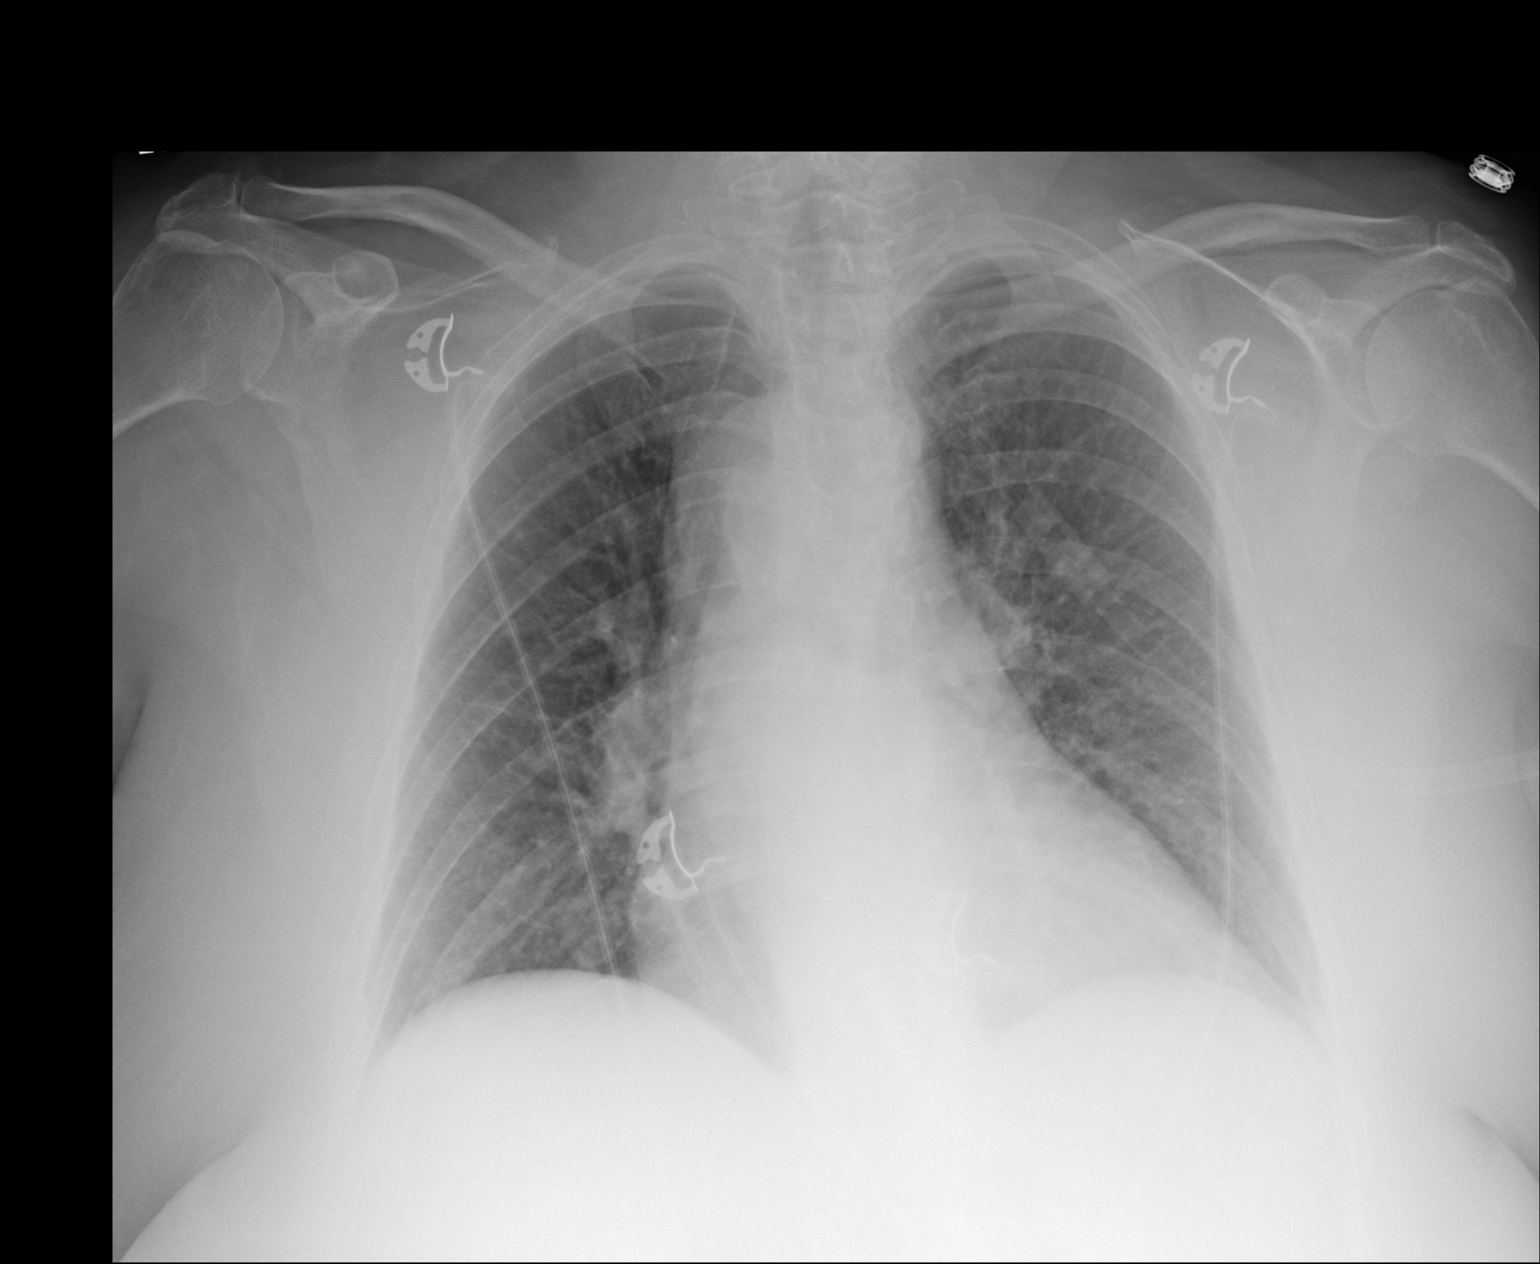

[1 of 1 positions shown; findings below may reference images not displayed]

FINDINGS: Mild cardiomegaly with right-sided aortic arch. No focal opacity or
pleural effusion. No pneumothorax.
IMPRESSION: No active disease.  Cardiomegaly with right-sided aortic arch

## 2020-12-31 ENCOUNTER — Emergency Department (HOSPITAL_COMMUNITY)
Admission: EM | Admit: 2020-12-31 | Discharge: 2020-12-31 | Disposition: A | Discharge status: Home / Self Care | Payer: Medicare PPO | Attending: Emergency Medicine | Admitting: Emergency Medicine

## 2020-12-31 ENCOUNTER — Encounter (HOSPITAL_COMMUNITY): Payer: Self-pay | Admitting: *Deleted

## 2020-12-31 ENCOUNTER — Other Ambulatory Visit: Payer: Self-pay

## 2020-12-31 DIAGNOSIS — J449 Chronic obstructive pulmonary disease, unspecified: Secondary | ICD-10-CM | POA: Insufficient documentation

## 2020-12-31 DIAGNOSIS — Z7951 Long term (current) use of inhaled steroids: Secondary | ICD-10-CM | POA: Diagnosis not present

## 2020-12-31 DIAGNOSIS — J45909 Unspecified asthma, uncomplicated: Secondary | ICD-10-CM | POA: Diagnosis not present

## 2020-12-31 DIAGNOSIS — F0390 Unspecified dementia without behavioral disturbance: Secondary | ICD-10-CM | POA: Diagnosis not present

## 2020-12-31 DIAGNOSIS — H109 Unspecified conjunctivitis: Secondary | ICD-10-CM | POA: Insufficient documentation

## 2020-12-31 DIAGNOSIS — I10 Essential (primary) hypertension: Secondary | ICD-10-CM | POA: Insufficient documentation

## 2020-12-31 DIAGNOSIS — Z79899 Other long term (current) drug therapy: Secondary | ICD-10-CM | POA: Diagnosis not present

## 2020-12-31 DIAGNOSIS — H5712 Ocular pain, left eye: Secondary | ICD-10-CM | POA: Diagnosis present

## 2020-12-31 DIAGNOSIS — Z87891 Personal history of nicotine dependence: Secondary | ICD-10-CM | POA: Diagnosis not present

## 2020-12-31 MED ORDER — FLUORESCEIN SODIUM 1 MG OP STRP
1.0000 | ORAL_STRIP | Freq: Once | OPHTHALMIC | Status: AC
  Administered 2020-12-31: 1 via OPHTHALMIC
  Filled 2020-12-31: qty 1

## 2020-12-31 MED ORDER — TETRACAINE HCL 0.5 % OP SOLN
2.0000 [drp] | Freq: Once | OPHTHALMIC | Status: AC
  Administered 2020-12-31: 2 [drp] via OPHTHALMIC
  Filled 2020-12-31: qty 4

## 2020-12-31 MED ORDER — ERYTHROMYCIN 5 MG/GM OP OINT
TOPICAL_OINTMENT | Freq: Once | OPHTHALMIC | Status: AC
  Filled 2020-12-31: qty 3.5

## 2020-12-31 MED ORDER — ERYTHROMYCIN 5 MG/GM OP OINT: TOPICAL_OINTMENT | OPHTHALMIC | 0 refills | Status: DC

## 2020-12-31 NOTE — Discharge Instructions (Addendum)
Please call ophthalmology tomorrow if your symptoms are not improved.  Eye ointment 3 times a day.  Avoid rubbing eye.

## 2020-12-31 NOTE — ED Provider Notes (Signed)
[facility] EMERGENCY DEPARTMENT Provider Note   CSN: [id number] Arrival date & time: 12/31/20  1911     History Chief Complaint  Patient presents with   Eye Pain    Lauren Koch is a 70 y.o. female.  She is here with a complaint of left eye redness pain and itchiness that started yesterday.  Patient denies any trauma.  Has tried nothing for it.  She does not feel her vision is changed and does not have a headache.  No recent illness, no nausea or vomiting diarrhea blurry vision double vision.  Daughter states this happened once last year and she needed some eye antibiotic ointment.  The history is provided by the patient.  Eye Pain This is a new problem. The current episode started yesterday. The problem occurs constantly. The problem has not changed since onset.Pertinent negatives include no chest pain, no abdominal pain, no headaches and no shortness of breath. Nothing aggravates the symptoms. Nothing relieves the symptoms. She has tried nothing for the symptoms. The treatment provided no relief.      Past Medical History:  Diagnosis Date   Asthma    Cataract    COPD (chronic obstructive pulmonary disease) ([place])    Cryptogenic Stroke ([place])    a. 03/2016 L ACA, L MCX, L PCA, and R dorsal pontine territory infarcts; b. 03/2016 Carotid U/S: 1-39% bil dzs; c. 03/2016 TEE: EF 60-65%, no rwma, no LA/LAA/RA/RAA thrombus. No PFO; d. 03/2016 s/p MDT Reveal Linq (ser# [id number]) - no AFib noted to date (some sinus w/ freq PACs).   Dementia ([place])    Diastolic dysfunction    a. 03/2016 Echo: EF 60-65%, gr1 DD, nl RV fxn.   GERD (gastroesophageal reflux disease)    Hypertension    Smoker    Vitamin D deficiency     Patient Active Problem List   Diagnosis Date Noted   Upper respiratory tract infection 08/10/2019   COPD (chronic obstructive pulmonary disease) with acute bronchitis ([place]) 04/03/2018   Physical deconditioning 12/21/2017   Urinary incontinence 12/21/2017   Acute on chronic  respiratory failure with hypoxia ([place]) 11/26/2017   Dyspnea 11/25/2017   Tachycardia 11/25/2017   Syncope 06/24/2017   Memory loss 06/10/2017   Abnormal thyroid function test 06/10/2017   Breast cyst, left 06/10/2017   Hyperlipemia 03/25/2016   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 03/20/2016   Osteopenia 08/08/2015   Pulmonary emphysema ([facility])    Abnormal TSH 05/30/2015   Acute respiratory failure with hypoxia ([place]) 05/29/2015   COPD with acute exacerbation ([place]) 05/29/2015   Acute kidney injury ([place]) 05/29/2015   Morbid obesity ([place]) 05/29/2015   Tobacco abuse 05/29/2015   Right leg pain 05/29/2015   Essential hypertension 05/29/2015   Vitamin D deficiency 04/17/2015   Smoker 03/03/2015   Asthma    Cataract    COPD (chronic obstructive pulmonary disease) ([place])    GERD (gastroesophageal reflux disease)     Past Surgical History:  Procedure Laterality Date   ABDOMINAL HYSTERECTOMY     partial    CESAREAN SECTION  1971/1976   HERNIA REPAIR  2013   LOOP RECORDER INSERTION N/A 03/23/2016   Procedure: Loop Recorder Insertion;  Surgeon: Evans Lance, MD;  Location: [facility];  Service: Cardiovascular;  [name];   PARTIAL HYMENECTOMY     TEE WITHOUT CARDIOVERSION N/A 03/23/2016   Procedure: TRANSESOPHAGEAL ECHOCARDIOGRAM (TEE);  Surgeon: Skeet Latch, MD;  Location: [facility]  ENDOSCOPY;  Service: Cardiovascular;  [name];     OB History     Gravida  2   Para  2   Term  2   Preterm      AB      Living  2      SAB      IAB      Ectopic      Multiple      Live Births              Family History  Problem Relation Age of Onset   Diabetes Mother    Hypertension Mother    Miscarriages / [place] Mother    Hyperlipidemia Mother    Heart disease Father    Diabetes Father    Hypertension Father    Hyperlipidemia Father    Stroke Father    Cancer Sister        skin   Diabetes Other     Hyperlipidemia Daughter     Social History   Tobacco Use   Smoking status: Former    Packs/day: 1.00    Years: 48.00    Pack years: 48.00    Types: Cigarettes    Quit date: 12/02/2017    Years since quitting: 3.0   Smokeless tobacco: Former    Quit date: 05/29/2015  Vaping Use   Vaping Use: Never used  Substance Use Topics   Alcohol use: Not Currently    Alcohol/week: 0.0 standard drinks   Drug use: No    Home Medications Prior to Admission medications   Medication Sig Start Date End Date Taking? Authorizing Provider  albuterol ([name] Inhale 1-2 puffs into the lungs every 6 (six) hours as needed for wheezing or shortness of breath. 05/11/19   McLean-Scocuzza, Pasty Spillers, MD  dexamethasone (DECADRON) 4 MG tablet Take 1 tablet twice daily for 4 days, then 1 tablet once daily for 4 days, then STOP. 08/12/19   Osvaldo Shipper, MD  ezetimibe (ZETIA) 10 MG tablet Take 1 tablet (10 mg total) by mouth daily. Patient not taking: Reported on 08/17/2019 05/11/19   McLean-Scocuzza, Pasty Spillers, MD  Fluticasone-Umeclidin-Vilant (TRELEGY ELLIPTA) 100-62.5-25 MCG/INH AEPB Inhale 1 puff into the lungs daily. Rinse mouth 05/11/19   McLean-Scocuzza, Pasty Spillers, MD  hydrochlorothiazide (MICROZIDE) 12.5 MG capsule Take 1 capsule (12.5 mg total) by mouth daily. 05/11/19   McLean-Scocuzza, Pasty Spillers, MD  ibuprofen (ADVIL) 200 MG tablet Take 200 mg by mouth every 6 (six) hours as needed for fever or moderate pain.    [provider]  ipratropium-albuterol (DUONEB) 0.5-2.5 (3) MG/3ML SOLN Take 3 mLs by nebulization every 6 (six) hours as needed (for wheezing/shortness of breath). 05/11/19   McLean-Scocuzza, Pasty Spillers, MD  lisinopril (ZESTRIL) 10 MG tablet Take 1 tablet (10 mg total) by mouth daily. Not reduced dose 10 mg so take 1 pill not 1/2 pill 05/11/19   McLean-Scocuzza, Pasty Spillers, MD    Allergies    Prednisone  Review of Systems   Review of Systems  Constitutional:  Negative  for fever.  HENT:  Negative for sore throat.   Eyes:  Positive for pain, redness and itching. Negative for visual disturbance.  Respiratory:  Negative for shortness of breath.   Cardiovascular:  Negative for chest pain.  Gastrointestinal:  Negative for abdominal pain.  Skin:  Negative for rash.  Neurological:  Negative for headaches.   Physical Exam Updated Vital Signs BP (!) 177/97 (BP Location:  Right Arm)   Pulse (!) 59   Temp 98 F (36.7 C) (Oral)   Resp 17   Ht 5\' 5"  (1.651 m)   Wt 88.9 kg   SpO2 (!) 89%   BMI 32.62 kg/m   Physical Exam Constitutional:      Appearance: Normal appearance. She is well-developed.  HENT:     Head: Normocephalic and atraumatic.  Eyes:     General: Lids are everted, no foreign bodies appreciated.     Intraocular pressure: Left eye pressure is 8 mmHg. Measurements were taken using a handheld tonometer.    Extraocular Movements: Extraocular movements intact.     Conjunctiva/sclera:     Right eye: Right conjunctiva is not injected.     Left eye: Left conjunctiva is injected.     Pupils: Pupils are equal, round, and reactive to light.     Slit lamp exam:    Right eye: Anterior chamber quiet.     Left eye: Anterior chamber quiet. No corneal ulcer, foreign body or hyphema.     Comments: Fluoroscene with no uptake  Cardiovascular:     Rate and Rhythm: Normal rate and regular rhythm.  Musculoskeletal:     Cervical back: Neck supple.  Skin:    General: Skin is warm and dry.  Neurological:     General: No focal deficit present.     Mental Status: She is alert.     GCS: GCS eye subscore is 4. GCS verbal subscore is 5. GCS motor subscore is 6.    ED Results / Procedures / Treatments   Labs (all labs ordered are listed, but only abnormal results are displayed) Labs Reviewed - No data to display  EKG None  Radiology No results found.  Procedures Procedures   Medications Ordered in ED Medications  fluorescein ophthalmic strip 1 strip  (has no administration in time range)  tetracaine (PONTOCAINE) 0.5 % ophthalmic solution 2 drop (has no administration in time range)    ED Course  I have reviewed the triage vital signs and the nursing notes.  Pertinent labs & imaging results that were available during my care of the patient were reviewed by me and considered in my medical decision making (see chart for details).    MDM Rules/Calculators/A&P                          70 year old female with atraumatic left eye redness itching discomfort.  Visual acuity preserved.  Intraocular pressure not elevated.  Fluorescein with no clear uptake.  She has conjunctival injection.  Will cover with topical antibiotics and recommend ophthalmology follow-up.  Return instructions discussed  Final Clinical Impression(s) / ED Diagnoses Final diagnoses:  Conjunctivitis of left eye, unspecified conjunctivitis type    Rx / DC Orders ED Discharge Orders          Ordered    erythromycin ophthalmic ointment        12/31/20 2145             Hayden Rasmussen, MD 01/01/21 1045

## 2020-12-31 NOTE — ED Triage Notes (Signed)
Pt c/o pain and redness to left eye; pt denies any drainage

## 2021-01-09 ENCOUNTER — Telehealth: Payer: Self-pay | Admitting: Internal Medicine

## 2021-01-09 NOTE — Telephone Encounter (Signed)
Daughter called in she advise that patient left eye red and have pain went to er and was given ointment antibiotic has not clear up, needing appointment  xfer Access nurse

## 2021-01-12 NOTE — Telephone Encounter (Signed)
Access nurse states the Patient has been using erythromycin eye drops but was also given a second medication at the ED that they have not picked up. Advised by access nurse to pick up and start second medication as well. Informed them the Patient should be seen in 24 hours and suggested they got to urgent care.   Daughter states she would call th Patient back to get her scheduled with our office Monday if the Patient still need evaluation.

## 2021-01-12 NOTE — Telephone Encounter (Signed)
Left message to return call.  Please schedule Patient an appointment with any available provider if calling back in.

## 2022-08-31 ENCOUNTER — Encounter: Payer: Medicare PPO | Admitting: Nurse Practitioner

## 2022-09-29 ENCOUNTER — Other Ambulatory Visit: Payer: Self-pay

## 2022-09-29 ENCOUNTER — Emergency Department: Payer: Medicare PPO

## 2022-09-29 ENCOUNTER — Encounter: Payer: Self-pay | Admitting: *Deleted

## 2022-09-29 ENCOUNTER — Inpatient Hospital Stay
Admission: EM | Admit: 2022-09-29 | Discharge: 2022-10-03 | DRG: 193 | Disposition: A | Discharge status: Home / Self Care | Payer: Medicare PPO | Attending: Internal Medicine | Admitting: Internal Medicine

## 2022-09-29 DIAGNOSIS — Z87891 Personal history of nicotine dependence: Secondary | ICD-10-CM

## 2022-09-29 DIAGNOSIS — Z8673 Personal history of transient ischemic attack (TIA), and cerebral infarction without residual deficits: Secondary | ICD-10-CM | POA: Diagnosis not present

## 2022-09-29 DIAGNOSIS — K219 Gastro-esophageal reflux disease without esophagitis: Secondary | ICD-10-CM | POA: Diagnosis present

## 2022-09-29 DIAGNOSIS — J189 Pneumonia, unspecified organism: Principal | ICD-10-CM | POA: Diagnosis present

## 2022-09-29 DIAGNOSIS — Z83438 Family history of other disorder of lipoprotein metabolism and other lipidemia: Secondary | ICD-10-CM

## 2022-09-29 DIAGNOSIS — Z833 Family history of diabetes mellitus: Secondary | ICD-10-CM

## 2022-09-29 DIAGNOSIS — J9601 Acute respiratory failure with hypoxia: Secondary | ICD-10-CM | POA: Diagnosis present

## 2022-09-29 DIAGNOSIS — F0390 Unspecified dementia without behavioral disturbance: Secondary | ICD-10-CM | POA: Diagnosis present

## 2022-09-29 DIAGNOSIS — J440 Chronic obstructive pulmonary disease with acute lower respiratory infection: Secondary | ICD-10-CM | POA: Diagnosis present

## 2022-09-29 DIAGNOSIS — I10 Essential (primary) hypertension: Secondary | ICD-10-CM | POA: Diagnosis present

## 2022-09-29 DIAGNOSIS — E669 Obesity, unspecified: Secondary | ICD-10-CM | POA: Diagnosis present

## 2022-09-29 DIAGNOSIS — I5032 Chronic diastolic (congestive) heart failure: Secondary | ICD-10-CM | POA: Diagnosis present

## 2022-09-29 DIAGNOSIS — R531 Weakness: Secondary | ICD-10-CM

## 2022-09-29 DIAGNOSIS — Z90711 Acquired absence of uterus with remaining cervical stump: Secondary | ICD-10-CM | POA: Diagnosis not present

## 2022-09-29 DIAGNOSIS — Z823 Family history of stroke: Secondary | ICD-10-CM | POA: Diagnosis not present

## 2022-09-29 DIAGNOSIS — J441 Chronic obstructive pulmonary disease with (acute) exacerbation: Secondary | ICD-10-CM | POA: Diagnosis present

## 2022-09-29 DIAGNOSIS — Z6841 Body Mass Index (BMI) 40.0 and over, adult: Secondary | ICD-10-CM | POA: Diagnosis not present

## 2022-09-29 DIAGNOSIS — Z8249 Family history of ischemic heart disease and other diseases of the circulatory system: Secondary | ICD-10-CM | POA: Diagnosis not present

## 2022-09-29 DIAGNOSIS — I110 Hypertensive heart disease with heart failure: Secondary | ICD-10-CM | POA: Diagnosis present

## 2022-09-29 DIAGNOSIS — J439 Emphysema, unspecified: Secondary | ICD-10-CM

## 2022-09-29 DIAGNOSIS — J45909 Unspecified asthma, uncomplicated: Secondary | ICD-10-CM | POA: Diagnosis present

## 2022-09-29 DIAGNOSIS — Z79899 Other long term (current) drug therapy: Secondary | ICD-10-CM | POA: Diagnosis not present

## 2022-09-29 DIAGNOSIS — R062 Wheezing: Secondary | ICD-10-CM | POA: Diagnosis not present

## 2022-09-29 DIAGNOSIS — Z888 Allergy status to other drugs, medicaments and biological substances status: Secondary | ICD-10-CM | POA: Diagnosis not present

## 2022-09-29 LAB — CBC WITH DIFFERENTIAL/PLATELET
Abs Immature Granulocytes: 0.02 10*3/uL (ref 0.00–0.07)
Basophils Absolute: 0.1 10*3/uL (ref 0.0–0.1)
Basophils Relative: 1 %
Eosinophils Absolute: 0.2 10*3/uL (ref 0.0–0.5)
Eosinophils Relative: 2 %
HCT: 45.3 % (ref 36.0–46.0)
Hemoglobin: 15.3 g/dL — ABNORMAL HIGH (ref 12.0–15.0)
Immature Granulocytes: 0 %
Lymphocytes Relative: 20 %
Lymphs Abs: 1.4 10*3/uL (ref 0.7–4.0)
MCH: 29 pg (ref 26.0–34.0)
MCHC: 33.8 g/dL (ref 30.0–36.0)
MCV: 85.8 fL (ref 80.0–100.0)
Monocytes Absolute: 1 10*3/uL (ref 0.1–1.0)
Monocytes Relative: 14 %
Neutro Abs: 4.5 10*3/uL (ref 1.7–7.7)
Neutrophils Relative %: 63 %
Platelets: 259 10*3/uL (ref 150–400)
RBC: 5.28 MIL/uL — ABNORMAL HIGH (ref 3.87–5.11)
RDW: 13.5 % (ref 11.5–15.5)
WBC: 7.1 10*3/uL (ref 4.0–10.5)
nRBC: 0 % (ref 0.0–0.2)

## 2022-09-29 LAB — BRAIN NATRIURETIC PEPTIDE: B Natriuretic Peptide: 106.1 pg/mL — ABNORMAL HIGH (ref 0.0–100.0)

## 2022-09-29 LAB — TROPONIN I (HIGH SENSITIVITY)
Troponin I (High Sensitivity): 8 ng/L (ref <18)
Troponin I (High Sensitivity): 7 ng/L (ref <18)

## 2022-09-29 LAB — CBC
HCT: 44.6 % (ref 36.0–46.0)
Hemoglobin: 14.7 g/dL (ref 12.0–15.0)
MCH: 28.6 pg (ref 26.0–34.0)
MCHC: 33 g/dL (ref 30.0–36.0)
MCV: 86.8 fL (ref 80.0–100.0)
Platelets: 268 10*3/uL (ref 150–400)
RBC: 5.14 MIL/uL — ABNORMAL HIGH (ref 3.87–5.11)
RDW: 13.7 % (ref 11.5–15.5)
WBC: 9.9 10*3/uL (ref 4.0–10.5)
nRBC: 0 % (ref 0.0–0.2)

## 2022-09-29 LAB — COMPREHENSIVE METABOLIC PANEL
ALT: 15 U/L (ref 0–44)
AST: 21 U/L (ref 15–41)
Albumin: 3.6 g/dL (ref 3.5–5.0)
Alkaline Phosphatase: 95 U/L (ref 38–126)
Anion gap: 8 (ref 5–15)
BUN: 13 mg/dL (ref 8–23)
CO2: 27 mmol/L (ref 22–32)
Calcium: 8.6 mg/dL — ABNORMAL LOW (ref 8.9–10.3)
Chloride: 105 mmol/L (ref 98–111)
Creatinine, Ser: 0.88 mg/dL (ref 0.44–1.00)
GFR, Estimated: >60 mL/min (ref >60)
Glucose, Bld: 117 mg/dL — ABNORMAL HIGH (ref 70–99)
Potassium: 3.8 mmol/L (ref 3.5–5.1)
Sodium: 140 mmol/L (ref 135–145)
Total Bilirubin: 0.5 mg/dL (ref 0.3–1.2)
Total Protein: 7.6 g/dL (ref 6.5–8.1)

## 2022-09-29 LAB — CREATININE, SERUM
Creatinine, Ser: 0.89 mg/dL (ref 0.44–1.00)
GFR, Estimated: >60 mL/min (ref >60)

## 2022-09-29 LAB — D-DIMER, QUANTITATIVE: D-Dimer, Quant: 0.58 ug{FEU}/mL — ABNORMAL HIGH (ref 0.00–0.50)

## 2022-09-29 MED ORDER — HEPARIN SODIUM (PORCINE) 5000 UNIT/ML IJ SOLN
5000.0000 [IU] | Freq: Three times a day (TID) | INTRAMUSCULAR | Status: DC
  Administered 2022-09-29 – 2022-10-01 (×5): 5000 [IU] via SUBCUTANEOUS
  Filled 2022-09-29 (×5): qty 1

## 2022-09-29 MED ORDER — IPRATROPIUM-ALBUTEROL 0.5-2.5 (3) MG/3ML IN SOLN
3.0000 mL | Freq: Once | RESPIRATORY_TRACT | Status: AC
  Administered 2022-09-29: 3 mL via RESPIRATORY_TRACT
  Filled 2022-09-29: qty 3

## 2022-09-29 MED ORDER — SODIUM CHLORIDE 0.9 % IV SOLN
2.0000 g | Freq: Two times a day (BID) | INTRAVENOUS | Status: DC
  Administered 2022-09-29 – 2022-10-03 (×8): 2 g via INTRAVENOUS
  Filled 2022-09-29 (×9): qty 12.5

## 2022-09-29 MED ORDER — PANTOPRAZOLE SODIUM 40 MG IV SOLR
40.0000 mg | Freq: Two times a day (BID) | INTRAVENOUS | Status: DC
  Administered 2022-09-29 – 2022-09-30 (×2): 40 mg via INTRAVENOUS
  Filled 2022-09-29 (×2): qty 10

## 2022-09-29 MED ORDER — METHYLPREDNISOLONE SODIUM SUCC 125 MG IJ SOLR
125.0000 mg | Freq: Once | INTRAMUSCULAR | Status: AC
  Administered 2022-09-29: 125 mg via INTRAVENOUS
  Filled 2022-09-29: qty 2

## 2022-09-29 MED ORDER — HYDRALAZINE HCL 20 MG/ML IJ SOLN
5.0000 mg | Freq: Four times a day (QID) | INTRAMUSCULAR | Status: DC | PRN
  Administered 2022-09-30 – 2022-10-03 (×3): 5 mg via INTRAVENOUS
  Filled 2022-09-29 (×3): qty 1

## 2022-09-29 MED ORDER — SODIUM CHLORIDE 0.9% FLUSH
3.0000 mL | Freq: Two times a day (BID) | INTRAVENOUS | Status: DC
  Administered 2022-09-29 – 2022-10-03 (×8): 3 mL via INTRAVENOUS

## 2022-09-29 MED ORDER — LACTATED RINGERS IV SOLN: Freq: Once | INTRAVENOUS | Status: AC

## 2022-09-29 MED ORDER — METHYLPREDNISOLONE SODIUM SUCC 125 MG IJ SOLR
60.0000 mg | Freq: Two times a day (BID) | INTRAMUSCULAR | Status: AC
  Administered 2022-09-29 – 2022-09-30 (×3): 60 mg via INTRAVENOUS
  Filled 2022-09-29 (×3): qty 2

## 2022-09-29 MED ORDER — ACETAMINOPHEN 650 MG RE SUPP: 650.0000 mg | Freq: Four times a day (QID) | RECTAL | Status: DC | PRN

## 2022-09-29 MED ORDER — ACETAMINOPHEN 325 MG PO TABS: 650.0000 mg | ORAL_TABLET | Freq: Four times a day (QID) | ORAL | Status: DC | PRN

## 2022-09-29 MED ORDER — GUAIFENESIN ER 600 MG PO TB12
600.0000 mg | ORAL_TABLET | Freq: Two times a day (BID) | ORAL | Status: DC | PRN
  Administered 2022-10-02 (×2): 600 mg via ORAL
  Filled 2022-09-29 (×2): qty 1

## 2022-09-29 MED ORDER — HYDROCHLOROTHIAZIDE 12.5 MG PO TABS
12.5000 mg | ORAL_TABLET | Freq: Every day | ORAL | Status: DC
  Administered 2022-09-30 – 2022-10-03 (×4): 12.5 mg via ORAL
  Filled 2022-09-29 (×4): qty 1

## 2022-09-29 MED ORDER — IPRATROPIUM-ALBUTEROL 0.5-2.5 (3) MG/3ML IN SOLN
3.0000 mL | Freq: Four times a day (QID) | RESPIRATORY_TRACT | Status: DC
  Administered 2022-09-29 – 2022-10-01 (×6): 3 mL via RESPIRATORY_TRACT
  Filled 2022-09-29 (×7): qty 3

## 2022-09-29 MED ORDER — ALBUTEROL SULFATE (2.5 MG/3ML) 0.083% IN NEBU
2.5000 mg | INHALATION_SOLUTION | RESPIRATORY_TRACT | Status: DC | PRN
  Administered 2022-10-01 – 2022-10-02 (×4): 2.5 mg via RESPIRATORY_TRACT
  Filled 2022-09-29 (×4): qty 3

## 2022-09-29 NOTE — H&P (Signed)
History and Physical    Patient: Lauren Koch UEA:[id number] DOB: 1950-08-24 DOA: 09/29/2022 DOS: the patient was seen and examined on 09/29/2022 PCP: Pcp, No  Patient coming from: Home   Chief Complaint:  Chief Complaint  Patient presents with   Cough   Wheezing    HPI: Lauren Koch is a 72 y.o. female with medical history significant for hypertension, GERD, stroke, diastolic dysfunction, history of smoking although patient denies any history of smoking to me coming with  wheezing and cough for about 2-3 days patient denies shortness of breath,.  In the emergency room patient is alert awake oriented to herself or date of birth location and her name does not remember the year.  Patient is a limited historian aside HPI as per chart review.  Initially daughter was at bedside and had information to the emergency room doctor about progressively worsened over the heart to the hospital. Patient is alert awake in no distress and O2 sats decreased to 91% when she is coughing and at rest she is at 93% on room air.  On initial presentation patient meets SIRS criteria with heart rate respiratory rate.  CMP shows glucose of 117 normal LFTs normal creatinine function. Troponin is normal at 8, BNP of 106.1.  CBC shows normal white count of 7.1 hemoglobin of 15.3 normal platelets. In the emergency room patient was given Solu-Medrol which she tolerated along with DuoNebs and nebulizer therapy as needed albuterol, and cardiac enzyme checks. Initial EKG shows sinus rhythm at 83 with QTc of 445 QRS of 110 PR 162 no ST-T wave changes. Review of Systems: Review of Systems  Respiratory:  Positive for cough and wheezing. Negative for shortness of breath.   All other systems reviewed and are negative.  Past Medical History:  Diagnosis Date   Acute on chronic respiratory failure with hypoxia (HCC) 11/26/2017   Asthma    Cataract    COPD (chronic obstructive pulmonary disease) (HCC)    Cryptogenic Stroke  (HCC)    a. 03/2016 L ACA, L MCX, L PCA, and R dorsal pontine territory infarcts; b. 03/2016 Carotid U/S: 1-39% bil dzs; c. 03/2016 TEE: EF 60-65%, no rwma, no LA/LAA/RA/RAA thrombus. No PFO; d. 03/2016 s/p MDT Reveal Linq (ser# [id number]) - no AFib noted to date (some sinus w/ freq PACs).   Dementia (HCC)    Diastolic dysfunction    a. 03/2016 Echo: EF 60-65%, gr1 DD, nl RV fxn.   GERD (gastroesophageal reflux disease)    Hypertension    Smoker    Syncope 06/24/2017   Vitamin D deficiency    Past Surgical History:  Procedure Laterality Date   ABDOMINAL HYSTERECTOMY     partial    CESAREAN SECTION  1971/1976   HERNIA REPAIR  2013   LOOP RECORDER INSERTION N/A 03/23/2016   Procedure: Loop Recorder Insertion;  [name], MD;  [name];  Service: Cardiovascular;  [name];   PARTIAL HYMENECTOMY     TEE WITHOUT CARDIOVERSION N/A 03/23/2016   Procedure: TRANSESOPHAGEAL ECHOCARDIOGRAM (TEE);  [name], MD;  Location: [facility] ENDOSCOPY;  Service: Cardiovascular;  [name];   Social History:   reports that she quit smoking about 4 years ago. Her smoking use included cigarettes. She started smoking about 52 years ago. She has a 48 pack-year smoking history. She quit smokeless tobacco use about 7 years ago. She reports that she does not currently use alcohol. She reports that she does not use  drugs.  Allergies  Allergen Reactions   Prednisone Rash and Other (See Comments)    Arrhythmia, also (Has tolerated hydrocortisone and decadron)     Family History  Problem Relation Age of Onset   Diabetes Mother    Hypertension Mother    Miscarriages / [place] Mother    Hyperlipidemia Mother    Heart disease Father    Diabetes Father    Hypertension Father    Hyperlipidemia Father    Stroke Father    Cancer Sister        skin   Diabetes Other    Hyperlipidemia Daughter     Prior to Admission medications   Medication Sig Start  Date End Date Taking? Authorizing Provider  albuterol ([name] Inhale 1-2 puffs into the lungs every 6 (six) hours as needed for wheezing or shortness of breath. 05/11/19   McLean-Scocuzza, Pasty Spillers, MD  dexamethasone (DECADRON) 4 MG tablet Take 1 tablet twice daily for 4 days, then 1 tablet once daily for 4 days, then STOP. Patient not taking: Reported on 12/31/2020 08/12/19   Osvaldo Shipper, MD  erythromycin ophthalmic ointment Place a 1/2 inch ribbon of ointment into the lower eyelid.  3 times a day for 5 days 12/31/20   Terrilee Files, MD  ezetimibe (ZETIA) 10 MG tablet Take 1 tablet (10 mg total) by mouth daily. Patient not taking: Reported on 08/17/2019 05/11/19   McLean-Scocuzza, Pasty Spillers, MD  Fluticasone-Umeclidin-Vilant (TRELEGY ELLIPTA) 100-62.5-25 MCG/INH AEPB Inhale 1 puff into the lungs daily. Rinse mouth 05/11/19   McLean-Scocuzza, Pasty Spillers, MD  hydrochlorothiazide (MICROZIDE) 12.5 MG capsule Take 1 capsule (12.5 mg total) by mouth daily. 05/11/19   McLean-Scocuzza, Pasty Spillers, MD  ibuprofen (ADVIL) 200 MG tablet Take 200 mg by mouth every 6 (six) hours as needed for fever or moderate pain.    [provider]  ipratropium-albuterol (DUONEB) 0.5-2.5 (3) MG/3ML SOLN Take 3 mLs by nebulization every 6 (six) hours as needed (for wheezing/shortness of breath). 05/11/19   McLean-Scocuzza, Pasty Spillers, MD  lisinopril (ZESTRIL) 10 MG tablet Take 1 tablet (10 mg total) by mouth daily. Not reduced dose 10 mg so take 1 pill not 1/2 pill 05/11/19   McLean-Scocuzza, Pasty Spillers, MD     Vitals:   09/29/22 1925 09/29/22 2030 09/29/22 2100 09/29/22 2130  BP: (!) 212/116 (!) 174/69 (!) 164/80 (!) 158/106  Pulse: 91 80 91 98  Resp: (!) 28 19 (!) 29 19  Temp: 98.4 F (36.9 C)     TempSrc: Oral     SpO2: 93% 100% 93% 90%  Weight:      Height:       Physical Exam Vitals and nursing note reviewed.  Constitutional:      General: She is not in acute distress. HENT:      Head: Normocephalic and atraumatic.     Right Ear: Hearing normal.     Left Ear: Hearing normal.     Nose: Nose normal. No nasal deformity.     Mouth/Throat:     Lips: Pink.     Tongue: No lesions.     Pharynx: Oropharynx is clear.  Eyes:     General: Lids are normal.     Extraocular Movements: Extraocular movements intact.  Cardiovascular:     Rate and Rhythm: Regular rhythm. Tachycardia present.     Heart sounds: Normal heart sounds.  Pulmonary:     Effort: Pulmonary effort is normal.  Breath sounds: Examination of the right-middle field reveals wheezing. Examination of the left-middle field reveals wheezing. Examination of the right-lower field reveals wheezing. Examination of the left-lower field reveals wheezing. Wheezing present.  Abdominal:     General: Bowel sounds are normal. There is no distension.     Palpations: Abdomen is soft. There is no mass.     Tenderness: There is no abdominal tenderness.  Musculoskeletal:     Right lower leg: Edema present.     Left lower leg: Edema present.  Skin:    General: Skin is warm.  Neurological:     General: No focal deficit present.     Mental Status: She is alert and oriented to person, place, and time.     Cranial Nerves: Cranial nerves 2-12 are intact.  Psychiatric:        Attention and Perception: Attention normal.        Mood and Affect: Mood normal.        Speech: Speech normal.        Behavior: Behavior normal. Behavior is cooperative.     Labs on Admission: I have personally reviewed following labs and imaging studies  CBC: Recent Labs  Lab 09/29/22 2008  WBC 7.1  NEUTROABS 4.5  HGB 15.3*  HCT 45.3  MCV 85.8  PLT 259   Basic Metabolic Panel: Recent Labs  Lab 09/29/22 2008  NA 140  K 3.8  CL 105  CO2 27  GLUCOSE 117*  BUN 13  CREATININE 0.88  CALCIUM 8.6*   GFR: Estimated Creatinine Clearance: 58.9 mL/min (by C-G formula based on SCr of 0.88 mg/dL). Liver Function Tests: Recent Labs  Lab  09/29/22 2008  AST 21  ALT 15  ALKPHOS 95  BILITOT 0.5  PROT 7.6  ALBUMIN 3.6   No results for input(s): "LIPASE", "AMYLASE" in the last 168 hours. No results for input(s): "AMMONIA" in the last 168 hours. Coagulation Profile: No results for input(s): "INR", "PROTIME" in the last 168 hours. Cardiac Enzymes: No results for input(s): "CKTOTAL", "CKMB", "CKMBINDEX", "TROPONINI" in the last 168 hours. BNP (last 3 results) No results for input(s): "PROBNP" in the last 8760 hours. HbA1C: No results for input(s): "HGBA1C" in the last 72 hours. CBG: No results for input(s): "GLUCAP" in the last 168 hours. Lipid Profile: No results for input(s): "CHOL", "HDL", "LDLCALC", "TRIG", "CHOLHDL", "LDLDIRECT" in the last 72 hours. Thyroid Function Tests: No results for input(s): "TSH", "T4TOTAL", "FREET4", "T3FREE", "THYROIDAB" in the last 72 hours. Anemia Panel: No results for input(s): "VITAMINB12", "FOLATE", "FERRITIN", "TIBC", "IRON", "RETICCTPCT" in the last 72 hours. Urinalysis    Component Value Date/Time   COLORURINE YELLOW 09/02/2018 1935   APPEARANCEUR CLEAR 09/02/2018 1935   APPEARANCEUR Turbid (A) 06/10/2017 1005   LABSPEC 1.016 09/02/2018 1935   PHURINE 5.0 09/02/2018 1935   GLUCOSEU NEGATIVE 09/02/2018 1935   HGBUR NEGATIVE 09/02/2018 1935   BILIRUBINUR NEGATIVE 09/02/2018 1935   BILIRUBINUR Negative 06/10/2017 1005   KETONESUR NEGATIVE 09/02/2018 1935   PROTEINUR NEGATIVE 09/02/2018 1935   NITRITE NEGATIVE 09/02/2018 1935   LEUKOCYTESUR SMALL (A) 09/02/2018 1935   Unresulted Labs (From admission, onward)     Start     Ordered   09/29/22 2153  CBC  (heparin)  Once,   R       Comments: Baseline for heparin therapy IF NOT ALREADY DRAWN.  Notify MD if PLT < 100 K.    09/29/22 2201   09/29/22 2153  Creatinine, serum  (heparin)  Once,   R       Comments: Baseline for heparin therapy IF NOT ALREADY DRAWN.    09/29/22 2201   09/29/22 2152  Hemoglobin A1c  Add-on,   AD         09/29/22 2201   09/29/22 2151  D-dimer, quantitative  ONCE - STAT,   STAT        09/29/22 2201           Medications  hydrochlorothiazide (HYDRODIURIL) tablet 12.5 mg (has no administration in time range)  ceFEPIme (MAXIPIME) 2 g in sodium chloride 0.9 % 100 mL IVPB (2 g Intravenous New Bag/Given 09/29/22 2220)  methylPREDNISolone sodium succinate (SOLU-MEDROL) 125 mg/2 mL injection 60 mg (60 mg Intravenous Given 09/29/22 2217)  ipratropium-albuterol (DUONEB) 0.5-2.5 (3) MG/3ML nebulizer solution 3 mL (3 mLs Nebulization Given 09/29/22 2219)  albuterol (PROVENTIL) (2.5 MG/3ML) 0.083% nebulizer solution 2.5 mg (has no administration in time range)  sodium chloride flush (NS) 0.9 % injection 3 mL (3 mLs Intravenous Given 09/29/22 2219)  acetaminophen (TYLENOL) tablet 650 mg (has no administration in time range)    Or  acetaminophen (TYLENOL) suppository 650 mg (has no administration in time range)  guaiFENesin (MUCINEX) 12 hr tablet 600 mg (has no administration in time range)  hydrALAZINE (APRESOLINE) injection 5 mg (has no administration in time range)  heparin injection 5,000 Units (5,000 Units Subcutaneous Given 09/29/22 2219)  pantoprazole (PROTONIX) injection 40 mg (40 mg Intravenous Given 09/29/22 2216)  ipratropium-albuterol (DUONEB) 0.5-2.5 (3) MG/3ML nebulizer solution 3 mL (3 mLs Nebulization Given 09/29/22 2001)  ipratropium-albuterol (DUONEB) 0.5-2.5 (3) MG/3ML nebulizer solution 3 mL (3 mLs Nebulization Given 09/29/22 2000)  ipratropium-albuterol (DUONEB) 0.5-2.5 (3) MG/3ML nebulizer solution 3 mL (3 mLs Nebulization Given 09/29/22 2000)  methylPREDNISolone sodium succinate (SOLU-MEDROL) 125 mg/2 mL injection 125 mg (125 mg Intravenous Given 09/29/22 2009)  lactated ringers infusion ( Intravenous New Bag/Given 09/29/22 2215)    Radiological Exams on Admission: DG Chest Portable 1 View  Result Date: 09/29/2022 CLINICAL DATA:  Shortness of breath with wheezing EXAM: PORTABLE CHEST  1 VIEW COMPARISON:  Chest x-ray 11/03/2019.  CT angiogram chest 11/26/2017. FINDINGS: The heart size and mediastinal contours are within normal limits. Right-sided aortic arch again noted. Both lungs are clear. The visualized skeletal structures are unremarkable. IMPRESSION: No active disease. Electronically Signed   By: Darliss Cheney M.D.   On: 09/29/2022 20:11    Data Reviewed: Relevant notes from primary care and specialist visits, past discharge summaries as available in EHR, including Care Everywhere. Prior diagnostic testing as pertinent to current admission diagnoses Updated medications and problem lists for reconciliation ED course, including vitals, labs, imaging, treatment and response to treatment Triage notes, nursing and pharmacy notes and ED provider's notes Notable results as noted in HPI  Assessment and Plan: >> Wheezing/asthma exacerbation/COPD exacerbation: Patient coming in with wheezing on presentation has a history of COPD and asthma, unclear tobacco history patient denies tobacco abuse. Patient started on treatment for COPD exacerbation and we will continue that with incentive spirometer albuterol as needed and Solu-Medrol. Suspect patient will need repeat PFTs and follow-up with pulmonary as outpatient.  Will continue with Solu-Medrol and discharged with steroid taper. As needed guaifenesin, and DuoNeb therapy.  >> Essential hypertension: Will continue patient on HCTZ 12.5 hydralazine 5 mg as needed.   >> GERD: IV PPI therapy.  >>C/H diastolic CHF: Last echo in 2019 shows: Study Conclusions   - Left ventricle: The cavity size was normal. Wall  thickness was    increased in a pattern of mild LVH. Systolic function was normal.    The estimated ejection fraction was in the range of 60% to 65%.    Wall motion was normal; there were no regional wall motion    abnormalities. Doppler parameters are consistent with abnormal    left ventricular relaxation (grade 1  diastolic dysfunction).  - Aortic valve: Valve area (VTI): 2.4 cm^2. Valve area (Vmax): 2.29    cm^2. Valve area (Vmean): 2.58 cm^2.  - Left atrium: The atrium was mildly dilated.   >> History of mildly abnormal elevated A1c's: Will recheck and follow.  DVT prophylaxis:  Heparin  Consults:  None  Advance Care Planning:    Code Status: Full Code   Family Communication:  None  Disposition Plan:  Home  Severity of Illness: The appropriate patient status for this patient is OBSERVATION. Observation status is judged to be reasonable and necessary in order to provide the required intensity of service to ensure the patient's safety. The patient's presenting symptoms, physical exam findings, and initial radiographic and laboratory data in the context of their medical condition is felt to place them at decreased risk for further clinical deterioration. Furthermore, it is anticipated that the patient will be medically stable for discharge from the hospital within 2 midnights of admission.   Author: Gertha Calkin, MD 09/29/2022 10:23 PM  For on call review [email or web address].

## 2022-09-29 NOTE — ED Triage Notes (Signed)
Pt to triage via wheelchair.  Pt with a cough and wheezing for 2 days.  Hx copd.  Pt denies chest pain or sob.  Pt alert  speech clear.

## 2022-09-29 NOTE — ED Provider Notes (Signed)
[facility] Provider Note    Event Date/Time   First MD Initiated Contact with Patient 09/29/22 1932     (approximate)   History   Cough and Wheezing   HPI  Lauren Koch is a 72 y.o. female  here with cough,w heezing. Per pt and her daughter, pt has had increased cough, SOB x 2-3 days. Daughter came to check on her today and noticed audible wheezing and dyspnea, speaking in short sentences. Pt does admit sx have worsened over past 24 hours. She denies any sputum production. No fevers. She has not regularly been using her breathing tx. No LE edema. No chest pain.       Physical Exam   Triage Vital Signs: ED Triage Vitals  Encounter Vitals Group     BP 09/29/22 1925 (!) 212/116     Systolic BP Percentile --      Diastolic BP Percentile --      Pulse Rate 09/29/22 1925 91     Resp 09/29/22 1925 (!) 28     Temp 09/29/22 1925 98.4 F (36.9 C)     Temp Source 09/29/22 1925 Oral     SpO2 09/29/22 1925 93 %     Weight 09/29/22 1923 200 lb (90.7 kg)     Height 09/29/22 1923 5' (1.524 m)     Head Circumference --      Peak Flow --      Pain Score 09/29/22 1923 0     Pain Loc --      Pain Education --      Exclude from Growth Chart --     Most recent vital signs: Vitals:   09/29/22 2330 09/30/22 0111  BP: (!) 147/98 (!) 190/126  Pulse: 93 (!) 103  Resp: (!) 22 20  Temp: 98.3 F (36.8 C) 98 F (36.7 C)  SpO2: 93% 96%     General: Awake, no distress.  CV:  Good peripheral perfusion. RRR. Resp:  Normal work of breathing. Diffuse wheezing, diminished aeration, and tachypnea, speaking in short sentences. Abd:  No distention. No tenderness. Other:  1+ edema bl LE   ED Results / Procedures / Treatments   Labs (all labs ordered are listed, but only abnormal results are displayed) Labs Reviewed  CBC WITH DIFFERENTIAL/PLATELET - Abnormal; Notable for the following components:      Result Value   RBC 5.28 (*)    Hemoglobin 15.3 (*)    All  other components within normal limits  COMPREHENSIVE METABOLIC PANEL - Abnormal; Notable for the following components:   Glucose, Bld 117 (*)    Calcium 8.6 (*)    All other components within normal limits  BRAIN NATRIURETIC PEPTIDE - Abnormal; Notable for the following components:   B Natriuretic Peptide 106.1 (*)    All other components within normal limits  D-DIMER, QUANTITATIVE - Abnormal; Notable for the following components:   D-Dimer, Quant 0.58 (*)    All other components within normal limits  CBC - Abnormal; Notable for the following components:   RBC 5.14 (*)    All other components within normal limits  CREATININE, SERUM  HEMOGLOBIN A1C  TROPONIN I (HIGH SENSITIVITY)  TROPONIN I (HIGH SENSITIVITY)     EKG Normal sinus rhythm, VR 83. PR 162, QRS 110, QTc 445. No acute ST elevations or depressions. Non specific TW changes.   RADIOLOGY CXR: Clear   I also independently reviewed and agree with radiologist interpretations.   PROCEDURES:  Critical Care performed: Yes, see critical care procedure note(s)  .Critical Care  Performed by: Shaune Pollack, MD Authorized by: Shaune Pollack, MD   Critical care provider statement:    Critical care time (minutes):  30   Critical care time was exclusive of:  Separately billable procedures and treating other patients   Critical care was necessary to treat or prevent imminent or life-threatening deterioration of the following conditions:  Cardiac failure, circulatory failure and respiratory failure   Critical care was time spent personally by me on the following activities:  Development of treatment plan with patient or surrogate, discussions with consultants, evaluation of patient's response to treatment, examination of patient, ordering and review of laboratory studies, ordering and review of radiographic studies, ordering and performing treatments and interventions, pulse oximetry, re-evaluation of patient's condition and  review of old charts   Care discussed with: admitting provider       MEDICATIONS ORDERED IN ED: Medications  hydrochlorothiazide (HYDRODIURIL) tablet 12.5 mg (has no administration in time range)  ceFEPIme (MAXIPIME) 2 g in sodium chloride 0.9 % 100 mL IVPB (0 g Intravenous Stopped 09/29/22 2350)  methylPREDNISolone sodium succinate (SOLU-MEDROL) 125 mg/2 mL injection 60 mg (60 mg Intravenous Given 09/29/22 2217)  ipratropium-albuterol (DUONEB) 0.5-2.5 (3) MG/3ML nebulizer solution 3 mL (3 mLs Nebulization Given 09/29/22 2219)  albuterol (PROVENTIL) (2.5 MG/3ML) 0.083% nebulizer solution 2.5 mg (has no administration in time range)  sodium chloride flush (NS) 0.9 % injection 3 mL (3 mLs Intravenous Given 09/29/22 2219)  acetaminophen (TYLENOL) tablet 650 mg (has no administration in time range)    Or  acetaminophen (TYLENOL) suppository 650 mg (has no administration in time range)  guaiFENesin (MUCINEX) 12 hr tablet 600 mg (has no administration in time range)  hydrALAZINE (APRESOLINE) injection 5 mg (has no administration in time range)  heparin injection 5,000 Units (5,000 Units Subcutaneous Given 09/29/22 2219)  pantoprazole (PROTONIX) injection 40 mg (40 mg Intravenous Given 09/29/22 2216)  ipratropium-albuterol (DUONEB) 0.5-2.5 (3) MG/3ML nebulizer solution 3 mL (3 mLs Nebulization Given 09/29/22 2001)  ipratropium-albuterol (DUONEB) 0.5-2.5 (3) MG/3ML nebulizer solution 3 mL (3 mLs Nebulization Given 09/29/22 2000)  ipratropium-albuterol (DUONEB) 0.5-2.5 (3) MG/3ML nebulizer solution 3 mL (3 mLs Nebulization Given 09/29/22 2000)  methylPREDNISolone sodium succinate (SOLU-MEDROL) 125 mg/2 mL injection 125 mg (125 mg Intravenous Given 09/29/22 2009)  lactated ringers infusion ( Intravenous New Bag/Given 09/29/22 2215)     IMPRESSION / MDM / ASSESSMENT AND PLAN / ED COURSE  I reviewed the triage vital signs and the nursing notes.                              Differential diagnosis includes,  but is not limited to, COPD exacerbation, CHF with pulmonary edema, PNA, COVID-19  Patient's presentation is most consistent with acute presentation with potential threat to life or bodily function.  The patient is on the cardiac monitor to evaluate for evidence of arrhythmia and/or significant heart rate changes   72 yo F with h/o COPD, HTGN, GERD, here with SOB. Pt has diffuse wheezing on exam, increased WOB. Satting low 90s on RA. CXR is clear. EKG nonischemic, trop negative, BNP slightly elevated - query possible underlying R sided HF from her lung disease. CMP unremarkable. CBC shows no major leukocytosis. Pt given breathing tx x 3, IV solumedrol, and remains tachypneic with diffuse wheezing and increased WOB. Will admit to medicine.   FINAL CLINICAL IMPRESSION(S) / ED  DIAGNOSES   Final diagnoses:  COPD exacerbation (HCC)     Rx / DC Orders   ED Discharge Orders     None        Note:  This document was prepared using Dragon voice recognition software and may include unintentional dictation errors.   Shaune Pollack, MD 09/30/22 [phone]

## 2022-09-30 ENCOUNTER — Inpatient Hospital Stay: Payer: Medicare PPO

## 2022-09-30 DIAGNOSIS — J189 Pneumonia, unspecified organism: Secondary | ICD-10-CM | POA: Diagnosis present

## 2022-09-30 LAB — HEMOGLOBIN A1C
Hgb A1c MFr Bld: 5.4 % (ref 4.8–5.6)
Mean Plasma Glucose: 108.28 mg/dL

## 2022-09-30 MED ORDER — PANTOPRAZOLE SODIUM 40 MG PO TBEC
40.0000 mg | DELAYED_RELEASE_TABLET | Freq: Every day | ORAL | Status: DC
  Administered 2022-10-01 – 2022-10-03 (×3): 40 mg via ORAL
  Filled 2022-09-30 (×3): qty 1

## 2022-09-30 MED ORDER — IOHEXOL 350 MG/ML SOLN
80.0000 mL | Freq: Once | INTRAVENOUS | Status: AC | PRN
  Administered 2022-09-30: 80 mL via INTRAVENOUS

## 2022-09-30 MED ORDER — SODIUM CHLORIDE 0.9 % IV SOLN
500.0000 mg | INTRAVENOUS | Status: AC
  Administered 2022-09-30 – 2022-10-02 (×3): 500 mg via INTRAVENOUS
  Filled 2022-09-30 (×3): qty 5

## 2022-09-30 MED ORDER — ENSURE ENLIVE PO LIQD
237.0000 mL | Freq: Two times a day (BID) | ORAL | Status: DC
  Administered 2022-10-02 – 2022-10-03 (×3): 237 mL via ORAL

## 2022-09-30 MED ORDER — ADULT MULTIVITAMIN W/MINERALS CH
1.0000 | ORAL_TABLET | Freq: Every day | ORAL | Status: DC
  Administered 2022-10-01 – 2022-10-03 (×3): 1 via ORAL
  Filled 2022-09-30 (×3): qty 1

## 2022-09-30 NOTE — Evaluation (Signed)
Occupational Therapy Evaluation Patient Details Name: Lauren Koch MRN: [id number] DOB: 18-Jun-1950 Today's Date: 09/30/2022   History of Present Illness presented to ER secondary to progressive wheezing, cough; admitted for management of multifocal PNA   Clinical Impression   Pt in recliner upon arrival, daughter present and agreeable to OT evaluation. PTA, pt lives with daughter, no recent fall history, MOD I to IND for ADLs (daughter does supervise for showers), does not drive at baseline. Completes mobility with no AD, does endorse use of SPC for community distances. No pain, reports overall improvement in respiratory status but audibly SOB. BUE grossly 4/5 MMT, ROM WNL. Completes sit to stands and transfers from recliner <> chair with CGA/minA. SpO2 on RA 95% during OT eval. Patient presenting with decreased tolerance to activity, generalized weakness and overall decline from baseline in ADL functions. Patient will benefit from acute OT to increase overall independence in the areas of ADLs, functional mobility in order to safely discharge.        If plan is discharge home, recommend the following: A little help with walking and/or transfers;A little help with bathing/dressing/bathroom;Assist for transportation;Help with stairs or ramp for entrance;Assistance with cooking/housework;Direct supervision/assist for medications management;Direct supervision/assist for financial management    Functional Status Assessment  Patient has had a recent decline in their functional status and demonstrates the ability to make significant improvements in function in a reasonable and predictable amount of time.  Equipment Recommendations  BSC/3in1       Precautions / Restrictions Precautions Precautions: Fall Restrictions Weight Bearing Restrictions: No      Mobility Bed Mobility                    Transfers Overall transfer level: Needs assistance Equipment used: Rolling walker (2  wheels) Transfers: Sit to/from Stand, Bed to chair/wheelchair/BSC Sit to Stand: Contact guard assist Stand pivot transfers: Contact guard assist         General transfer comment: first sit to stand pt requires increased vcs, mild unsteadiness      Balance Overall balance assessment: Needs assistance Sitting-balance support: No upper extremity supported, Feet supported Sitting balance-Leahy Scale: Good     Standing balance support: Bilateral upper extremity supported Standing balance-Leahy Scale: Fair Standing balance comment: mild unsteadiness inital attempt                           ADL either performed or assessed with clinical judgement   ADL Overall ADL's : Needs assistance/impaired Eating/Feeding: Set up;Sitting Eating/Feeding Details (indicate cue type and reason): finishing lunch in recliner upon OT arrival                     Toilet Transfer: Contact guard assist;Stand-pivot Toilet Transfer Details (indicate cue type and reason): simulated from recliner to EOB, pt requires CGA, no AD         Functional mobility during ADLs: Supervision/safety;Rolling walker (2 wheels) General ADL Comments: Pt would require minA to don socks/shoes as she has a difficult time bending over due to body habitus (does not wear socks at home). Pt limited functionally by SOB with exertion - anticipate excessive time for all BADLs with overall CGA for UB, minA for LB. Transfers CGA, would be SOB with t/f mobility     Vision Baseline Vision/History: 1 Wears glasses Patient Visual Report: No change from baseline              Pertinent  Vitals/Pain Pain Assessment Pain Assessment: No/denies pain     Extremity/Trunk Assessment Upper Extremity Assessment Upper Extremity Assessment: Generalized weakness;Overall [facility] for tasks assessed (grossly 4/5 throughout)   Lower Extremity Assessment Lower Extremity Assessment: Defer to PT evaluation;Overall WFL for tasks  assessed       Communication Communication Communication: No apparent difficulties   Cognition Arousal: Alert Behavior During Therapy: WFL for tasks assessed/performed Overall Cognitive Status: Within Functional Limits for tasks assessed                                                  Home Living Family/patient expects to be discharged to:: Private residence Living Arrangements: Children Available Help at Discharge: Family Type of Home: Mobile home Home Access: Stairs to enter Entrance Stairs-Number of Steps: 3 Entrance Stairs-Rails: Right;Left;Can reach both Home Layout: One level     Bathroom Shower/Tub: Producer, television/film/video: Standard     Home Equipment: Agricultural consultant (2 wheels);Cane - single point;Shower seat - built in;Hand held shower head          Prior Functioning/Environment Prior Level of Function : Independent/Modified Independent             Mobility Comments: Sup/mod indep with household and limited community mobilization; intermittent use of SPC (definitely for outside of home).  Denies fall history. Does have home O2, but uses "only when needed" ADLs Comments: Patient able to dress self indep; daughter assists with bathing routine and household chores/responsibilities as needed        OT Problem List: Decreased activity tolerance;Impaired balance (sitting and/or standing);Cardiopulmonary status limiting activity;Decreased strength      OT Treatment/Interventions: Self-care/ADL training;Therapeutic exercise;Energy conservation;DME and/or AE instruction;Therapeutic activities;Patient/family education;Balance training    OT Goals(Current goals can be found in the care plan section) Acute Rehab OT Goals Patient Stated Goal: to go home OT Goal Formulation: With patient/family Time For Goal Achievement: 10/14/22 Potential to Achieve Goals: Good  OT Frequency: Min 1X/week       AM-PAC OT "6 Clicks" Daily Activity      Outcome Measure Help from another person eating meals?: None Help from another person taking care of personal grooming?: None Help from another person toileting, which includes using toliet, bedpan, or urinal?: A Little Help from another person bathing (including washing, rinsing, drying)?: A Lot Help from another person to put on and taking off regular upper body clothing?: A Little Help from another person to put on and taking off regular lower body clothing?: A Little 6 Click Score: 19   End of Session Equipment Utilized During Treatment: Gait belt;Rolling walker (2 wheels) Nurse Communication: Other (comment) (RN enters room upon OT leaving)  Activity Tolerance: Patient tolerated treatment well Patient left: in chair;with call bell/phone within reach;with chair alarm set;with family/visitor present  OT Visit Diagnosis: Muscle weakness (generalized) (M62.81);Other abnormalities of gait and mobility (R26.89)                Time: 1478-2956 OT Time Calculation (min): 17 min Charges:  OT General Charges $OT Visit: 1 Visit OT Evaluation $OT Eval Low Complexity: 1 Low Leidi Astle L. Kona Lover, OTR/L  09/30/22, 4:40 PM

## 2022-09-30 NOTE — Plan of Care (Signed)
  Problem: Education: Goal: Knowledge of General Education information will improve Description: Including pain rating scale, medication(s)/side effects and non-pharmacologic comfort measures Outcome: Progressing   Problem: Health Behavior/Discharge Planning: Goal: Ability to manage health-related needs will improve Outcome: Completed/Met   Problem: Clinical Measurements: Goal: Ability to maintain clinical measurements within normal limits will improve Outcome: Progressing Goal: Will remain free from infection Outcome: Progressing Goal: Respiratory complications will improve Outcome: Progressing Goal: Cardiovascular complication will be avoided Outcome: Progressing   Problem: Activity: Goal: Risk for activity intolerance will decrease Outcome: Completed/Met   Problem: Nutrition: Goal: Adequate nutrition will be maintained Outcome: Progressing   Problem: Coping: Goal: Level of anxiety will decrease Outcome: Progressing   Problem: Elimination: Goal: Will not experience complications related to bowel motility Outcome: Progressing   Problem: Pain Managment: Goal: General experience of comfort will improve Outcome: Completed/Met   Problem: Skin Integrity: Goal: Risk for impaired skin integrity will decrease Outcome: Completed/Met   Problem: Education: Goal: Knowledge of General Education information will improve Description: Including pain rating scale, medication(s)/side effects and non-pharmacologic comfort measures Outcome: Progressing   Problem: Health Behavior/Discharge Planning: Goal: Ability to manage health-related needs will improve Outcome: Progressing   Problem: Clinical Measurements: Goal: Ability to maintain clinical measurements within normal limits will improve Outcome: Progressing Goal: Will remain free from infection Outcome: Progressing Goal: Diagnostic test results will improve Outcome: Progressing Goal: Respiratory complications will  improve Outcome: Progressing

## 2022-09-30 NOTE — Progress Notes (Signed)
Progress Note    Lauren Koch  OZH:[id number] DOB: Feb 22, 1950  DOA: 09/29/2022 PCP: Pcp, No      Brief Narrative:    Medical records reviewed and are as summarized below:  Lauren Koch is a 72 y.o. female with medical history significant for hypertension, GERD, stroke, diastolic dysfunction, history of smoking, who presented to the hospital with cough, wheezing of about 2 to 3 days duration.      Assessment/Plan:   Principal Problem:   Multifocal pneumonia Active Problems:   Asthma   COPD with acute exacerbation (HCC)   GERD (gastroesophageal reflux disease)   Wheezing   Essential hypertension    Body mass index is 39.06 kg/m.  (Obesity)   Multifocal pneumonia: Continue IV cefepime.  Add IV azithromycin. No evidence of PE on CT.  BNP 106.1.   COPD exacerbation: Continue steroid, bronchodilators and antibiotics.   Acute hypoxic respiratory failure: Continue 2 L/min oxygen via [place].  Wean off oxygen as able   Other comorbidities include hypertension, chronic diastolic CHF, history of stroke   Diet Order             Diet Heart Room service appropriate? Yes; Fluid consistency: Thin  Diet effective now                            Consultants: None  Procedures: None    Medications:    heparin  5,000 Units Subcutaneous Q8H   hydrochlorothiazide  12.5 mg Oral Daily   ipratropium-albuterol  3 mL Nebulization Q6H   methylPREDNISolone (SOLU-MEDROL) injection  60 mg Intravenous Q12H   pantoprazole (PROTONIX) IV  40 mg Intravenous Q12H   sodium chloride flush  3 mL Intravenous Q12H   Continuous Infusions:  azithromycin     ceFEPime (MAXIPIME) IV 2 g (09/30/22 1022)     Anti-infectives (From admission, onward)    Start     Dose/Rate Route Frequency Ordered Stop   09/30/22 1300  azithromycin (ZITHROMAX) 500 mg in sodium chloride 0.9 % 250 mL IVPB        500 mg 250 mL/hr over 60 Minutes Intravenous Every 24 hours 09/30/22 1204  10/03/22 1259   09/29/22 2215  ceFEPIme (MAXIPIME) 2 g in sodium chloride 0.9 % 100 mL IVPB        2 g 200 mL/hr over 30 Minutes Intravenous Every 12 hours 09/29/22 2201 10/04/22 2159              Family Communication/Anticipated D/C date and plan/Code Status   DVT prophylaxis: heparin injection 5,000 Units Start: 09/29/22 2215     Code Status: Full Code  Family Communication: Plan discussed with Tabitha, daughter, at the bedside Disposition Plan: Plan to discharge home in 2 to 3 days   Status is: Inpatient Remains inpatient appropriate because: Pneumonia, COPD exacerbation       Subjective:   Interval events noted.  She complains of cough and wheezing.  Breathing is a little better.  No chest pain.  Lauren Koch, daughter, was at the bedside  Objective:    Vitals:   09/30/22 0300 09/30/22 0417 09/30/22 0856 09/30/22 1122  BP:  (!) 176/96 (!) 162/90 (!) 162/78  Pulse:  95 92 87  Resp:  18 18 20   Temp:  (!) 97.5 F (36.4 C) (!) 97.5 F (36.4 C) 97.7 F (36.5 C)  TempSrc:  Oral Oral Oral  SpO2: 97% 97% 95% 93%  Weight:  Height:       No data found.   Intake/Output Summary (Last 24 hours) at 09/30/2022 1302 Last data filed at 09/30/2022 0130 Gross per 24 hour  Intake 340 ml  Output --  Net 340 ml   Filed Weights   09/29/22 1923  Weight: 90.7 kg    Exam:  GEN: NAD SKIN: Warm and dry EYES: PERRL no icterus ENT: MMM CV: RRR PULM: Bilateral rhonchi and wheezing ABD: soft, obese, NT, +BS CNS: AAO x 3, non focal EXT: No edema or tenderness        Data Reviewed:   I have personally reviewed following labs and imaging studies:  Labs: Labs show the following:   Basic Metabolic Panel: Recent Labs  Lab 09/29/22 2008 09/29/22 2224  NA 140  --   K 3.8  --   CL 105  --   CO2 27  --   GLUCOSE 117*  --   BUN 13  --   CREATININE 0.88 0.89  CALCIUM 8.6*  --    GFR Estimated Creatinine Clearance: 58.2 mL/min (by C-G formula based on  SCr of 0.89 mg/dL). Liver Function Tests: Recent Labs  Lab 09/29/22 2008  AST 21  ALT 15  ALKPHOS 95  BILITOT 0.5  PROT 7.6  ALBUMIN 3.6   No results for input(s): "LIPASE", "AMYLASE" in the last 168 hours. No results for input(s): "AMMONIA" in the last 168 hours. Coagulation profile No results for input(s): "INR", "PROTIME" in the last 168 hours.  CBC: Recent Labs  Lab 09/29/22 2008 09/29/22 2224  WBC 7.1 9.9  NEUTROABS 4.5  --   HGB 15.3* 14.7  HCT 45.3 44.6  MCV 85.8 86.8  PLT 259 268   Cardiac Enzymes: No results for input(s): "CKTOTAL", "CKMB", "CKMBINDEX", "TROPONINI" in the last 168 hours. BNP (last 3 results) No results for input(s): "PROBNP" in the last 8760 hours. CBG: No results for input(s): "GLUCAP" in the last 168 hours. D-Dimer: Recent Labs    09/29/22 2224  DDIMER 0.58*   Hgb A1c: Recent Labs    09/29/22 2224  HGBA1C 5.4   Lipid Profile: No results for input(s): "CHOL", "HDL", "LDLCALC", "TRIG", "CHOLHDL", "LDLDIRECT" in the last 72 hours. Thyroid function studies: No results for input(s): "TSH", "T4TOTAL", "T3FREE", "THYROIDAB" in the last 72 hours.  Invalid input(s): "FREET3" Anemia work up: No results for input(s): "VITAMINB12", "FOLATE", "FERRITIN", "TIBC", "IRON", "RETICCTPCT" in the last 72 hours. Sepsis Labs: Recent Labs  Lab 09/29/22 2008 09/29/22 2224  WBC 7.1 9.9    Microbiology No results found for this or any previous visit (from the past 240 hour(s)).  Procedures and diagnostic studies:  CT Angio Chest Pulmonary Embolism (PE) W or WO Contrast  Result Date: 09/30/2022 CLINICAL DATA:  Pulmonary embolism (PE) suspected, high prob. Wheezing and cough. EXAM: CT ANGIOGRAPHY CHEST WITH CONTRAST TECHNIQUE: Multidetector CT imaging of the chest was performed using the standard protocol during bolus administration of intravenous contrast. Multiplanar CT image reconstructions and MIPs were obtained to evaluate the vascular  anatomy. RADIATION DOSE REDUCTION: This exam was performed according to the departmental dose-optimization program which includes automated exposure control, adjustment of the mA and/or kV according to patient size and/or use of iterative reconstruction technique. CONTRAST:  80mL OMNIPAQUE IOHEXOL 350 MG/ML SOLN COMPARISON:  CTA chest 11/26/2017. FINDINGS: Cardiovascular: Satisfactory opacification of the pulmonary arteries to the segmental level. No evidence of pulmonary embolism. Normal heart size. No pericardial effusion. Right-sided aortic arch with aberrant left subclavian artery.  Mediastinum/Nodes: No suspicious thoracic lymphadenopathy. Small hiatal hernia. Lungs/Pleura: Diffuse bronchial wall thickening with scattered, patchy areas of consolidation throughout both lungs. No pleural effusion or pneumothorax. Upper Abdomen: No acute abnormality. Musculoskeletal: No chest wall abnormality. No acute or significant osseous findings. Review of the MIP images confirms the above findings. IMPRESSION: 1. No evidence of pulmonary embolism. 2. Diffuse bronchial wall thickening with scattered, patchy areas of consolidation throughout both lungs, consistent with multifocal pneumonia. 3. Right-sided aortic arch with aberrant left subclavian artery. Electronically Signed   By: Orvan Falconer M.D.   On: 09/30/2022 08:43   DG Chest Portable 1 View  Result Date: 09/29/2022 CLINICAL DATA:  Shortness of breath with wheezing EXAM: PORTABLE CHEST 1 VIEW COMPARISON:  Chest x-ray 11/03/2019.  CT angiogram chest 11/26/2017. FINDINGS: The heart size and mediastinal contours are within normal limits. Right-sided aortic arch again noted. Both lungs are clear. The visualized skeletal structures are unremarkable. IMPRESSION: No active disease. Electronically Signed   By: Darliss Cheney M.D.   On: 09/29/2022 20:11               LOS: 1 day   Rhyder Koegel  [facility]   Pager on [email or web address]. If 7PM-7AM, please  contact night-coverage at [email or web address]     09/30/2022, 1:02 PM

## 2022-09-30 NOTE — Evaluation (Signed)
Physical Therapy Evaluation Patient Details Name: Lauren Koch MRN: [id number] DOB: 20-Jan-1951 Today's Date: 09/30/2022  History of Present Illness  presented to ER secondary to progressive wheezing, cough; admitted for management of multifocal PNA  Clinical Impression  Patient resting in bed upon arrival to room; daughter present at bedside throughout session.  Patient alert and oriented, follows commands and agreeable to participation with session.  Denies pain; does endorse some improvement in respiratory status, but still audibly SOB/wheezy.  Bilat UE/LE strength and ROM grossly symmetrical and WFL; no focal weakness appreciated.  Able to complete bed mobility with mod indep; sit/stand, basic transfers and short-distance gait with RW, cga/min assist.  Demonstrates forward flexed posture, short shuffling steps; significant SOB/wheezing with minimal gait activities, BORG 8/10 with sats >90% on RA throughout. Additional distance/activity deferred due to SOB.  Will continue to assess/progress as appropriate in subsequent sessions. Would benefit from skilled PT to address above deficits and promote optimal return to PLOF.; recommend post-acute PT follow up as indicated by interdisciplinary care team.          If plan is discharge home, recommend the following: A little help with walking and/or transfers;A little help with bathing/dressing/bathroom;Direct supervision/assist for financial management;Assistance with cooking/housework;Assist for transportation;Direct supervision/assist for medications management;Help with stairs or ramp for entrance   Can travel by private vehicle        Equipment Recommendations  (has necessary equip)  Recommendations for Other Services       Functional Status Assessment Patient has had a recent decline in their functional status and demonstrates the ability to make significant improvements in function in a reasonable and predictable amount of time.      Precautions / Restrictions Precautions Precautions: Fall Restrictions Weight Bearing Restrictions: No      Mobility  Bed Mobility Overal bed mobility: Modified Independent                  Transfers Overall transfer level: Needs assistance Equipment used: Rolling walker (2 wheels) Transfers: Sit to/from Stand, Bed to chair/wheelchair/BSC Sit to Stand: Contact guard assist Stand pivot transfers: Contact guard assist         General transfer comment: tends to pull up on RW despite cuing    Ambulation/Gait Ambulation/Gait assistance: Contact guard assist Gait Distance (Feet):  (8' x2 laterally, 4' forward/backward) Assistive device: Rolling walker (2 wheels)         General Gait Details: forward flexed posture, short shuffling steps; significant SOB/wheezing with minimal gait activities, BORG 8/10 with sats >90% on RA throughout.  Additional distance/activity deferred due to SOB  Stairs            Wheelchair Mobility     Tilt Bed    Modified Rankin (Stroke Patients Only)       Balance Overall balance assessment: Needs assistance Sitting-balance support: No upper extremity supported, Feet supported Sitting balance-Leahy Scale: Good     Standing balance support: Bilateral upper extremity supported Standing balance-Leahy Scale: Fair                               Pertinent Vitals/Pain Pain Assessment Pain Assessment: No/denies pain    Home Living Family/patient expects to be discharged to:: Private residence Living Arrangements: Children Available Help at Discharge: Family Type of Home: Mobile home Home Access: Stairs to enter Entrance Stairs-Rails: Right;Left;Can reach both Entrance Stairs-Number of Steps: 3   Home Layout: One level Home Equipment:  Rolling Walker (2 wheels);Cane - single point      Prior Function Prior Level of Function : Independent/Modified Independent             Mobility Comments: Sup/mod indep  with household and limited community mobilization; intermittent use of SPC (definitely for outside of home).  Denies fall history. Does have home O2, but uses "only when needed" ADLs Comments: Patient able to dress self indep; daughter assists with bathing routine and household chores/responsibilities as needed     Extremity/Trunk Assessment   Upper Extremity Assessment Upper Extremity Assessment: Overall WFL for tasks assessed    Lower Extremity Assessment Lower Extremity Assessment: Overall WFL for tasks assessed (grossly at least 4/5 throughout)       Communication      Cognition Arousal: Alert Behavior During Therapy: [facility] for tasks assessed/performed Overall Cognitive Status: Within Functional Limits for tasks assessed                                          General Comments      Exercises     Assessment/Plan    PT Assessment Patient needs continued PT services  PT Problem List Decreased strength;Decreased activity tolerance;Decreased balance;Decreased mobility;Decreased knowledge of use of DME;Decreased safety awareness;Decreased knowledge of precautions;Cardiopulmonary status limiting activity       PT Treatment Interventions DME instruction;Gait training;Stair training;Functional mobility training;Therapeutic exercise;Therapeutic activities;Balance training;Patient/family education    PT Goals (Current goals can be found in the Care Plan section)  Acute Rehab PT Goals Patient Stated Goal: to get better and go home PT Goal Formulation: With patient/family Time For Goal Achievement: 10/14/22 Potential to Achieve Goals: Good    Frequency Min 1X/week     Co-evaluation               AM-PAC PT "6 Clicks" Mobility  Outcome Measure Help needed turning from your back to your side while in a flat bed without using bedrails?: None Help needed moving from lying on your back to sitting on the side of a flat bed without using bedrails?:  None Help needed moving to and from a bed to a chair (including a wheelchair)?: A Little Help needed standing up from a chair using your arms (e.g., wheelchair or bedside chair)?: A Little Help needed to walk in hospital room?: A Little Help needed climbing 3-5 steps with a railing? : A Little 6 Click Score: 20    End of Session   Activity Tolerance: Patient tolerated treatment well Patient left: in chair;with call bell/phone within reach;with chair alarm set;with family/visitor present Nurse Communication: Mobility status PT Visit Diagnosis: Muscle weakness (generalized) (M62.81);Difficulty in walking, not elsewhere classified (R26.2)    Time: 1133-1150 PT Time Calculation (min) (ACUTE ONLY): 17 min   Charges:   PT Evaluation $PT Eval Moderate Complexity: 1 Mod   PT General Charges $$ ACUTE PT VISIT: 1 Visit         [place] Mckimmy H. Manson Passey, PT, DPT, NCS 09/30/22, 2:36 PM [phone]

## 2022-09-30 NOTE — Progress Notes (Addendum)
Initial Nutrition Assessment  DOCUMENTATION CODES:   Not applicable  INTERVENTION:   Ensure Enlive po BID, each supplement provides 350 kcal and 20 grams of protein.  MVI po daily   Liberalize diet   NUTRITION DIAGNOSIS:   Increased nutrient needs related to catabolic illness as evidenced by estimated needs.  GOAL:   Patient will meet greater than or equal to 90% of their needs  MONITOR:   PO intake, Supplement acceptance, Labs, Weight trends, I & O's, Skin  REASON FOR ASSESSMENT:   Consult Assessment of nutrition requirement/status  ASSESSMENT:   72 y/o female with h/o COPD, asthma, GERD, HTN, stroke, HLD and dementia who is admitted with COPD exacerbation.  Met with pt and pt's daughter in room today. Daughter reports pt with good appetite and oral intake at baseline. Pt eating 100% of meals in hospital. Of note, pt hates eggs. RD discussed with pt the importance of adequate nutrition needed to preserve lean muscle. Pt is willing to drink chocolate Ensure in hospital. RD will add supplements and MVI to help pt meet her estimated needs. RD will also liberalize pt's diet. There is no recent weight history in chart to determine if any significant changes. Daughter reports that pt is weight stable at baseline.    Medications reviewed and include: heparin, hydrochlorothiazide, solu-medrol, protonix, azithromycin, cefepime   Labs reviewed: K 3.8 wnl  NUTRITION - FOCUSED PHYSICAL EXAM:  Flowsheet Row Most Recent Value  Orbital Region No depletion  Upper Arm Region No depletion  Thoracic and Lumbar Region No depletion  Buccal Region No depletion  Temple Region No depletion  Clavicle Bone Region No depletion  Clavicle and Acromion Bone Region No depletion  Scapular Bone Region No depletion  Dorsal Hand No depletion  Patellar Region No depletion  Anterior Thigh Region No depletion  Posterior Calf Region No depletion  Edema (RD Assessment) None  Hair Reviewed  Eyes  Reviewed  Mouth Reviewed  Skin Reviewed  Nails Reviewed   Diet Order:   Diet Order             Diet regular Room service appropriate? No; Fluid consistency: Thin  Diet effective now                  EDUCATION NEEDS:   Education needs have been addressed  Skin:  Skin Assessment: Reviewed RN Assessment  Last BM:  8/28  Height:   Ht Readings from Last 1 Encounters:  09/29/22 5' (1.524 m)    Weight:   Wt Readings from Last 1 Encounters:  09/29/22 90.7 kg    Ideal Body Weight:  45.45 kg  BMI:  Body mass index is 39.06 kg/m.  Estimated Nutritional Needs:   Kcal:  1800-2100kcal/day  Protein:  90-105g/day  Fluid:  1.4-1.6L/day  Betsey Holiday MS, RD, LDN Please refer to [facility] for RD and/or RD on-call/weekend/after hours pager

## 2022-10-01 DIAGNOSIS — J189 Pneumonia, unspecified organism: Secondary | ICD-10-CM | POA: Diagnosis not present

## 2022-10-01 MED ORDER — IPRATROPIUM-ALBUTEROL 0.5-2.5 (3) MG/3ML IN SOLN
3.0000 mL | Freq: Two times a day (BID) | RESPIRATORY_TRACT | Status: DC
  Administered 2022-10-01 – 2022-10-02 (×2): 3 mL via RESPIRATORY_TRACT
  Filled 2022-10-01 (×2): qty 3

## 2022-10-01 MED ORDER — ENOXAPARIN SODIUM 60 MG/0.6ML IJ SOSY
45.0000 mg | PREFILLED_SYRINGE | Freq: Every day | INTRAMUSCULAR | Status: DC
  Administered 2022-10-01 – 2022-10-02 (×2): 45 mg via SUBCUTANEOUS
  Filled 2022-10-01 (×2): qty 0.6

## 2022-10-01 NOTE — Plan of Care (Signed)
  Problem: Education: Goal: Knowledge of disease or condition will improve Outcome: Progressing Goal: Knowledge of the prescribed therapeutic regimen will improve Outcome: Progressing Goal: Individualized Educational Video(s) Outcome: Progressing   Problem: Activity: Goal: Ability to tolerate increased activity will improve Outcome: Progressing Goal: Will verbalize the importance of balancing activity with adequate rest periods Outcome: Progressing   Problem: Respiratory: Goal: Ability to maintain a clear airway will improve Outcome: Progressing Goal: Levels of oxygenation will improve Outcome: Progressing Goal: Ability to maintain adequate ventilation will improve Outcome: Progressing   Problem: Education: Goal: Knowledge of General Education information will improve Description: Including pain rating scale, medication(s)/side effects and non-pharmacologic comfort measures Outcome: Progressing   Problem: Clinical Measurements: Goal: Ability to maintain clinical measurements within normal limits will improve Outcome: Progressing Goal: Will remain free from infection Outcome: Progressing Goal: Diagnostic test results will improve Outcome: Progressing Goal: Respiratory complications will improve Outcome: Progressing Goal: Cardiovascular complication will be avoided Outcome: Progressing   Problem: Nutrition: Goal: Adequate nutrition will be maintained Outcome: Progressing   Problem: Coping: Goal: Level of anxiety will decrease Outcome: Progressing   Problem: Elimination: Goal: Will not experience complications related to bowel motility Outcome: Progressing Goal: Will not experience complications related to urinary retention Outcome: Progressing   Problem: Safety: Goal: Ability to remain free from injury will improve Outcome: Progressing   Problem: Education: Goal: Knowledge of General Education information will improve Description: Including pain rating scale,  medication(s)/side effects and non-pharmacologic comfort measures Outcome: Progressing   Problem: Health Behavior/Discharge Planning: Goal: Ability to manage health-related needs will improve Outcome: Progressing   Problem: Clinical Measurements: Goal: Ability to maintain clinical measurements within normal limits will improve Outcome: Progressing Goal: Will remain free from infection Outcome: Progressing Goal: Diagnostic test results will improve Outcome: Progressing Goal: Respiratory complications will improve Outcome: Progressing Goal: Cardiovascular complication will be avoided Outcome: Progressing   Problem: Activity: Goal: Risk for activity intolerance will decrease Outcome: Progressing   Problem: Nutrition: Goal: Adequate nutrition will be maintained Outcome: Progressing   Problem: Coping: Goal: Level of anxiety will decrease Outcome: Progressing   Problem: Elimination: Goal: Will not experience complications related to bowel motility Outcome: Progressing Goal: Will not experience complications related to urinary retention Outcome: Progressing   Problem: Pain Managment: Goal: General experience of comfort will improve Outcome: Progressing   Problem: Safety: Goal: Ability to remain free from injury will improve Outcome: Progressing   Problem: Skin Integrity: Goal: Risk for impaired skin integrity will decrease Outcome: Progressing

## 2022-10-01 NOTE — Care Management Important Message (Signed)
Important Message  Patient Details  Name: Lauren Koch MRN: [id number] Date of Birth: 1950-10-24   Medicare Important Message Given:  N/A - LOS <3 / Initial given by admissions     Johnell Comings 10/01/2022, 8:33 AM

## 2022-10-01 NOTE — Progress Notes (Signed)
Mobility Specialist - Progress Note Post-mobility: HR (116) , SPO2(96)     10/01/22 1004  Mobility  Activity Ambulated with assistance in hallway  Level of Assistance Contact guard assist, steadying assist  Assistive Device Other (Comment) (HHA)  Distance Ambulated (ft) 25 ft  Range of Motion/Exercises Active  Activity Response Tolerated well  Mobility Referral Yes  $Mobility charge 1 Mobility  Mobility Specialist Start Time (ACUTE ONLY) [id number]  Mobility Specialist Stop Time (ACUTE ONLY) 1004  Mobility Specialist Time Calculation (min) (ACUTE ONLY) 13 min   Pt resting in bed on RA upon entry. Pt STS and ambulates to door CGA with periodic HHA. Pt had a brief wobble but recovered quickly. Pt returned to bed and left with needs in reach.   Lauren Koch Mobility Specialist 10/01/22, 10:08 AM

## 2022-10-01 NOTE — Progress Notes (Signed)
Progress Note    Lauren Koch  MVH:[id number] DOB: March 18, 1950  DOA: 09/29/2022 PCP: Pcp, No      Brief Narrative:    Medical records reviewed and are as summarized below:  Lauren Koch is a 72 y.o. female with medical history significant for hypertension, GERD, stroke, diastolic dysfunction, history of smoking, who presented to the hospital with cough, wheezing of about 2 to 3 days duration.      Assessment/Plan:   Principal Problem:   Multifocal pneumonia Active Problems:   Asthma   COPD with acute exacerbation (HCC)   GERD (gastroesophageal reflux disease)   Wheezing   Essential hypertension    Body mass index is 39.06 kg/m.  (Obesity)   Multifocal pneumonia: Continue IV cefepime and azithromycin. No evidence of PE on CT.  BNP 106.1.   COPD exacerbation: Continue IV steroids and bronchodilators   Acute hypoxic respiratory failure: Improved.  She is tolerating room air.     Other comorbidities include hypertension, chronic diastolic CHF, history of stroke   Diet Order             Diet regular Room service appropriate? No; Fluid consistency: Thin  Diet effective now                            Consultants: None  Procedures: None    Medications:    enoxaparin (LOVENOX) injection  45 mg Subcutaneous QHS   feeding supplement  237 mL Oral BID BM   hydrochlorothiazide  12.5 mg Oral Daily   ipratropium-albuterol  3 mL Nebulization BID   multivitamin with minerals  1 tablet Oral Daily   pantoprazole  40 mg Oral Daily   sodium chloride flush  3 mL Intravenous Q12H   Continuous Infusions:  azithromycin 500 mg (10/01/22 1427)   ceFEPime (MAXIPIME) IV 2 g (10/01/22 1029)     Anti-infectives (From admission, onward)    Start     Dose/Rate Route Frequency Ordered Stop   09/30/22 1300  azithromycin (ZITHROMAX) 500 mg in sodium chloride 0.9 % 250 mL IVPB        500 mg 250 mL/hr over 60 Minutes Intravenous Every 24 hours  09/30/22 1204 10/03/22 1259   09/29/22 2215  ceFEPIme (MAXIPIME) 2 g in sodium chloride 0.9 % 100 mL IVPB        2 g 200 mL/hr over 30 Minutes Intravenous Every 12 hours 09/29/22 2201 10/04/22 2159              Family Communication/Anticipated D/C date and plan/Code Status   DVT prophylaxis:      Code Status: Full Code  Family Communication: Plan discussed with Tabitha, daughter, at the bedside Disposition Plan: Plan to discharge home in 1 to 2 days   Status is: Inpatient Remains inpatient appropriate because: Pneumonia, COPD exacerbation       Subjective:   Interval events noted.  She complains of cough and wheezing.  No chest pain or shortness of breath.  Wyatt Mage, daughter, was at the bedside  Objective:    Vitals:   10/01/22 0735 10/01/22 0750 10/01/22 1209 10/01/22 1550  BP: (!) 165/90  (!) 155/71 (!) 122/96  Pulse: 70  85 91  Resp: 18  (!) 22 18  Temp: 98.2 F (36.8 C)  97.6 F (36.4 C) 97.6 F (36.4 C)  TempSrc: Oral     SpO2: 96% 94% 96% 96%  Weight:  Height:       No data found.   Intake/Output Summary (Last 24 hours) at 10/01/2022 1740 Last data filed at 10/01/2022 1438 Gross per 24 hour  Intake 720 ml  Output --  Net 720 ml   Filed Weights   09/29/22 1923  Weight: 90.7 kg    Exam:  GEN: No acute respiratory distress, sitting up in the chair SKIN: Warm and dry EYES: No pallor or icterus ENT: MMM CV: RRR PULM: Diffuse bilateral expiratory wheezing ABD: soft, ND, NT, +BS CNS: AAO x 3, non focal EXT: No edema or tenderness       Data Reviewed:   I have personally reviewed following labs and imaging studies:  Labs: Labs show the following:   Basic Metabolic Panel: Recent Labs  Lab 09/29/22 2008 09/29/22 2224  NA 140  --   K 3.8  --   CL 105  --   CO2 27  --   GLUCOSE 117*  --   BUN 13  --   CREATININE 0.88 0.89  CALCIUM 8.6*  --    GFR Estimated Creatinine Clearance: 58.2 mL/min (by C-G formula based on  SCr of 0.89 mg/dL). Liver Function Tests: Recent Labs  Lab 09/29/22 2008  AST 21  ALT 15  ALKPHOS 95  BILITOT 0.5  PROT 7.6  ALBUMIN 3.6   No results for input(s): "LIPASE", "AMYLASE" in the last 168 hours. No results for input(s): "AMMONIA" in the last 168 hours. Coagulation profile No results for input(s): "INR", "PROTIME" in the last 168 hours.  CBC: Recent Labs  Lab 09/29/22 2008 09/29/22 2224  WBC 7.1 9.9  NEUTROABS 4.5  --   HGB 15.3* 14.7  HCT 45.3 44.6  MCV 85.8 86.8  PLT 259 268   Cardiac Enzymes: No results for input(s): "CKTOTAL", "CKMB", "CKMBINDEX", "TROPONINI" in the last 168 hours. BNP (last 3 results) No results for input(s): "PROBNP" in the last 8760 hours. CBG: No results for input(s): "GLUCAP" in the last 168 hours. D-Dimer: Recent Labs    09/29/22 2224  DDIMER 0.58*   Hgb A1c: Recent Labs    09/29/22 2224  HGBA1C 5.4   Lipid Profile: No results for input(s): "CHOL", "HDL", "LDLCALC", "TRIG", "CHOLHDL", "LDLDIRECT" in the last 72 hours. Thyroid function studies: No results for input(s): "TSH", "T4TOTAL", "T3FREE", "THYROIDAB" in the last 72 hours.  Invalid input(s): "FREET3" Anemia work up: No results for input(s): "VITAMINB12", "FOLATE", "FERRITIN", "TIBC", "IRON", "RETICCTPCT" in the last 72 hours. Sepsis Labs: Recent Labs  Lab 09/29/22 2008 09/29/22 2224  WBC 7.1 9.9    Microbiology No results found for this or any previous visit (from the past 240 hour(s)).  Procedures and diagnostic studies:  CT Angio Chest Pulmonary Embolism (PE) W or WO Contrast  Result Date: 09/30/2022 CLINICAL DATA:  Pulmonary embolism (PE) suspected, high prob. Wheezing and cough. EXAM: CT ANGIOGRAPHY CHEST WITH CONTRAST TECHNIQUE: Multidetector CT imaging of the chest was performed using the standard protocol during bolus administration of intravenous contrast. Multiplanar CT image reconstructions and MIPs were obtained to evaluate the vascular  anatomy. RADIATION DOSE REDUCTION: This exam was performed according to the departmental dose-optimization program which includes automated exposure control, adjustment of the mA and/or kV according to patient size and/or use of iterative reconstruction technique. CONTRAST:  80mL OMNIPAQUE IOHEXOL 350 MG/ML SOLN COMPARISON:  CTA chest 11/26/2017. FINDINGS: Cardiovascular: Satisfactory opacification of the pulmonary arteries to the segmental level. No evidence of pulmonary embolism. Normal heart size. No pericardial effusion.  Right-sided aortic arch with aberrant left subclavian artery. Mediastinum/Nodes: No suspicious thoracic lymphadenopathy. Small hiatal hernia. Lungs/Pleura: Diffuse bronchial wall thickening with scattered, patchy areas of consolidation throughout both lungs. No pleural effusion or pneumothorax. Upper Abdomen: No acute abnormality. Musculoskeletal: No chest wall abnormality. No acute or significant osseous findings. Review of the MIP images confirms the above findings. IMPRESSION: 1. No evidence of pulmonary embolism. 2. Diffuse bronchial wall thickening with scattered, patchy areas of consolidation throughout both lungs, consistent with multifocal pneumonia. 3. Right-sided aortic arch with aberrant left subclavian artery. Electronically Signed   By: Orvan Falconer M.D.   On: 09/30/2022 08:43   DG Chest Portable 1 View  Result Date: 09/29/2022 CLINICAL DATA:  Shortness of breath with wheezing EXAM: PORTABLE CHEST 1 VIEW COMPARISON:  Chest x-ray 11/03/2019.  CT angiogram chest 11/26/2017. FINDINGS: The heart size and mediastinal contours are within normal limits. Right-sided aortic arch again noted. Both lungs are clear. The visualized skeletal structures are unremarkable. IMPRESSION: No active disease. Electronically Signed   By: Darliss Cheney M.D.   On: 09/29/2022 20:11               LOS: 2 days   Zahid Carneiro  [facility]   Pager on [email or web address]. If 7PM-7AM,  please contact night-coverage at [email or web address]     10/01/2022, 5:40 PM

## 2022-10-01 NOTE — Progress Notes (Signed)
Chap visited visited Pt at bedside. Pt and family in the room. This Mirna Mires supplied active listening and a calming presence. Pt and family very appreciative.   10/01/22 1600  Spiritual Encounters  Type of Visit Initial  Care provided to: Pt and family  Referral source Chaplain assessment  Reason for visit Routine spiritual support  OnCall Visit No

## 2022-10-02 DIAGNOSIS — J189 Pneumonia, unspecified organism: Secondary | ICD-10-CM | POA: Diagnosis not present

## 2022-10-02 MED ORDER — IPRATROPIUM-ALBUTEROL 0.5-2.5 (3) MG/3ML IN SOLN: 3.0000 mL | Freq: Four times a day (QID) | RESPIRATORY_TRACT | Status: DC

## 2022-10-02 MED ORDER — SENNOSIDES-DOCUSATE SODIUM 8.6-50 MG PO TABS
1.0000 | ORAL_TABLET | Freq: Every evening | ORAL | Status: DC | PRN
  Administered 2022-10-02: 1 via ORAL
  Filled 2022-10-02: qty 1

## 2022-10-02 MED ORDER — POLYETHYLENE GLYCOL 3350 17 G PO PACK: 17.0000 g | PACK | Freq: Every day | ORAL | Status: DC | PRN

## 2022-10-02 MED ORDER — METHYLPREDNISOLONE SODIUM SUCC 40 MG IJ SOLR
40.0000 mg | Freq: Three times a day (TID) | INTRAMUSCULAR | Status: DC
  Administered 2022-10-02 – 2022-10-03 (×4): 40 mg via INTRAVENOUS
  Filled 2022-10-02 (×4): qty 1

## 2022-10-02 MED ORDER — POLYETHYLENE GLYCOL 3350 17 G PO PACK
17.0000 g | PACK | Freq: Every day | ORAL | Status: DC
  Administered 2022-10-02 – 2022-10-03 (×2): 17 g via ORAL
  Filled 2022-10-02 (×2): qty 1

## 2022-10-02 MED ORDER — BISACODYL 5 MG PO TBEC
10.0000 mg | DELAYED_RELEASE_TABLET | Freq: Once | ORAL | Status: AC
  Administered 2022-10-02: 10 mg via ORAL
  Filled 2022-10-02: qty 2

## 2022-10-02 MED ORDER — IPRATROPIUM-ALBUTEROL 0.5-2.5 (3) MG/3ML IN SOLN
3.0000 mL | Freq: Three times a day (TID) | RESPIRATORY_TRACT | Status: DC
  Administered 2022-10-02 – 2022-10-03 (×4): 3 mL via RESPIRATORY_TRACT
  Filled 2022-10-02 (×4): qty 3

## 2022-10-02 NOTE — Plan of Care (Signed)
  Problem: Education: Goal: Knowledge of disease or condition will improve Outcome: Progressing   Problem: Activity: Goal: Ability to tolerate increased activity will improve Outcome: Progressing   Problem: Respiratory: Goal: Ability to maintain a clear airway will improve Outcome: Not Progressing

## 2022-10-02 NOTE — Plan of Care (Signed)
  Problem: Education: Goal: Knowledge of disease or condition will improve Outcome: Progressing   Problem: Activity: Goal: Will verbalize the importance of balancing activity with adequate rest periods Outcome: Progressing   Problem: Nutrition: Goal: Adequate nutrition will be maintained Outcome: Progressing   Problem: Coping: Goal: Level of anxiety will decrease Outcome: Progressing   Problem: Nutrition: Goal: Adequate nutrition will be maintained Outcome: Progressing   Problem: Coping: Goal: Level of anxiety will decrease Outcome: Progressing   Problem: Activity: Goal: Ability to tolerate increased activity will improve Outcome: Not Progressing   Problem: Clinical Measurements: Goal: Respiratory complications will improve Outcome: Not Progressing   Problem: Elimination: Goal: Will not experience complications related to bowel motility Outcome: Not Progressing

## 2022-10-02 NOTE — Progress Notes (Addendum)
Occupational Therapy Treatment Patient Details Name: Lauren Koch MRN: [id number] DOB: 1950-12-07 Today's Date: 10/02/2022   History of present illness presented to ER secondary to progressive wheezing, cough; admitted for management of multifocal PNA   OT comments  Pt. was alert and watching a program on her IPad upon arrival.  Pt. education was provided about energy conservation/work simplification techniques for common ADLs, and IADL tasks. A visual handout was provided to the Pt.  Pt. education was provided about PLB techniques. Pt.'s SPO2 was 93%, HR 100 bpms on RA.  Pt. reports having no concerns regarding ADL/IADL functioning in anticipation of returning home, as Pt. reports that her daughter handles everything for her.Throughout the session, Pt. Appeared to be very eager to return to the program she was initially watching on the IPad.  Pt. Continues to benefit from OT services for ADL training, A/E training, and Pt. education about energy conservation/work simplification techniques, home modification, and DME.       If plan is discharge home, recommend the following:  A little help with walking and/or transfers;A little help with bathing/dressing/bathroom;Assist for transportation;Help with stairs or ramp for entrance;Assistance with cooking/housework;Direct supervision/assist for medications management;Direct supervision/assist for financial management   Equipment Recommendations  BSC/3in1    Recommendations for Other Services      Precautions / Restrictions Precautions Precautions: Fall Restrictions Weight Bearing Restrictions: No       Mobility Bed Mobility                    Transfers                   General transfer comment: Deferred     Balance                                           ADL either performed or assessed with clinical judgement   ADL                                         General ADL  Comments: Anticipate CGA for UE,a nd MinA LE ADLs    Extremity/Trunk Assessment              Vision       Perception     Praxis      Cognition Arousal: Alert Behavior During Therapy: WFL for tasks assessed/performed Overall Cognitive Status: Within Functional Limits for tasks assessed                                 General Comments: Pt. appeared distracted by what she was watching on her IPad        Exercises      Shoulder Instructions       General Comments      Pertinent Vitals/ Pain       Pain Assessment Pain Assessment: No/denies pain  Home Living                                          Prior Functioning/Environment              Frequency  Min 1X/week        Progress Toward Goals  OT Goals(current goals can now be found in the care plan section)  Progress towards OT goals: Progressing toward goals  Acute Rehab OT Goals Patient Stated Goal: To go home OT Goal Formulation: With patient Time For Goal Achievement: 10/14/22 Potential to Achieve Goals: Good  Plan      Co-evaluation                 AM-PAC OT "6 Clicks" Daily Activity     Outcome Measure   Help from another person eating meals?: None Help from another person taking care of personal grooming?: None Help from another person toileting, which includes using toliet, bedpan, or urinal?: A Little Help from another person bathing (including washing, rinsing, drying)?: A Lot Help from another person to put on and taking off regular upper body clothing?: A Little Help from another person to put on and taking off regular lower body clothing?: A Little 6 Click Score: 19    End of Session    OT Visit Diagnosis: Muscle weakness (generalized) (M62.81);Other abnormalities of gait and mobility (R26.89)   Activity Tolerance Patient tolerated treatment well   Patient Left in chair;with call bell/phone within reach;with chair alarm set;with  family/visitor present   Nurse Communication          Time: 1610-9604 OT Time Calculation (min): 13 min  Charges: OT General Charges $OT Visit: 1 Visit OT Treatments $Self Care/Home Management : 8-22 mins  Olegario Messier, MS, OTR/L   Olegario Messier 10/02/2022, 4:12 PM

## 2022-10-02 NOTE — TOC Initial Note (Addendum)
Transition of Care [facility]) - Initial/Assessment Note    Patient Details  Name: Lauren Koch MRN: [id number] Date of Birth: 1950/06/11  Transition of Care [facility]) CM/SW Contact:    Liliana Cline, LCSW Phone Number: 10/02/2022, 11:17 AM  Clinical Narrative:                 Met with patient and daughter at bedside.  Patient lives with daughter who provides transportation.  PCP is Adult nurse in [place]. Pharmacy is [facility].  Patient has a cane at home. Patient and daughter requested a RW, referral made to Selena Batten with [employer] for bedside delivery today - asked MD for RW order.  Patient had HH in the past and is aware of PT rec for HH at DC. Patient declines HH at this time, agreed to let staff know if she changes her mind prior to DC. She also agreed to let her PCP know if she decides she wants HH after she is DC.   11:35- Notified by MD that patient is not DC today. Kim with [employer] states they will have RW delivered to bedside tomorrow.   Expected Discharge Plan: Home/Self Care Barriers to Discharge: Continued Medical Work up   Patient Goals and CMS Choice Patient states their goals for this hospitalization and ongoing recovery are:: to return home with daughter CMS Medicare.gov Compare Post Acute Care list provided to:: Patient Choice offered to / list presented to : Patient      Expected Discharge Plan and Services       Living arrangements for the past 2 months: Single Family Home                 DME Arranged: Walker rolling DME Agency: [employer] Date DME Agency Contacted: 10/02/22   Representative spoke with at DME Agency: Selena Batten            Prior Living Arrangements/Services Living arrangements for the past 2 months: Single Family Home Lives with:: Relatives Patient language and need for interpreter reviewed:: Yes Do you feel safe going back to the place where you live?: Yes      Need for Family Participation in Patient Care: Yes (Comment) Care giver support  system in place?: Yes (comment) Current home services: DME Criminal Activity/Legal Involvement Pertinent to Current Situation/Hospitalization: No - Comment as needed  Activities of Daily Living Home Assistive Devices/Equipment: Oxygen ADL Screening (condition at time of admission) Patient's cognitive ability adequate to safely complete daily activities?: Yes Is the patient deaf or have difficulty hearing?: No Does the patient have difficulty seeing, even when wearing glasses/contacts?: No Does the patient have difficulty concentrating, remembering, or making decisions?: No Patient able to express need for assistance with ADLs?: Yes Does the patient have difficulty dressing or bathing?: Yes Independently performs ADLs?: No Communication: Independent Dressing (OT): Needs assistance Is this a change from baseline?: Pre-admission baseline Grooming: Independent Feeding: Independent Bathing: Needs assistance Is this a change from baseline?: Pre-admission baseline Toileting: Independent In/Out Bed: Independent Walks in Home: Independent Does the patient have difficulty walking or climbing stairs?: Yes Weakness of Legs: Both Weakness of Arms/Hands: None  Permission Sought/Granted Permission sought to share information with : Facility Medical sales representative, Family Supports Permission granted to share information with : Yes, Verbal Permission Granted     Permission granted to share info w AGENCY: DME        Emotional Assessment       Orientation: : Oriented to Situation, Oriented to Self, Oriented to Place,  Oriented to  Time Alcohol / Substance Use: Not Applicable Psych Involvement: No (comment)  Admission diagnosis:  SOB (shortness of breath) [R06.02] Wheezing [R06.2] Patient Active Problem List   Diagnosis Date Noted   Multifocal pneumonia 09/30/2022   Wheezing 09/29/2022   Upper respiratory tract infection 08/10/2019   COPD (chronic obstructive pulmonary disease) with  acute bronchitis (HCC) 04/03/2018   Physical deconditioning 12/21/2017   Urinary incontinence 12/21/2017   Dyspnea 11/25/2017   Tachycardia 11/25/2017   Memory loss 06/10/2017   Abnormal thyroid function test 06/10/2017   Breast cyst, left 06/10/2017   Hyperlipemia 03/25/2016   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 03/20/2016   Osteopenia 08/08/2015   Pulmonary emphysema (HCC)    Abnormal TSH 05/30/2015   Acute respiratory failure with hypoxia (HCC) 05/29/2015   COPD with acute exacerbation (HCC) 05/29/2015   Acute kidney injury (HCC) 05/29/2015   Morbid obesity (HCC) 05/29/2015   Right leg pain 05/29/2015   Essential hypertension 05/29/2015   Vitamin D deficiency 04/17/2015   Asthma    Cataract    COPD (chronic obstructive pulmonary disease) (HCC)    GERD (gastroesophageal reflux disease)    PCP:  Pcp, No Pharmacy:   [facility]) - [place], [place] - [address] [facility] [address] [place] [address] [place] [address] Phone: [phone] Fax: [phone]  [facility] [address], [place] - [address] [place] [address] Phone: [phone] Fax: [phone]  [facility] - [place], [place] - [address] [facility] [address] [place] [address] [place] [address] Phone: [phone] Fax: [phone]     Social Determinants of Health (SDOH) Social History: SDOH Screenings   Food Insecurity: No Food Insecurity (09/30/2022)  Housing: Patient Unable To Answer (09/30/2022)  Transportation Needs: Unknown (09/30/2022)  Utilities: Not At Risk (09/30/2022)  Depression (PHQ2-9): Low Risk  (08/17/2019)  Financial Resource Strain: Low Risk  (04/03/2018)  Physical Activity: Inactive (04/03/2018)  Social Connections: Socially Isolated (04/03/2018)  Stress: No Stress Concern Present (04/03/2018)  Tobacco Use: Medium Risk (09/29/2022)   SDOH Interventions:     Readmission Risk  Interventions    10/02/2022   11:16 AM  Readmission Risk Prevention Plan  Post Dischage Appt Complete  Medication Screening Complete  Transportation Screening Complete

## 2022-10-02 NOTE — Progress Notes (Addendum)
Progress Note    Lauren Koch  VFI:[id number] DOB: 12-07-1950  DOA: 09/29/2022 PCP: Pcp, No      Brief Narrative:    Medical records reviewed and are as summarized below:  Lauren Koch is a 72 y.o. female with medical history significant for hypertension, GERD, stroke, diastolic dysfunction, history of smoking, who presented to the hospital with cough, wheezing of about 2 to 3 days duration.      Assessment/Plan:   Principal Problem:   Multifocal pneumonia Active Problems:   Asthma   COPD with acute exacerbation (HCC)   GERD (gastroesophageal reflux disease)   Wheezing   Essential hypertension    Body mass index is 42.37 kg/m.  (Obesity)   Multifocal pneumonia: Discontinue IV azithromycin.  Continue IV cefepime. No evidence of PE on CT.  BNP 106.1.   COPD exacerbation: Continue IV Solu-Medrol and bronchodilators.   Acute hypoxic respiratory failure: Improved.  She is tolerating room air.     Other comorbidities include hypertension, chronic diastolic CHF, history of stroke   Diet Order             Diet regular Room service appropriate? No; Fluid consistency: Thin  Diet effective now                            Consultants: None  Procedures: None    Medications:    enoxaparin (LOVENOX) injection  45 mg Subcutaneous QHS   feeding supplement  237 mL Oral BID BM   hydrochlorothiazide  12.5 mg Oral Daily   ipratropium-albuterol  3 mL Nebulization TID   methylPREDNISolone (SOLU-MEDROL) injection  40 mg Intravenous Q8H   multivitamin with minerals  1 tablet Oral Daily   pantoprazole  40 mg Oral Daily   polyethylene glycol  17 g Oral Daily   sodium chloride flush  3 mL Intravenous Q12H   Continuous Infusions:  ceFEPime (MAXIPIME) IV 2 g (10/02/22 0929)     Anti-infectives (From admission, onward)    Start     Dose/Rate Route Frequency Ordered Stop   09/30/22 1300  azithromycin (ZITHROMAX) 500 mg in sodium chloride 0.9  % 250 mL IVPB        500 mg 250 mL/hr over 60 Minutes Intravenous Every 24 hours 09/30/22 1204 10/02/22 1312   09/29/22 2215  ceFEPIme (MAXIPIME) 2 g in sodium chloride 0.9 % 100 mL IVPB        2 g 200 mL/hr over 30 Minutes Intravenous Every 12 hours 09/29/22 2201 10/04/22 2159              Family Communication/Anticipated D/C date and plan/Code Status   DVT prophylaxis:      Code Status: Full Code  Family Communication: Plan discussed with Tabitha, daughter, at the bedside Disposition Plan: Plan to discharge home tomorrow   Status is: Inpatient Remains inpatient appropriate because: Pneumonia, COPD exacerbation       Subjective:   No acute events overnight.  She complains of cough.  She is still wheezing.  No shortness of breath or chest pain.  Wyatt Mage, daughter, was at the bedside  Objective:    Vitals:   10/02/22 0754 10/02/22 0811 10/02/22 1201 10/02/22 1326  BP:  (!) 148/87 (!) 143/69   Pulse:  98 88   Resp:  (!) 22 20   Temp:  98.3 F (36.8 C) 98.2 F (36.8 C)   TempSrc:  Oral Oral  SpO2: 95% 96% 95% 96%  Weight:      Height:       No data found.   Intake/Output Summary (Last 24 hours) at 10/02/2022 1535 Last data filed at 10/02/2022 0600 Gross per 24 hour  Intake 480 ml  Output --  Net 480 ml   Filed Weights   09/29/22 1923 10/02/22 0500  Weight: 90.7 kg 98.4 kg    Exam:  GEN: NAD SKIN: Warm and dry EYES: No pallor or icterus ENT: MMM CV: RRR PULM: Diffuse expiratory wheezing ABD: soft, ND, NT, +BS CNS: AAO x 3, non focal EXT: No edema or tenderness        Data Reviewed:   I have personally reviewed following labs and imaging studies:  Labs: Labs show the following:   Basic Metabolic Panel: Recent Labs  Lab 09/29/22 2008 09/29/22 2224  NA 140  --   K 3.8  --   CL 105  --   CO2 27  --   GLUCOSE 117*  --   BUN 13  --   CREATININE 0.88 0.89  CALCIUM 8.6*  --    GFR Estimated Creatinine Clearance: 61  mL/min (by C-G formula based on SCr of 0.89 mg/dL). Liver Function Tests: Recent Labs  Lab 09/29/22 2008  AST 21  ALT 15  ALKPHOS 95  BILITOT 0.5  PROT 7.6  ALBUMIN 3.6   No results for input(s): "LIPASE", "AMYLASE" in the last 168 hours. No results for input(s): "AMMONIA" in the last 168 hours. Coagulation profile No results for input(s): "INR", "PROTIME" in the last 168 hours.  CBC: Recent Labs  Lab 09/29/22 2008 09/29/22 2224  WBC 7.1 9.9  NEUTROABS 4.5  --   HGB 15.3* 14.7  HCT 45.3 44.6  MCV 85.8 86.8  PLT 259 268   Cardiac Enzymes: No results for input(s): "CKTOTAL", "CKMB", "CKMBINDEX", "TROPONINI" in the last 168 hours. BNP (last 3 results) No results for input(s): "PROBNP" in the last 8760 hours. CBG: No results for input(s): "GLUCAP" in the last 168 hours. D-Dimer: Recent Labs    09/29/22 2224  DDIMER 0.58*   Hgb A1c: Recent Labs    09/29/22 2224  HGBA1C 5.4   Lipid Profile: No results for input(s): "CHOL", "HDL", "LDLCALC", "TRIG", "CHOLHDL", "LDLDIRECT" in the last 72 hours. Thyroid function studies: No results for input(s): "TSH", "T4TOTAL", "T3FREE", "THYROIDAB" in the last 72 hours.  Invalid input(s): "FREET3" Anemia work up: No results for input(s): "VITAMINB12", "FOLATE", "FERRITIN", "TIBC", "IRON", "RETICCTPCT" in the last 72 hours. Sepsis Labs: Recent Labs  Lab 09/29/22 2008 09/29/22 2224  WBC 7.1 9.9    Microbiology No results found for this or any previous visit (from the past 240 hour(s)).  Procedures and diagnostic studies:  No results found.             LOS: 3 days   Lauren Koch  [facility]   Pager on [email or web address]. If 7PM-7AM, please contact night-coverage at [email or web address]     10/02/2022, 3:35 PM

## 2022-10-03 DIAGNOSIS — J189 Pneumonia, unspecified organism: Secondary | ICD-10-CM | POA: Diagnosis not present

## 2022-10-03 LAB — CBC
HCT: 43.4 % (ref 36.0–46.0)
Hemoglobin: 14.7 g/dL (ref 12.0–15.0)
MCH: 28.8 pg (ref 26.0–34.0)
MCHC: 33.9 g/dL (ref 30.0–36.0)
MCV: 84.9 fL (ref 80.0–100.0)
Platelets: 323 10*3/uL (ref 150–400)
RBC: 5.11 MIL/uL (ref 3.87–5.11)
RDW: 14.3 % (ref 11.5–15.5)
WBC: 9.1 10*3/uL (ref 4.0–10.5)
nRBC: 0 % (ref 0.0–0.2)

## 2022-10-03 MED ORDER — LISINOPRIL 10 MG PO TABS
10.0000 mg | ORAL_TABLET | Freq: Every day | ORAL | 0 refills | Status: AC
Start: 2022-10-03 — End: ?

## 2022-10-03 MED ORDER — DEXAMETHASONE 6 MG PO TABS: 6.0000 mg | ORAL_TABLET | Freq: Every day | ORAL | 0 refills | Status: AC

## 2022-10-03 MED ORDER — HYDROCHLOROTHIAZIDE 12.5 MG PO CAPS: 12.5000 mg | ORAL_CAPSULE | Freq: Every day | ORAL | 0 refills | Status: AC

## 2022-10-03 NOTE — Progress Notes (Signed)
Physical Therapy Treatment Patient Details Name: Lauren Koch MRN: [id number] DOB: 12/02/1950 Today's Date: 10/03/2022   History of Present Illness presented to ER secondary to progressive wheezing, cough; admitted for management of multifocal PNA    PT Comments  Treatment today focusing on improving overall activity tolerance and pacing for safety with mobility at DC. Pt's daughter notes that pt has been IND ambulating to/from bathroom with RW.  Pt tolerated treatment well today and was able to improve overall assist levels, ambulation distance, and activity tolerance since last session. Today, she was supervision for safety with transfers in RW and CGA to ambulate 58ft. Despite wheezing, pt able to maintain SpO2 at therapeutic level during and after functional activity with SpO2 reading at 100% and HR elevated to 100bpm.   Despite progress, pt continues to be limited with meeting goals due to poor safety awareness, decreased activity tolerance, and decreased standing balance. Further mobility deferred for energy conservation as pt is pending DC today; encouraged for continued ambulation to/from bathroom with RW for energy conservation and safety. Will continue per POC.     If plan is discharge home, recommend the following: A little help with walking and/or transfers;A little help with bathing/dressing/bathroom;Direct supervision/assist for financial management;Assistance with cooking/housework;Assist for transportation;Direct supervision/assist for medications management;Help with stairs or ramp for entrance     Equipment Recommendations  None recommended by PT       Precautions / Restrictions Restrictions Weight Bearing Restrictions: No     Mobility  Bed Mobility               General bed mobility comments: seated EOB upon entry with daughter at bedside    Transfers Overall transfer level: Needs assistance Equipment used: Rolling walker (2 wheels) Transfers: Sit to/from  Stand Sit to Stand: Supervision           General transfer comment: supervision for safety to perform STS at EOB with RW    Ambulation/Gait Ambulation/Gait assistance: Contact guard assist Gait Distance (Feet): 50 Feet           General Gait Details: CGA to ambulate in room with RW. Demonstrates improved quality of gait with early reciprocal gait, decreased UE WB on RW, and SpO2 at 100% on RA despite labored breathing. Minimal cueing for RW proximity.     Balance Overall balance assessment: Needs assistance Sitting-balance support: No upper extremity supported, Feet supported Sitting balance-Leahy Scale: Good     Standing balance support: Bilateral upper extremity supported Standing balance-Leahy Scale: Fair Standing balance comment: standing balance w/ RW                            Cognition Arousal: Alert Behavior During Therapy: WFL for tasks assessed/performed Overall Cognitive Status: Within Functional Limits for tasks assessed                                                 Pertinent Vitals/Pain Pain Assessment Pain Assessment: No/denies pain     PT Goals (current goals can now be found in the care plan section) Acute Rehab PT Goals Patient Stated Goal: to get better and go home PT Goal Formulation: With patient/family Time For Goal Achievement: 10/14/22 Potential to Achieve Goals: Good Progress towards PT goals: Progressing toward goals    Frequency  Min 1X/week              AM-PAC PT "6 Clicks" Mobility   Outcome Measure  Help needed turning from your back to your side while in a flat bed without using bedrails?: None Help needed moving from lying on your back to sitting on the side of a flat bed without using bedrails?: None Help needed moving to and from a bed to a chair (including a wheelchair)?: A Little Help needed standing up from a chair using your arms (e.g., wheelchair or bedside chair)?: A  Little Help needed to walk in hospital room?: A Little Help needed climbing 3-5 steps with a railing? : A Little 6 Click Score: 20    End of Session Equipment Utilized During Treatment: Gait belt Activity Tolerance: Patient tolerated treatment well Patient left: in bed;with call bell/phone within reach;with family/visitor present (bed alarm left off, as pt's daughter states pt has been ambulatory to/from bathroom with RW) Nurse Communication: Mobility status PT Visit Diagnosis: Muscle weakness (generalized) (M62.81);Difficulty in walking, not elsewhere classified (R26.2)     Time: 6045-4098 PT Time Calculation (min) (ACUTE ONLY): 11 min  Charges:    $Therapeutic Exercise: 8-22 mins PT General Charges $$ ACUTE PT VISIT: 1 Visit                      Vira Blanco, PT, DPT 2:51 PM,10/03/22 Physical Therapist -  [facility]

## 2022-10-03 NOTE — Progress Notes (Signed)
CSW spoke with Selena Batten with [employer] who stated patients walker is out for delivery this morning.

## 2022-10-03 NOTE — Discharge Summary (Signed)
Physician Discharge Summary   Patient: Lauren Koch MRN: [id number] DOB: 1950-05-31  Admit date:     09/29/2022  Discharge date: 10/03/22  Discharge Physician: Lurene Shadow   PCP: Pcp, No   Recommendations at discharge:   Follow-up with PCP in 1 week  Discharge Diagnoses: Principal Problem:   Multifocal pneumonia Active Problems:   Asthma   COPD with acute exacerbation (HCC)   GERD (gastroesophageal reflux disease)   Wheezing   Essential hypertension  Resolved Problems:   * No resolved hospital problems. *  Hospital Course:  Lauren Koch is a 72 y.o. female with medical history significant for hypertension, GERD, stroke, diastolic dysfunction, history of smoking, who presented to the hospital with cough, wheezing of about 2 to 3 days duration.   She was found to have multifocal pneumonia, COPD exacerbation and acute hypoxic respiratory failure.  She was treated with empiric IV antibiotics, steroids, bronchodilators and oxygen therapy.   Assessment and Plan:   Multifocal pneumonia: She was treated with IV cefepime and IV azithromycin.  Completed antibiotics. No evidence of PE on CT.  BNP 106.1.     COPD exacerbation: Improved.  She will be discharged on oral dexamethasone.  Continue bronchodilators as needed.     Acute hypoxic respiratory failure: Resolved.  She is tolerating room air.  She is able to ambulate without any respiratory distress.     Other comorbidities include hypertension, chronic diastolic CHF, history of stroke     Her condition is improved and she is deemed stable for discharge to home today.  Discharge plan was discussed with the patient and Lauren Koch, daughter over the phone.        Consultants: None Procedures performed: None Disposition: Home Diet recommendation:  Discharge Diet Orders (From admission, onward)     Start     Ordered   10/03/22 0000  Diet - low sodium heart healthy        10/03/22 1516           Cardiac  diet DISCHARGE MEDICATION: Allergies as of 10/03/2022       Reactions   Prednisone Rash, Other (See Comments)   Arrhythmia, also (Has tolerated hydrocortisone and decadron)        Medication List     STOP taking these medications    erythromycin ophthalmic ointment   ezetimibe 10 MG tablet Commonly known as: Zetia       TAKE these medications    albuterol 108 (90 Base) MCG/ACT inhaler Commonly known as: VENTOLIN HFA Inhale 1-2 puffs into the lungs every 6 (six) hours as needed for wheezing or shortness of breath.   dexamethasone 6 MG tablet Commonly known as: DECADRON Take 1 tablet (6 mg total) by mouth daily for 3 days. Start taking on: October 04, 2022 What changed:  medication strength how much to take how to take this when to take this additional instructions   hydrochlorothiazide 12.5 MG capsule Commonly known as: MICROZIDE Take 1 capsule (12.5 mg total) by mouth daily.   ibuprofen 200 MG tablet Commonly known as: ADVIL Take 200 mg by mouth every 6 (six) hours as needed for fever or moderate pain.   ipratropium-albuterol 0.5-2.5 (3) MG/3ML Soln Commonly known as: DUONEB Take 3 mLs by nebulization every 6 (six) hours as needed (for wheezing/shortness of breath).   lisinopril 10 MG tablet Commonly known as: ZESTRIL Take 1 tablet (10 mg total) by mouth daily. Not reduced dose 10 mg so take 1 pill not 1/2  pill   Trelegy Ellipta 100-62.5-25 MCG/INH Aepb Generic drug: Fluticasone-Umeclidin-Vilant Inhale 1 puff into the lungs daily. Rinse mouth               Durable Medical Equipment  (From admission, onward)           Start     Ordered   10/02/22 0000  For home use only DME Walker rolling       Question Answer Comment  Walker: With 5 Inch Wheels   Patient needs a walker to treat with the following condition General weakness      10/02/22 1135            Discharge Exam: Filed Weights   09/29/22 1923 10/02/22 0500 10/03/22 0759   Weight: 90.7 kg 98.4 kg 98.6 kg   GEN: NAD SKIN: Warm and dry EYES: No pallor or icterus ENT: MMM CV: RRR PULM: Improved air entry bilaterally.  Bilateral expiratory wheezing, no rales ABD: soft, obese, NT, +BS CNS: AAO x 3, non focal EXT: No edema or tenderness   Condition at discharge: good  The results of significant diagnostics from this hospitalization (including imaging, microbiology, ancillary and laboratory) are listed below for reference.   Imaging Studies: CT Angio Chest Pulmonary Embolism (PE) W or WO Contrast  Result Date: 09/30/2022 CLINICAL DATA:  Pulmonary embolism (PE) suspected, high prob. Wheezing and cough. EXAM: CT ANGIOGRAPHY CHEST WITH CONTRAST TECHNIQUE: Multidetector CT imaging of the chest was performed using the standard protocol during bolus administration of intravenous contrast. Multiplanar CT image reconstructions and MIPs were obtained to evaluate the vascular anatomy. RADIATION DOSE REDUCTION: This exam was performed according to the departmental dose-optimization program which includes automated exposure control, adjustment of the mA and/or kV according to patient size and/or use of iterative reconstruction technique. CONTRAST:  80mL OMNIPAQUE IOHEXOL 350 MG/ML SOLN COMPARISON:  CTA chest 11/26/2017. FINDINGS: Cardiovascular: Satisfactory opacification of the pulmonary arteries to the segmental level. No evidence of pulmonary embolism. Normal heart size. No pericardial effusion. Right-sided aortic arch with aberrant left subclavian artery. Mediastinum/Nodes: No suspicious thoracic lymphadenopathy. Small hiatal hernia. Lungs/Pleura: Diffuse bronchial wall thickening with scattered, patchy areas of consolidation throughout both lungs. No pleural effusion or pneumothorax. Upper Abdomen: No acute abnormality. Musculoskeletal: No chest wall abnormality. No acute or significant osseous findings. Review of the MIP images confirms the above findings. IMPRESSION: 1. No  evidence of pulmonary embolism. 2. Diffuse bronchial wall thickening with scattered, patchy areas of consolidation throughout both lungs, consistent with multifocal pneumonia. 3. Right-sided aortic arch with aberrant left subclavian artery. Electronically Signed   By: Orvan Falconer M.D.   On: 09/30/2022 08:43   DG Chest Portable 1 View  Result Date: 09/29/2022 CLINICAL DATA:  Shortness of breath with wheezing EXAM: PORTABLE CHEST 1 VIEW COMPARISON:  Chest x-ray 11/03/2019.  CT angiogram chest 11/26/2017. FINDINGS: The heart size and mediastinal contours are within normal limits. Right-sided aortic arch again noted. Both lungs are clear. The visualized skeletal structures are unremarkable. IMPRESSION: No active disease. Electronically Signed   By: Darliss Cheney M.D.   On: 09/29/2022 20:11    Microbiology: Results for orders placed or performed during the hospital encounter of 11/03/19  Respiratory Panel by RT PCR (Flu A&B, Covid) - Nasopharyngeal Swab     Status: None   Collection Time: 11/03/19  5:48 AM   Specimen: Nasopharyngeal Swab  Result Value Ref Range Status   SARS Coronavirus 2 by RT PCR NEGATIVE NEGATIVE Final    Comment: (  NOTE) SARS-CoV-2 target nucleic acids are NOT DETECTED.  The SARS-CoV-2 RNA is generally detectable in upper respiratoy specimens during the acute phase of infection. The lowest concentration of SARS-CoV-2 viral copies this assay can detect is 131 copies/mL. A negative result does not preclude SARS-Cov-2 infection and should not be used as the sole basis for treatment or other patient management decisions. A negative result may occur with  improper specimen collection/handling, submission of specimen other than nasopharyngeal swab, presence of viral mutation(s) within the areas targeted by this assay, and inadequate number of viral copies (<131 copies/mL). A negative result must be combined with clinical observations, patient history, and epidemiological  information. The expected result is Negative.  Fact Sheet for Patients:  https://www.moore.com/  Fact Sheet for Healthcare Providers:  https://www.young.biz/  This test is no t yet approved or cleared by the [place] FDA and  has been authorized for detection and/or diagnosis of SARS-CoV-2 by FDA under an Emergency Use Authorization (EUA). This EUA will remain  in effect (meaning this test can be used) for the duration of the COVID-19 declaration under Section 564(b)(1) of the Act, 21 U.S.C. section 360bbb-3(b)(1), unless the authorization is terminated or revoked sooner.     Influenza A by PCR NEGATIVE NEGATIVE Final   Influenza B by PCR NEGATIVE NEGATIVE Final    Comment: (NOTE) The Xpert Xpress SARS-CoV-2/FLU/RSV assay is intended as an aid in  the diagnosis of influenza from Nasopharyngeal swab specimens and  should not be used as a sole basis for treatment. Nasal washings and  aspirates are unacceptable for Xpert Xpress SARS-CoV-2/FLU/RSV  testing.  Fact Sheet for Patients: https://www.moore.com/  Fact Sheet for Healthcare Providers: https://www.young.biz/  This test is not yet approved or cleared by the [place] FDA and  has been authorized for detection and/or diagnosis of SARS-CoV-2 by  FDA under an Emergency Use Authorization (EUA). This EUA will remain  in effect (meaning this test can be used) for the duration of the  Covid-19 declaration under Section 564(b)(1) of the Act, 21  U.S.C. section 360bbb-3(b)(1), unless the authorization is  terminated or revoked. Performed at [facility], [address] [place], [place] [address]     Labs: CBC: Recent Labs  Lab 09/29/22 2008 09/29/22 2224 10/03/22 0451  WBC 7.1 9.9 9.1  NEUTROABS 4.5  --   --   HGB 15.3* 14.7 14.7  HCT 45.3 44.6 43.4  MCV 85.8 86.8 84.9  PLT 259 268 323   Basic Metabolic Panel: Recent  Labs  Lab 09/29/22 2008 09/29/22 2224  NA 140  --   K 3.8  --   CL 105  --   CO2 27  --   GLUCOSE 117*  --   BUN 13  --   CREATININE 0.88 0.89  CALCIUM 8.6*  --    Liver Function Tests: Recent Labs  Lab 09/29/22 2008  AST 21  ALT 15  ALKPHOS 95  BILITOT 0.5  PROT 7.6  ALBUMIN 3.6   CBG: No results for input(s): "GLUCAP" in the last 168 hours.  Discharge time spent: greater than 30 minutes.  Signed: Lurene Shadow, MD [facility] 10/03/2022

## 2023-03-14 DIAGNOSIS — K432 Incisional hernia without obstruction or gangrene: Secondary | ICD-10-CM | POA: Diagnosis not present

## 2023-03-14 DIAGNOSIS — J449 Chronic obstructive pulmonary disease, unspecified: Secondary | ICD-10-CM | POA: Diagnosis not present

## 2023-03-14 DIAGNOSIS — J9621 Acute and chronic respiratory failure with hypoxia: Secondary | ICD-10-CM | POA: Diagnosis not present

## 2023-03-14 DIAGNOSIS — R531 Weakness: Secondary | ICD-10-CM | POA: Diagnosis not present

## 2023-04-11 DIAGNOSIS — K432 Incisional hernia without obstruction or gangrene: Secondary | ICD-10-CM | POA: Diagnosis not present

## 2023-04-11 DIAGNOSIS — J9621 Acute and chronic respiratory failure with hypoxia: Secondary | ICD-10-CM | POA: Diagnosis not present

## 2023-04-11 DIAGNOSIS — J449 Chronic obstructive pulmonary disease, unspecified: Secondary | ICD-10-CM | POA: Diagnosis not present

## 2023-04-11 DIAGNOSIS — R531 Weakness: Secondary | ICD-10-CM | POA: Diagnosis not present

## 2023-05-12 DIAGNOSIS — K432 Incisional hernia without obstruction or gangrene: Secondary | ICD-10-CM | POA: Diagnosis not present

## 2023-05-12 DIAGNOSIS — J9621 Acute and chronic respiratory failure with hypoxia: Secondary | ICD-10-CM | POA: Diagnosis not present

## 2023-05-12 DIAGNOSIS — J449 Chronic obstructive pulmonary disease, unspecified: Secondary | ICD-10-CM | POA: Diagnosis not present

## 2023-05-12 DIAGNOSIS — R531 Weakness: Secondary | ICD-10-CM | POA: Diagnosis not present

## 2023-06-11 DIAGNOSIS — J9621 Acute and chronic respiratory failure with hypoxia: Secondary | ICD-10-CM | POA: Diagnosis not present

## 2023-06-11 DIAGNOSIS — J449 Chronic obstructive pulmonary disease, unspecified: Secondary | ICD-10-CM | POA: Diagnosis not present

## 2023-06-11 DIAGNOSIS — R531 Weakness: Secondary | ICD-10-CM | POA: Diagnosis not present

## 2023-06-11 DIAGNOSIS — K432 Incisional hernia without obstruction or gangrene: Secondary | ICD-10-CM | POA: Diagnosis not present

## 2023-07-12 DIAGNOSIS — R531 Weakness: Secondary | ICD-10-CM | POA: Diagnosis not present

## 2023-07-12 DIAGNOSIS — J9621 Acute and chronic respiratory failure with hypoxia: Secondary | ICD-10-CM | POA: Diagnosis not present

## 2023-07-12 DIAGNOSIS — J449 Chronic obstructive pulmonary disease, unspecified: Secondary | ICD-10-CM | POA: Diagnosis not present

## 2023-07-12 DIAGNOSIS — K432 Incisional hernia without obstruction or gangrene: Secondary | ICD-10-CM | POA: Diagnosis not present

## 2023-08-11 DIAGNOSIS — J9621 Acute and chronic respiratory failure with hypoxia: Secondary | ICD-10-CM | POA: Diagnosis not present

## 2023-08-11 DIAGNOSIS — R531 Weakness: Secondary | ICD-10-CM | POA: Diagnosis not present

## 2023-08-11 DIAGNOSIS — J449 Chronic obstructive pulmonary disease, unspecified: Secondary | ICD-10-CM | POA: Diagnosis not present

## 2023-08-11 DIAGNOSIS — K432 Incisional hernia without obstruction or gangrene: Secondary | ICD-10-CM | POA: Diagnosis not present

## 2023-09-11 DIAGNOSIS — J9621 Acute and chronic respiratory failure with hypoxia: Secondary | ICD-10-CM | POA: Diagnosis not present

## 2023-09-11 DIAGNOSIS — J449 Chronic obstructive pulmonary disease, unspecified: Secondary | ICD-10-CM | POA: Diagnosis not present

## 2023-09-11 DIAGNOSIS — R531 Weakness: Secondary | ICD-10-CM | POA: Diagnosis not present

## 2023-09-11 DIAGNOSIS — K432 Incisional hernia without obstruction or gangrene: Secondary | ICD-10-CM | POA: Diagnosis not present

## 2023-10-12 DIAGNOSIS — K432 Incisional hernia without obstruction or gangrene: Secondary | ICD-10-CM | POA: Diagnosis not present

## 2023-10-12 DIAGNOSIS — J9621 Acute and chronic respiratory failure with hypoxia: Secondary | ICD-10-CM | POA: Diagnosis not present

## 2023-10-12 DIAGNOSIS — R531 Weakness: Secondary | ICD-10-CM | POA: Diagnosis not present

## 2023-10-12 DIAGNOSIS — J449 Chronic obstructive pulmonary disease, unspecified: Secondary | ICD-10-CM | POA: Diagnosis not present

## 2023-11-11 DIAGNOSIS — J9621 Acute and chronic respiratory failure with hypoxia: Secondary | ICD-10-CM | POA: Diagnosis not present

## 2023-11-11 DIAGNOSIS — R531 Weakness: Secondary | ICD-10-CM | POA: Diagnosis not present

## 2023-11-11 DIAGNOSIS — K432 Incisional hernia without obstruction or gangrene: Secondary | ICD-10-CM | POA: Diagnosis not present

## 2023-11-11 DIAGNOSIS — J449 Chronic obstructive pulmonary disease, unspecified: Secondary | ICD-10-CM | POA: Diagnosis not present

## 2023-12-12 DIAGNOSIS — K432 Incisional hernia without obstruction or gangrene: Secondary | ICD-10-CM | POA: Diagnosis not present

## 2023-12-12 DIAGNOSIS — J9621 Acute and chronic respiratory failure with hypoxia: Secondary | ICD-10-CM | POA: Diagnosis not present

## 2023-12-12 DIAGNOSIS — R531 Weakness: Secondary | ICD-10-CM | POA: Diagnosis not present

## 2024-01-11 DIAGNOSIS — K432 Incisional hernia without obstruction or gangrene: Secondary | ICD-10-CM | POA: Diagnosis not present

## 2024-01-11 DIAGNOSIS — J9621 Acute and chronic respiratory failure with hypoxia: Secondary | ICD-10-CM | POA: Diagnosis not present

## 2024-01-11 DIAGNOSIS — R531 Weakness: Secondary | ICD-10-CM | POA: Diagnosis not present
